# Patient Record
Sex: Female | Born: 1940 | Race: White | Hispanic: No | Smoking: Former smoker
Health system: Southern US, Community
[De-identification: ages and names within clinical notes are randomized; demographics above are authoritative.]

## PROBLEM LIST (undated history)

## (undated) DIAGNOSIS — R002 Palpitations: Secondary | ICD-10-CM

## (undated) DIAGNOSIS — J449 Chronic obstructive pulmonary disease, unspecified: Secondary | ICD-10-CM

## (undated) DIAGNOSIS — T7840XA Allergy, unspecified, initial encounter: Secondary | ICD-10-CM

## (undated) DIAGNOSIS — M8580 Other specified disorders of bone density and structure, unspecified site: Secondary | ICD-10-CM

## (undated) HISTORY — DX: Palpitations: R00.2

## (undated) HISTORY — DX: Chronic obstructive pulmonary disease, unspecified: J44.9

---

## 2005-02-06 ENCOUNTER — Encounter: Admission: RE | Admit: 2005-02-06 | Discharge: 2005-02-06 | Payer: Self-pay | Admitting: Internal Medicine

## 2005-03-30 ENCOUNTER — Ambulatory Visit (HOSPITAL_COMMUNITY): Admission: RE | Admit: 2005-03-30 | Discharge: 2005-03-30 | Payer: Self-pay | Admitting: Gastroenterology

## 2005-03-30 ENCOUNTER — Encounter (INDEPENDENT_AMBULATORY_CARE_PROVIDER_SITE_OTHER): Payer: Self-pay | Admitting: Specialist

## 2007-07-26 ENCOUNTER — Encounter: Admission: RE | Admit: 2007-07-26 | Discharge: 2007-07-26 | Payer: Self-pay | Admitting: Internal Medicine

## 2008-03-31 ENCOUNTER — Encounter: Admission: RE | Admit: 2008-03-31 | Discharge: 2008-03-31 | Payer: Self-pay | Admitting: Internal Medicine

## 2008-04-27 ENCOUNTER — Encounter: Admission: RE | Admit: 2008-04-27 | Discharge: 2008-04-27 | Payer: Self-pay | Admitting: Internal Medicine

## 2008-05-04 ENCOUNTER — Encounter: Admission: RE | Admit: 2008-05-04 | Discharge: 2008-05-04 | Payer: Self-pay | Admitting: Internal Medicine

## 2009-08-12 ENCOUNTER — Encounter: Admission: RE | Admit: 2009-08-12 | Discharge: 2009-08-12 | Payer: Self-pay | Admitting: Internal Medicine

## 2010-11-23 ENCOUNTER — Ambulatory Visit (HOSPITAL_COMMUNITY)
Admission: RE | Admit: 2010-11-23 | Discharge: 2010-11-23 | Payer: Self-pay | Source: Home / Self Care | Admitting: Internal Medicine

## 2011-01-09 ENCOUNTER — Encounter: Payer: Self-pay | Admitting: Internal Medicine

## 2011-03-22 ENCOUNTER — Encounter (HOSPITAL_COMMUNITY): Payer: Self-pay

## 2011-03-28 ENCOUNTER — Encounter (HOSPITAL_COMMUNITY): Payer: Self-pay

## 2011-03-31 ENCOUNTER — Encounter (HOSPITAL_COMMUNITY): Payer: Self-pay

## 2011-05-05 NOTE — Op Note (Signed)
[name], [name]            ACCOUNT NO.:  [id number]   MEDICAL RECORD NO.:  [id number]          PATIENT TYPE:  AMB   LOCATION:  ENDO                         FACILITY:  [facility]   PHYSICIAN:  Danise Edge, M.D.   DATE OF BIRTH:  1941-05-31   DATE OF PROCEDURE:  03/30/2005  DATE OF DISCHARGE:                                 OPERATIVE REPORT   PROCEDURE:  Colonoscopy and polypectomy.   INDICATIONS:  Ms. Virginia [name] is a 70 year old female, born Apr 27, 1941.  Virginia [name] is scheduled to undergo her first screening colonoscopy  with polypectomy to prevent colon cancer.   ENDOSCOPIST:  Dr. Reece Agar   PREMEDICATION:  1.  Versed 7 mg.  2.  Demerol 70 mg.   PROCEDURE:  After obtaining informed consent, Ms. Virginia [name] was placed in the  left lateral decubitus position.  I administered intravenous Demerol and  intravenous Versed to achieve conscious sedation for the procedure.  The  patient's blood pressure, oxygen saturation, and cardiac rhythm were  monitored throughout the procedure and documented in the medical record.   Anal inspection and digital rectal exam were normal.  The Olympus adjustable  pediatric colonoscope was introduced into the rectum and advanced to the  cecum.  A normal-appearing ileocecal valve and appendiceal orifice were  identified.  Colonic preparation exam today was excellent.   Rectum normal.  Retroflex view of the distal rectum reveals large,  nonbleeding internal hemorrhoids.  Sigmoid colon and descending colon.  At 30 cm from the anal verge, a 3 mm  sessile polyp was removed with the electrocautery snare.  Splenic flexure normal.  Transverse colon normal.  Hepatic flexure normal.  Ascending colon normal.  Cecum and ileocecal valve normal.   ASSESSMENT:  A small polyp was removed from the distal sigmoid colon;  otherwise, normal screening proctocolonoscopy to the cecum.   RECOMMENDATIONS:  If sigmoid polyp returns neoplastic  pathologically, [name] should undergo a repeat colonoscopy in 5 years.      MJ/MEDQ  D:  03/30/2005  T:  03/30/2005  Job:  [id number]   cc:   Wilson Singer, M.D.  [address]  [place]  [place] [address]  Fax: [phone]

## 2011-07-13 ENCOUNTER — Other Ambulatory Visit: Payer: Self-pay | Admitting: Internal Medicine

## 2011-08-16 ENCOUNTER — Ambulatory Visit
Admission: RE | Admit: 2011-08-16 | Discharge: 2011-08-16 | Disposition: A | Discharge status: Home / Self Care | Payer: Medicare Other | Source: Ambulatory Visit | Attending: Internal Medicine | Admitting: Internal Medicine

## 2012-03-12 ENCOUNTER — Emergency Department (HOSPITAL_COMMUNITY)
Admission: EM | Admit: 2012-03-12 | Discharge: 2012-03-12 | Disposition: A | Discharge status: Home / Self Care | Payer: Medicare Other | Source: Home / Self Care | Attending: Emergency Medicine | Admitting: Emergency Medicine

## 2012-03-12 ENCOUNTER — Encounter (HOSPITAL_COMMUNITY): Payer: Self-pay | Admitting: *Deleted

## 2012-03-12 DIAGNOSIS — H8309 Labyrinthitis, unspecified ear: Secondary | ICD-10-CM

## 2012-03-12 HISTORY — DX: Other specified disorders of bone density and structure, unspecified site: M85.80

## 2012-03-12 HISTORY — DX: Allergy, unspecified, initial encounter: T78.40XA

## 2012-03-12 MED ORDER — ONDANSETRON 4 MG PO TBDP
ORAL_TABLET | ORAL | Status: AC
  Filled 2012-03-12: qty 1

## 2012-03-12 MED ORDER — MECLIZINE HCL 12.5 MG PO TABS: 12.5000 mg | ORAL_TABLET | Freq: Three times a day (TID) | ORAL | Status: AC | PRN

## 2012-03-12 MED ORDER — ONDANSETRON HCL 4 MG PO TABS: 4.0000 mg | ORAL_TABLET | Freq: Three times a day (TID) | ORAL | Status: AC | PRN

## 2012-03-12 MED ORDER — ONDANSETRON 4 MG PO TBDP
4.0000 mg | ORAL_TABLET | Freq: Once | ORAL | Status: AC
  Administered 2012-03-12: 4 mg via ORAL

## 2012-03-12 NOTE — ED Notes (Signed)
Sudden onset at 1000 today pt reported dizziness, then 15 minutes later she started with N and V--4 times.  She denies fever.  She did have a loose stool today.

## 2012-03-12 NOTE — Discharge Instructions (Signed)
Labyrinthitis (Inner Ear Inflammation) Your exam shows you have an inner ear disturbance or labyrinthitis. The cause of this condition is not known. But it may be due to a virus infection. The symptoms of labyrinthitis include vertigo or dizziness made worse by motion, nausea and vomiting. The onset of labyrinthitis may be very sudden. It usually lasts for a few days and then clears up over 1-2 weeks. The treatment of an inner ear disturbance includes bed rest and medications to reduce dizziness, nausea, and vomiting. You should stay away from alcohol, tranquilizers, caffeine, nicotine, or any medicine your doctor thinks may make your symptoms worse. Further testing may be needed to evaluate your hearing and balance system. Please see your doctor or go to the emergency room right away if you have:  Increasing vertigo, earache, loss of hearing, or ear drainage.   Headache, blurred vision, trouble walking, fainting, or fever.   Persistent vomiting, dehydration, or extreme weakness.  Document Released: 12/04/2005 Document Revised: 11/23/2011 Document Reviewed: 05/22/2007 [employer] Patient Information 2012 [employer] 

## 2012-03-12 NOTE — ED Provider Notes (Signed)
Chief Complaint  Patient presents with  . Emesis    History of Present Illness:  Virginia [name] is a 71 year old female who has had a two-day history of continuous, spinning vertigo associated with nausea and vomiting. This is worse with certain positions or movements but is always there. 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 She denies any ear pain, ear congestion, difficulty hearing, ringing in ears. She's had no nasal congestion, although she does have some postnasal drainage. She denies headaches, diplopia, blurred vision, paresthesias, weakness, difficulty with speech, swallowing, or ambulation.  Review of Systems:  Other than noted above, the patient denies any of the following symptoms: Systemic:  No fever, chills, fatigue, or weight loss. Eye:  No blurred vision, visual change or diplopia. ENT:  No ear pain, tinnitus, hearing loss, nasal congestion, or rhinorrhea. Cardiac:  No chest pain, dyspnea, palpitations or syncope. Neuro:  No headache, paresthesias, weakness, trouble with speech, coordination or ambulation.  PMFSH:  Past medical history, family history, social history, meds, and allergies were reviewed.  Physical Exam:   Vital signs:  BP 116/82  Pulse 75  Temp(Src) 97.7 F (36.5 C) (Oral)  Resp 20  SpO2 96% General:  Alert, oriented times 3, in no distress. Eye:  PERRL, full EOM, no nystagmus. ENT:  TMs and canals normal.  Nasal mucosa normal.  Pharynx clear. Neck:  No adenopathy, tenderness, or mass.  Thyroid normal.  No carotid bruit. Lungs:  Breath sounds clear and equal bilaterally.  No wheezes, rales or rhonchi. Heart:  Regular rhythm.  No gallops, murmers, or rubs. Neuro:  Alert and oriented times 3.  Cranial nerves intact.  No pronator drift.  Finger to nose normal.  No focal weakness.  Sensation intact to light touch.  Romberg's sign negative, gait normal.  Able to do tandem gait well.    Course in Urgent Care Center:   She was given Zofran 4 mg  sublingually and tolerated this well.   Assessment:   Diagnoses that have been ruled out:  None  Diagnoses that are still under consideration:  None  Final diagnoses:  Labyrinthitis    Plan:   1.  The following meds were prescribed:   New Prescriptions   MECLIZINE (ANTIVERT) 12.5 MG TABLET    Take 1 tablet (12.5 mg total) by mouth 3 (three) times daily as needed.   ONDANSETRON (ZOFRAN) 4 MG TABLET    Take 1 tablet (4 mg total) by mouth every 8 (eight) hours as needed for nausea.   2.  The patient was instructed in symptomatic care and handouts were given. 3.  The patient was told to return if becoming worse in any way, if no better in 3 or 4 days, and given some red flag symptoms that would indicate earlier return.    Reuben Likes, MD 03/12/12 [phone]

## 2013-11-07 ENCOUNTER — Other Ambulatory Visit: Payer: Self-pay | Admitting: Internal Medicine

## 2013-11-07 DIAGNOSIS — M810 Age-related osteoporosis without current pathological fracture: Secondary | ICD-10-CM

## 2013-11-27 ENCOUNTER — Other Ambulatory Visit: Payer: Medicare Other

## 2014-01-12 ENCOUNTER — Ambulatory Visit
Admission: RE | Admit: 2014-01-12 | Discharge: 2014-01-12 | Disposition: A | Discharge status: Home / Self Care | Payer: Self-pay | Source: Ambulatory Visit | Attending: Internal Medicine | Admitting: Internal Medicine

## 2014-01-12 DIAGNOSIS — M810 Age-related osteoporosis without current pathological fracture: Secondary | ICD-10-CM

## 2015-08-12 ENCOUNTER — Other Ambulatory Visit: Payer: Self-pay | Admitting: Internal Medicine

## 2015-08-12 DIAGNOSIS — M810 Age-related osteoporosis without current pathological fracture: Secondary | ICD-10-CM

## 2015-09-30 ENCOUNTER — Other Ambulatory Visit: Payer: Self-pay

## 2015-10-08 ENCOUNTER — Ambulatory Visit (INDEPENDENT_AMBULATORY_CARE_PROVIDER_SITE_OTHER): Payer: Medicare Other | Admitting: Family Medicine

## 2015-10-08 VITALS — BP 156/82 | HR 80 | Temp 97.9°F | Resp 16 | Ht 65.0 in | Wt 108.4 lb

## 2015-10-08 DIAGNOSIS — R0602 Shortness of breath: Secondary | ICD-10-CM

## 2015-10-08 DIAGNOSIS — Z8709 Personal history of other diseases of the respiratory system: Secondary | ICD-10-CM | POA: Diagnosis not present

## 2015-10-08 DIAGNOSIS — Z87891 Personal history of nicotine dependence: Secondary | ICD-10-CM | POA: Diagnosis not present

## 2015-10-08 DIAGNOSIS — R059 Cough, unspecified: Secondary | ICD-10-CM

## 2015-10-08 DIAGNOSIS — R05 Cough: Secondary | ICD-10-CM | POA: Diagnosis not present

## 2015-10-08 MED ORDER — IPRATROPIUM BROMIDE 0.02 % IN SOLN
0.5000 mg | Freq: Once | RESPIRATORY_TRACT | Status: AC
  Administered 2015-10-08: 0.5 mg via RESPIRATORY_TRACT

## 2015-10-08 MED ORDER — ALBUTEROL SULFATE (2.5 MG/3ML) 0.083% IN NEBU
2.5000 mg | INHALATION_SOLUTION | Freq: Once | RESPIRATORY_TRACT | Status: AC
  Administered 2015-10-08: 2.5 mg via RESPIRATORY_TRACT

## 2015-10-08 MED ORDER — PREDNISONE 20 MG PO TABS: 40.0000 mg | ORAL_TABLET | Freq: Every day | ORAL | Status: DC

## 2015-10-08 NOTE — Patient Instructions (Signed)
You appear to have an early viral illness or upper respiratory infection that has flared up your asthma or possible flair of chronic bronchitis. Prednisone for 3 days to help control wheeze and improve breathing.   Ok to use albuterol every 4 hours as needed for next day or two, but if continued need of albuterol every 4 hours in 2 days, or you are short of breath or wheezing prior to 4 hours - return here or emergency room.   Follow up with you primary care doctor next week for recheck of your lung symptoms and to discuss albuterol and whether you need other daily medication or further testing for asthma or "COPD".   Return to the clinic or go to the nearest emergency room if any of your symptoms worsen or new symptoms occur.  Upper Respiratory Infection, Adult Most upper respiratory infections (URIs) are a viral infection of the air passages leading to the lungs. A URI affects the nose, throat, and upper air passages. The most common type of URI is nasopharyngitis and is typically referred to as "the common cold." URIs run their course and usually go away on their own. Most of the time, a URI does not require medical attention, but sometimes a bacterial infection in the upper airways can follow a viral infection. This is called a secondary infection. Sinus and middle ear infections are common types of secondary upper respiratory infections. Bacterial pneumonia can also complicate a URI. A URI can worsen asthma and chronic obstructive pulmonary disease (COPD). Sometimes, these complications can require emergency medical care and may be life threatening.  CAUSES Almost all URIs are caused by viruses. A virus is a type of germ and can spread from one person to another.  RISKS FACTORS You may be at risk for a URI if:   You smoke.   You have chronic heart or lung disease.  You have a weakened defense (immune) system.   You are very young or very old.   You have nasal allergies or  asthma.  You work in crowded or poorly ventilated areas.  You work in health care facilities or schools. SIGNS AND SYMPTOMS  Symptoms typically develop 2-3 days after you come in contact with a cold virus. Most viral URIs last 7-10 days. However, viral URIs from the influenza virus (flu virus) can last 14-18 days and are typically more severe. Symptoms may include:   Runny or stuffy (congested) nose.   Sneezing.   Cough.   Sore throat.   Headache.   Fatigue.   Fever.   Loss of appetite.   Pain in your forehead, behind your eyes, and over your cheekbones (sinus pain).  Muscle aches.  DIAGNOSIS  Your health care provider may diagnose a URI by:  Physical exam.  Tests to check that your symptoms are not due to another condition such as:  Strep throat.  Sinusitis.  Pneumonia.  Asthma. TREATMENT  A URI goes away on its own with time. It cannot be cured with medicines, but medicines may be prescribed or recommended to relieve symptoms. Medicines may help:  Reduce your fever.  Reduce your cough.  Relieve nasal congestion. HOME CARE INSTRUCTIONS   Take medicines only as directed by your health care provider.   Gargle warm saltwater or take cough drops to comfort your throat as directed by your health care provider.  Use a warm mist humidifier or inhale steam from a shower to increase air moisture. This may make it easier to breathe.  Drink enough fluid to keep your urine clear or pale yellow.   Eat soups and other clear broths and maintain good nutrition.   Rest as needed.   Return to work when your temperature has returned to normal or as your health care provider advises. You may need to stay home longer to avoid infecting others. You can also use a face mask and careful hand washing to prevent spread of the virus.  Increase the usage of your inhaler if you have asthma.   Do not use any tobacco products, including cigarettes, chewing tobacco,  or electronic cigarettes. If you need help quitting, ask your health care provider. PREVENTION  The best way to protect yourself from getting a cold is to practice good hygiene.   Avoid oral or hand contact with people with cold symptoms.   Wash your hands often if contact occurs.  There is no clear evidence that vitamin C, vitamin E, echinacea, or exercise reduces the chance of developing a cold. However, it is always recommended to get plenty of rest, exercise, and practice good nutrition.  SEEK MEDICAL CARE IF:   You are getting worse rather than better.   Your symptoms are not controlled by medicine.   You have chills.  You have worsening shortness of breath.  You have brown or red mucus.  You have yellow or brown nasal discharge.  You have pain in your face, especially when you bend forward.  You have a fever.  You have swollen neck glands.  You have pain while swallowing.  You have white areas in the back of your throat. SEEK IMMEDIATE MEDICAL CARE IF:   You have severe or persistent:  Headache.  Ear pain.  Sinus pain.  Chest pain.  You have chronic lung disease and any of the following:  Wheezing.  Prolonged cough.  Coughing up blood.  A change in your usual mucus.  You have a stiff neck.  You have changes in your:  Vision.  Hearing.  Thinking.  Mood. MAKE SURE YOU:   Understand these instructions.  Will watch your condition.  Will get help right away if you are not doing well or get worse.   This information is not intended to replace advice given to you by your health care provider. Make sure you discuss any questions you have with your health care provider.   Document Released: 05/30/2001 Document Revised: 04/20/2015 Document Reviewed: 03/11/2014 Elsevier Interactive Patient Education 2016 [employer]   Asthma, Acute Bronchospasm Acute bronchospasm caused by asthma is also referred to as an asthma attack. Bronchospasm  means your air passages become narrowed. The narrowing is caused by inflammation and tightening of the muscles in the air tubes (bronchi) in your lungs. This can make it hard to breathe or cause you to wheeze and cough. CAUSES Possible triggers are:  Animal dander from the skin, hair, or feathers of animals.  Dust mites contained in house dust.  Cockroaches.  Pollen from trees or grass.  Mold.  Cigarette or tobacco smoke.  Air pollutants such as dust, household cleaners, hair sprays, aerosol sprays, paint fumes, strong chemicals, or strong odors.  Cold air or weather changes. Cold air may trigger inflammation. Winds increase molds and pollens in the air.  Strong emotions such as crying or laughing hard.  Stress.  Certain medicines such as aspirin or beta-blockers.  Sulfites in foods and drinks, such as dried fruits and wine.  Infections or inflammatory conditions, such as a flu, cold, or inflammation  of the nasal membranes (rhinitis).  Gastroesophageal reflux disease (GERD). GERD is a condition where stomach acid backs up into your esophagus.  Exercise or strenuous activity. SIGNS AND SYMPTOMS   Wheezing.  Excessive coughing, particularly at night.  Chest tightness.  Shortness of breath. DIAGNOSIS  Your health care provider will ask you about your medical history and perform a physical exam. A chest X-ray or blood testing may be performed to look for other causes of your symptoms or other conditions that may have triggered your asthma attack. TREATMENT  Treatment is aimed at reducing inflammation and opening up the airways in your lungs. Most asthma attacks are treated with inhaled medicines. These include quick relief or rescue medicines (such as bronchodilators) and controller medicines (such as inhaled corticosteroids). These medicines are sometimes given through an inhaler or a nebulizer. Systemic steroid medicine taken by mouth or given through an IV tube also can  be used to reduce the inflammation when an attack is moderate or severe. Antibiotic medicines are only used if a bacterial infection is present.  HOME CARE INSTRUCTIONS   Rest.  Drink plenty of liquids. This helps the mucus to remain thin and be easily coughed up. Only use caffeine in moderation and do not use alcohol until you have recovered from your illness.  Do not smoke. Avoid being exposed to secondhand smoke.  You play a critical role in keeping yourself in good health. Avoid exposure to things that cause you to wheeze or to have breathing problems.  Keep your medicines up-to-date and available. Carefully follow your health care provider's treatment plan.  Take your medicine exactly as prescribed.  When pollen or pollution is bad, keep windows closed and use an air conditioner or go to places with air conditioning.  Asthma requires careful medical care. See your health care provider for a follow-up as advised. If you are more than [redacted] weeks pregnant and you were prescribed any new medicines, let your obstetrician know about the visit and how you are doing. Follow up with your health care provider as directed.  After you have recovered from your asthma attack, make an appointment with your outpatient doctor to talk about ways to reduce the likelihood of future attacks. If you do not have a doctor who manages your asthma, make an appointment with a primary care doctor to discuss your asthma. SEEK IMMEDIATE MEDICAL CARE IF:   You are getting worse.  You have trouble breathing. If severe, call your local emergency services (911 in the [place]  You develop chest pain or discomfort.  You are vomiting.  You are not able to keep fluids down.  You are coughing up yellow, green, brown, or bloody sputum.  You have a fever and your symptoms suddenly get worse.  You have trouble swallowing. MAKE SURE YOU:   Understand these instructions.  Will watch your condition.  Will get help  right away if you are not doing well or get worse.   This information is not intended to replace advice given to you by your health care provider. Make sure you discuss any questions you have with your health care provider.   Document Released: 03/21/2007 Document Revised: 12/09/2013 Document Reviewed: 06/11/2013 Elsevier Interactive Patient Education [employer]

## 2015-10-08 NOTE — Progress Notes (Addendum)
Subjective:    Patient ID: Virginia [name], female    DOB: 16-Oct-1941, 74 y.o.   MRN: [id number] This chart was scribed for Virginia StaggersJeffrey Carrington Olazabal, MD by Littie Deedsichard Sun, Medical Scribe. This patient was seen in Room 4 and the patient's care was started at 12:28 PM.    HPI HPI Comments: Virginia [name] is a 74 y.o. female with a history of chronic bronchitis who presents to the [facility] complaining of chest tightness that started last night. Patient reports having associated rhinorrhea and congestion. She also reports having some SOB when she woke up this morning. She used her albuterol inhaler once a few hours ago which did provide relief. She estimates using her inhaler about 15-20 times a week for SOB, usually when she has increased activity. Patient denies fever, cough, chest pain, wheezing, headache, weakness, slurred speech, and stroke-like symptoms. She does have a history of smoking (60 pack-years, 2 ppd for about 30 years) but quit 13 years ago. Her FMHx includes asthma in multiple family members. Patient notes that she is typically treated by her PCP with prednisone and Z-pak for bronchitis flare-ups, last treated 6-7 years ago. She notes her blood pressure is normally around 120/78.  PCP: Dr. Ludwig ClarksMoreira  There are no active problems to display for this patient.  Past Medical History  Diagnosis Date  . Osteopenia   . Asthma   . Allergic    History reviewed. No pertinent past surgical history. Allergies  Allergen Reactions  . Aspartame And Phenylalanine Hives  . Chocolate Diarrhea  . Gelatin Itching    Any gel caps  . Sudafed [Pseudoephedrine Hcl] Anxiety    Makes her "drunk"   Prior to Admission medications   Medication Sig Start Date End Date Taking? Authorizing Provider  albuterol (PROVENTIL) (2.5 MG/3ML) 0.083% nebulizer solution Take 2.5 mg by nebulization every 6 (six) hours as needed.   Yes Historical Provider, MD  fexofenadine (ALLEGRA) 30 MG tablet Take 30  mg by mouth 2 (two) times daily.   Yes Historical Provider, MD   Social History   Social History  . Marital Status: Divorced    Spouse Name: N/A  . Number of Children: N/A  . Years of Education: N/A   Occupational History  . Not on file.   Social History Main Topics  . Smoking status: Former Smoker    Quit date: 07/12/2002  . Smokeless tobacco: Not on file  . Alcohol Use: No  . Drug Use: No  . Sexual Activity: Not on file   Other Topics Concern  . Not on file   Social History Narrative     Review of Systems  Constitutional: Negative for fever.  HENT: Positive for congestion and rhinorrhea.   Respiratory: Positive for chest tightness and shortness of breath. Negative for cough and wheezing.   Neurological: Negative for speech difficulty, weakness and headaches.       Objective:   Physical Exam  Constitutional: She is oriented to person, place, and time. She appears well-developed and well-nourished. No distress.  HENT:  Head: Normocephalic and atraumatic.  Mouth/Throat: Oropharynx is clear and moist. No oropharyngeal exudate.  Eyes: Pupils are equal, round, and reactive to light.  Neck: Neck supple.  Cardiovascular: Normal rate.   Pulmonary/Chest: Effort normal. No respiratory distress.  Distant breath sounds. No audible wheeze but distant sounds.  Musculoskeletal: She exhibits no edema.  Scoliosis/kyphosis of thoracic spine.  Neurological: She is alert and oriented to person, place, and time. No  cranial nerve deficit.  Skin: Skin is warm and dry. No rash noted.  Psychiatric: She has a normal mood and affect. Her behavior is normal.  Nursing note and vitals reviewed.  1:24 PM - She had an albuterol and Atrovent neb - improved aeration, less tightness/dyspnea. No wheeze - clear on exam.    Filed Vitals:   10/08/15 1200 10/08/15 1203  BP: 160/80 156/82  Pulse: 80   Temp: 97.9 F (36.6 C)   TempSrc: Oral   Resp: 16   Height:  (1.651 m)   Weight: 108  lb 6.4 oz (49.17 kg)   SpO2: 91%        Assessment & Plan:   Virginia [name] is a 74 y.o. female Shortness of breath - Plan: albuterol (PROVENTIL) (2.5 MG/3ML) 0.083% nebulizer solution 2.5 mg, ipratropium (ATROVENT) nebulizer solution 0.5 mg, predniSONE (DELTASONE) 20 MG tablet  Cough - Plan: albuterol (PROVENTIL) (2.5 MG/3ML) 0.083% nebulizer solution 2.5 mg, ipratropium (ATROVENT) nebulizer solution 0.5 mg, predniSONE (DELTASONE) 20 MG tablet  Hx of chronic bronchitis - Plan: albuterol (PROVENTIL) (2.5 MG/3ML) 0.083% nebulizer solution 2.5 mg, ipratropium (ATROVENT) nebulizer solution 0.5 mg, predniSONE (DELTASONE) 20 MG tablet  History of tobacco abuse   Suspected URI/viral illness with flare of either asthma or possible chronic bronchitis/COPD with her smoking history. Improved with albuterol and atrovent neb.   - Due to frequent use of albuterol, decided on short course of prednisone -  QD for 3 days, and RTC/ER precautions given.   -recommended follow up with PCP to discuss medication as possibly using albuterol up t o 15-20 times per week.  Discussed may need inhaled corticosteroid or possible Spiriva.      Meds ordered this encounter  Medications  . fexofenadine (ALLEGRA) 30 MG tablet    Sig: Take 30 mg by mouth 2 (two) times daily.  Marland Kitchen albuterol (PROVENTIL) (2.5 MG/3ML) 0.083% nebulizer solution 2.5 mg    Sig:   . ipratropium (ATROVENT) nebulizer solution 0.5 mg    Sig:   . predniSONE (DELTASONE) 20 MG tablet    Sig: Take 2 tablets (40 mg total) by mouth daily with breakfast.    Dispense:  6 tablet    Refill:  0   Patient Instructions  You appear to have an early viral illness or upper respiratory infection that has flared up your asthma or possible flair of chronic bronchitis. Prednisone for 3 days to help control wheeze and improve breathing.   Ok to use albuterol every 4 hours as needed for next day or two, but if continued need of albuterol every 4 hours in 2  days, or you are short of breath or wheezing prior to 4 hours - return here or emergency room.   Follow up with you primary care doctor next week for recheck of your lung symptoms and to discuss albuterol and whether you need other daily medication or further testing for asthma or "COPD".   Return to the clinic or go to the nearest emergency room if any of your symptoms worsen or new symptoms occur.  Upper Respiratory Infection, Adult Most upper respiratory infections (URIs) are a viral infection of the air passages leading to the lungs. A URI affects the nose, throat, and upper air passages. The most common type of URI is nasopharyngitis and is typically referred to as "the common cold." URIs run their course and usually go away on their own. Most of the time, a URI does not require medical attention, but sometimes  a bacterial infection in the upper airways can follow a viral infection. This is called a secondary infection. Sinus and middle ear infections are common types of secondary upper respiratory infections. Bacterial pneumonia can also complicate a URI. A URI can worsen asthma and chronic obstructive pulmonary disease (COPD). Sometimes, these complications can require emergency medical care and may be life threatening.  CAUSES Almost all URIs are caused by viruses. A virus is a type of germ and can spread from one person to another.  RISKS FACTORS You may be at risk for a URI if:   You smoke.   You have chronic heart or lung disease.  You have a weakened defense (immune) system.   You are very young or very old.   You have nasal allergies or asthma.  You work in crowded or poorly ventilated areas.  You work in health care facilities or schools. SIGNS AND SYMPTOMS  Symptoms typically develop 2-3 days after you come in contact with a cold virus. Most viral URIs last 7-10 days. However, viral URIs from the influenza virus (flu virus) can last 14-18 days and are typically more  severe. Symptoms may include:   Runny or stuffy (congested) nose.   Sneezing.   Cough.   Sore throat.   Headache.   Fatigue.   Fever.   Loss of appetite.   Pain in your forehead, behind your eyes, and over your cheekbones (sinus pain).  Muscle aches.  DIAGNOSIS  Your health care provider may diagnose a URI by:  Physical exam.  Tests to check that your symptoms are not due to another condition such as:  Strep throat.  Sinusitis.  Pneumonia.  Asthma. TREATMENT  A URI goes away on its own with time. It cannot be cured with medicines, but medicines may be prescribed or recommended to relieve symptoms. Medicines may help:  Reduce your fever.  Reduce your cough.  Relieve nasal congestion. HOME CARE INSTRUCTIONS   Take medicines only as directed by your health care provider.   Gargle warm saltwater or take cough drops to comfort your throat as directed by your health care provider.  Use a warm mist humidifier or inhale steam from a shower to increase air moisture. This may make it easier to breathe.  Drink enough fluid to keep your urine clear or pale yellow.   Eat soups and other clear broths and maintain good nutrition.   Rest as needed.   Return to work when your temperature has returned to normal or as your health care provider advises. You may need to stay home longer to avoid infecting others. You can also use a face mask and careful hand washing to prevent spread of the virus.  Increase the usage of your inhaler if you have asthma.   Do not use any tobacco products, including cigarettes, chewing tobacco, or electronic cigarettes. If you need help quitting, ask your health care provider. PREVENTION  The best way to protect yourself from getting a cold is to practice good hygiene.   Avoid oral or hand contact with people with cold symptoms.   Wash your hands often if contact occurs.  There is no clear evidence that vitamin C, vitamin  E, echinacea, or exercise reduces the chance of developing a cold. However, it is always recommended to get plenty of rest, exercise, and practice good nutrition.  SEEK MEDICAL CARE IF:   You are getting worse rather than better.   Your symptoms are not controlled by medicine.  You have chills.  You have worsening shortness of breath.  You have brown or red mucus.  You have yellow or brown nasal discharge.  You have pain in your face, especially when you bend forward.  You have a fever.  You have swollen neck glands.  You have pain while swallowing.  You have white areas in the back of your throat. SEEK IMMEDIATE MEDICAL CARE IF:   You have severe or persistent:  Headache.  Ear pain.  Sinus pain.  Chest pain.  You have chronic lung disease and any of the following:  Wheezing.  Prolonged cough.  Coughing up blood.  A change in your usual mucus.  You have a stiff neck.  You have changes in your:  Vision.  Hearing.  Thinking.  Mood. MAKE SURE YOU:   Understand these instructions.  Will watch your condition.  Will get help right away if you are not doing well or get worse.   This information is not intended to replace advice given to you by your health care provider. Make sure you discuss any questions you have with your health care provider.   Document Released: 05/30/2001 Document Revised: 04/20/2015 Document Reviewed: 03/11/2014 Elsevier Interactive Patient Education 2016 Elsevier Inc.   Asthma, Acute Bronchospasm Acute bronchospasm caused by asthma is also referred to as an asthma attack. Bronchospasm means your air passages become narrowed. The narrowing is caused by inflammation and tightening of the muscles in the air tubes (bronchi) in your lungs. This can make it hard to breathe or cause you to wheeze and cough. CAUSES Possible triggers are:  Animal dander from the skin, hair, or feathers of animals.  Dust mites contained in house  dust.  Cockroaches.  Pollen from trees or grass.  Mold.  Cigarette or tobacco smoke.  Air pollutants such as dust, household cleaners, hair sprays, aerosol sprays, paint fumes, strong chemicals, or strong odors.  Cold air or weather changes. Cold air may trigger inflammation. Winds increase molds and pollens in the air.  Strong emotions such as crying or laughing hard.  Stress.  Certain medicines such as aspirin or beta-blockers.  Sulfites in foods and drinks, such as dried fruits and wine.  Infections or inflammatory conditions, such as a flu, cold, or inflammation of the nasal membranes (rhinitis).  Gastroesophageal reflux disease (GERD). GERD is a condition where stomach acid backs up into your esophagus.  Exercise or strenuous activity. SIGNS AND SYMPTOMS   Wheezing.  Excessive coughing, particularly at night.  Chest tightness.  Shortness of breath. DIAGNOSIS  Your health care provider will ask you about your medical history and perform a physical exam. A chest X-ray or blood testing may be performed to look for other causes of your symptoms or other conditions that may have triggered your asthma attack. TREATMENT  Treatment is aimed at reducing inflammation and opening up the airways in your lungs. Most asthma attacks are treated with inhaled medicines. These include quick relief or rescue medicines (such as bronchodilators) and controller medicines (such as inhaled corticosteroids). These medicines are sometimes given through an inhaler or a nebulizer. Systemic steroid medicine taken by mouth or given through an IV tube also can be used to reduce the inflammation when an attack is moderate or severe. Antibiotic medicines are only used if a bacterial infection is present.  HOME CARE INSTRUCTIONS   Rest.  Drink plenty of liquids. This helps the mucus to remain thin and be easily coughed up. Only use caffeine in moderation and  do not use alcohol until you have  recovered from your illness.  Do not smoke. Avoid being exposed to secondhand smoke.  You play a critical role in keeping yourself in good health. Avoid exposure to things that cause you to wheeze or to have breathing problems.  Keep your medicines up-to-date and available. Carefully follow your health care provider's treatment plan.  Take your medicine exactly as prescribed.  When pollen or pollution is bad, keep windows closed and use an air conditioner or go to places with air conditioning.  Asthma requires careful medical care. See your health care provider for a follow-up as advised. If you are more than [redacted] weeks pregnant and you were prescribed any new medicines, let your obstetrician know about the visit and how you are doing. Follow up with your health care provider as directed.  After you have recovered from your asthma attack, make an appointment with your outpatient doctor to talk about ways to reduce the likelihood of future attacks. If you do not have a doctor who manages your asthma, make an appointment with a primary care doctor to discuss your asthma. SEEK IMMEDIATE MEDICAL CARE IF:   You are getting worse.  You have trouble breathing. If severe, call your local emergency services (911 in the [place]  You develop chest pain or discomfort.  You are vomiting.  You are not able to keep fluids down.  You are coughing up yellow, green, brown, or bloody sputum.  You have a fever and your symptoms suddenly get worse.  You have trouble swallowing. MAKE SURE YOU:   Understand these instructions.  Will watch your condition.  Will get help right away if you are not doing well or get worse.   This information is not intended to replace advice given to you by your health care provider. Make sure you discuss any questions you have with your health care provider.   Document Released: 03/21/2007 Document Revised: 12/09/2013 Document Reviewed: 06/11/2013 Elsevier Interactive  Patient Education [employer]         I personally performed the services described in this documentation, which was scribed in my presence. The recorded information has been reviewed and considered, and addended by me as needed.    By signing my name below, I, Littie Deeds, attest that this documentation has been prepared under the direction and in the presence of Virginia Staggers, MD.  Electronically Signed: Littie Deeds, Medical Scribe. 10/08/2015. 12:28 PM.

## 2015-10-27 ENCOUNTER — Ambulatory Visit
Admission: RE | Admit: 2015-10-27 | Discharge: 2015-10-27 | Disposition: A | Discharge status: Home / Self Care | Payer: Medicare Other | Source: Ambulatory Visit | Attending: Internal Medicine | Admitting: Internal Medicine

## 2015-10-27 DIAGNOSIS — M810 Age-related osteoporosis without current pathological fracture: Secondary | ICD-10-CM

## 2016-08-12 ENCOUNTER — Ambulatory Visit (INDEPENDENT_AMBULATORY_CARE_PROVIDER_SITE_OTHER): Payer: Medicare Other | Admitting: Physician Assistant

## 2016-08-12 VITALS — BP 90/64 | HR 72 | Temp 97.8°F | Resp 16 | Ht 65.0 in | Wt 113.0 lb

## 2016-08-12 DIAGNOSIS — S60811A Abrasion of right wrist, initial encounter: Secondary | ICD-10-CM | POA: Diagnosis not present

## 2016-08-12 NOTE — Patient Instructions (Addendum)
Please wash the wound gently every 12 hours and allow the wound to air dry for 1 hour after washing.  Please apply bacitracin and gauze after air drying. If you develop worsening pain, redness, or start to have fever, chills, nausea then please come back to clinic for recheck.      IF you received an x-ray today, you will receive an invoice from [facility]. Please contact [facility] at [phone] with questions or concerns regarding your invoice.   IF you received labwork today, you will receive an invoice from [facility]. Please contact [facility] at [phone] with questions or concerns regarding your invoice.   Our billing staff will not be able to assist you with questions regarding bills from these companies.  You will be contacted with the lab results as soon as they are available. The fastest way to get your results is to activate your My Chart account. Instructions are located on the last page of this paperwork. If you have not heard from us regarding the results in 2 weeks, please contact this office.

## 2016-08-12 NOTE — Progress Notes (Signed)
   08/12/2016 1:51 PM   DOB: 16-Apr-1941 / MRN: [id number]  SUBJECTIVE:  Virginia [name] is a 75 y.o. female presenting for "a cut" the right medial wrist just over the distal ulna.  She did this yesterday while walking by a counter top.  She denies joint pain, swelling, and changes in sensation and function of the wrist and hand.   She is allergic to aspartame and phenylalanine; chocolate; gelatin; adhesive [tape]; and sudafed [pseudoephedrine hcl].   She  has a past medical history of Allergic; Asthma; and Osteopenia.    She  reports that she quit smoking about 14 years ago. She does not have any smokeless tobacco history on file. She reports that she does not drink alcohol or use drugs. She  has no sexual activity history on file. The patient  has no past surgical history on file.  Her family history includes Asthma in her father.  Review of Systems  Constitutional: Negative for chills and fever.  Respiratory: Negative for cough.   Cardiovascular: Negative for chest pain.  Skin: Negative for itching and rash.  Neurological: Negative for dizziness and headaches.    The problem list and medications were reviewed and updated by myself where necessary and exist elsewhere in the encounter.   OBJECTIVE:  BP 90/64   Pulse 72   Temp 97.8 F (36.6 C) (Oral)   Resp 16   Ht 5\' 5"  (1.651 m)   Wt 113 lb (51.3 kg)   SpO2 98%   BMI 18.80 kg/m   Physical Exam  Constitutional: She is oriented to person, place, and time. She appears well-developed and well-nourished. No distress.  HENT:  Mouth/Throat: No oropharyngeal exudate.  Cardiovascular: Normal rate and regular rhythm.   Pulmonary/Chest: Effort normal and breath sounds normal.  Musculoskeletal:       Hands: Neurological: She is alert and oriented to person, place, and time.  Skin: Skin is warm and dry. She is not diaphoretic.  Psychiatric: She has a normal mood and affect.    No results found for this or any previous visit  (from the past 72 hour(s)).  No results found.  ASSESSMENT AND PLAN  Virginia [name] was seen today for wrist injury.  Diagnoses and all orders for this visit:  Abrasion of wrist, right, initial encounter: See exam.  Wound care advised on AVS and patient with good understanding.      The patient is advised to call or return to clinic if she does not see an improvement in symptoms, or to seek the care of the closest emergency department if she worsens with the above plan.   Deliah BostonMichael Clark, MHS, PA-C [facility] 08/12/2016 1:51 PM

## 2016-08-13 ENCOUNTER — Telehealth: Payer: Self-pay | Admitting: Emergency Medicine

## 2016-08-13 NOTE — Telephone Encounter (Signed)
Patient with allergic reaction on arms. Hx of allergies. Good response to pred in past. Rx sent to [facility]. Pred 20,20,20,15,15,15,10.10,10,5,5,5.

## 2016-08-14 NOTE — Telephone Encounter (Signed)
Patient called regarding problems with an allergic rash both forearms. A prescription was called in for prednisone.

## 2017-05-16 ENCOUNTER — Encounter (INDEPENDENT_AMBULATORY_CARE_PROVIDER_SITE_OTHER): Payer: Self-pay | Admitting: Orthopedic Surgery

## 2017-05-16 ENCOUNTER — Ambulatory Visit (INDEPENDENT_AMBULATORY_CARE_PROVIDER_SITE_OTHER): Payer: Medicare Other

## 2017-05-16 ENCOUNTER — Ambulatory Visit (INDEPENDENT_AMBULATORY_CARE_PROVIDER_SITE_OTHER): Payer: Medicare Other | Admitting: Orthopedic Surgery

## 2017-05-16 DIAGNOSIS — M542 Cervicalgia: Secondary | ICD-10-CM | POA: Diagnosis not present

## 2017-05-16 DIAGNOSIS — M25512 Pain in left shoulder: Secondary | ICD-10-CM | POA: Diagnosis not present

## 2017-05-16 NOTE — Progress Notes (Signed)
Office Visit Note   Patient: Virginia [name]           Date of Birth: 07-23-1941           MRN: [id number] Visit Date: 05/16/2017 Requested by: Ralene Ok, MD [id number] [place], [place] [address] PCP: Ralene Ok, MD  Subjective: Chief Complaint  Patient presents with  . Left Shoulder - Pain  . Neck - Pain    HPI: Virginia [name] is an active 76 year old patient who noticed a left shoulder lump 2 months ago.  It is not painful.  She usually sleeps on the left-hand side.  It has started to increase in size.  She denies any injury or shoulder pain specifically.              ROS: All systems reviewed are negative as they relate to the chief complaint within the history of present illness.  Patient denies  fevers or chills.   Assessment & Plan: Visit Diagnoses:  1. Left shoulder pain, unspecified chronicity   2. Cervicalgia     Plan: Impression is left shoulder mass which is cystic on examination and does have a fluid-filled component upon ultrasound examination.  There is some rounded calcification present on the plain radiographs.  The calcification I think his reason for further imaging studies to evaluate this calcified mass.  We'll see what that shows.  In the meantime there is no activity restriction.  Differential diagnosis includes calcification within the cystic mass off of the acromioclavicular joint versus some type of Cystic and solid mass with calcium.  Also see her back after that study  Follow-Up Instructions: No Follow-up on file.   Orders:  Orders Placed This Encounter  Procedures  . XR Shoulder Left  . XR Cervical Spine 2 or 3 views   No orders of the defined types were placed in this encounter.     Procedures: No procedures performed   Clinical Data: No additional findings.  Objective: Vital Signs: There were no vitals taken for this visit.  Physical Exam:   Constitutional: Patient appears well-developed HEENT:  Head: Normocephalic Eyes:EOM are  normal Neck: Normal range of motion Cardiovascular: Normal rate Pulmonary/chest: Effort normal Neurologic: Patient is alert Skin: Skin is warm Psychiatric: Patient has normal mood and affect    Ortho Exam: Orthopedic exam demonstrates full active and passive range of motion of the left shoulder 5 out of 5 grip EPL FPL interosseous wrist flexion-extension biceps triceps and deltoid strength.  She does have a cystic mass just anterior to the coracoid.  This freely mobile.  Nontender to palpation.  Acromioclavicular joint is nontender.  There is no surrounding lymphadenopathy.  No skin changes associated with this mass which measures about 3 x 4 cm  Specialty Comments:  No specialty comments available.  Imaging: Xr Cervical Spine 2 Or 3 Views  Result Date: 05/16/2017 AP lateral cervical spine reviewed.  Normal lordosis is present.  Bones are osteopenic.  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  Facet arthritis is present at C5-6.  Spinous process alignment normal.  Thoracic scoliosis is present.  There is some calcification around the aorta.  Xr Shoulder Left  Result Date: 05/16/2017 AP and axillary lateral and outlet view left shoulder reviewed.  Calcification is noted anterior to the coracoid process.  This is well circumscribed.  Glenohumeral joint is reduced.  No acromioclavicular joint arthritis is present.  Breast implant is noted.    PMFS History: There are no active problems to display for  this patient.  Past Medical History:  Diagnosis Date  . Allergic   . Asthma   . Osteopenia     Family History  Problem Relation Age of Onset  . Asthma Father     No past surgical history on file. Social History   Occupational History  . Not on file.   Social History Main Topics  . Smoking status: Former Smoker    Quit date: 07/12/2002  . Smokeless tobacco: Never Used  . Alcohol use No  . Drug use: No  . Sexual activity: Not on file

## 2017-06-06 ENCOUNTER — Ambulatory Visit
Admission: RE | Admit: 2017-06-06 | Discharge: 2017-06-06 | Disposition: A | Discharge status: Home / Self Care | Payer: Medicare Other | Source: Ambulatory Visit | Attending: Orthopedic Surgery | Admitting: Orthopedic Surgery

## 2017-06-06 DIAGNOSIS — M25512 Pain in left shoulder: Secondary | ICD-10-CM

## 2017-06-06 MED ORDER — IOPAMIDOL (ISOVUE-M 200) INJECTION 41%
15.0000 mL | Freq: Once | INTRAMUSCULAR | Status: AC
  Administered 2017-06-06: 15 mL via INTRA_ARTICULAR

## 2017-06-07 ENCOUNTER — Telehealth (INDEPENDENT_AMBULATORY_CARE_PROVIDER_SITE_OTHER): Payer: Self-pay | Admitting: Orthopedic Surgery

## 2017-06-07 NOTE — Telephone Encounter (Signed)
Patient called asked if she can get a call concerning her MRI results. The number to contact patient is [phone]

## 2017-06-07 NOTE — Telephone Encounter (Signed)
Patient wants MRI results that she had done yesterday.

## 2017-06-11 ENCOUNTER — Telehealth (INDEPENDENT_AMBULATORY_CARE_PROVIDER_SITE_OTHER): Payer: Self-pay | Admitting: Orthopedic Surgery

## 2017-06-11 NOTE — Telephone Encounter (Signed)
Patient called wanting to know if she could find out the results of her MRI over the phone. Asked if she could come in tomorrow for her review and she stated she couldn't because she is playing golf. She's just worried about possibly having surgery. CB # [phone]

## 2017-06-11 NOTE — Telephone Encounter (Signed)
I calledI called and reviewed the scan with the patient.  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  Plan is for review clinically in November.  Please make an appointment for her for November.  An please cancel her appointment for Thursday thanks

## 2017-06-11 NOTE — Telephone Encounter (Signed)
See note from patient. Requesting MRI results.

## 2017-06-12 NOTE — Telephone Encounter (Signed)
IC s/w patient and appt is schedule.

## 2017-06-14 ENCOUNTER — Ambulatory Visit (INDEPENDENT_AMBULATORY_CARE_PROVIDER_SITE_OTHER): Payer: Medicare Other | Admitting: Orthopedic Surgery

## 2017-10-31 ENCOUNTER — Ambulatory Visit (INDEPENDENT_AMBULATORY_CARE_PROVIDER_SITE_OTHER): Payer: Medicare Other | Admitting: Orthopedic Surgery

## 2017-11-29 ENCOUNTER — Other Ambulatory Visit: Payer: Self-pay | Admitting: Internal Medicine

## 2017-11-29 DIAGNOSIS — M810 Age-related osteoporosis without current pathological fracture: Secondary | ICD-10-CM

## 2017-12-21 ENCOUNTER — Other Ambulatory Visit: Payer: Medicare Other

## 2018-01-31 ENCOUNTER — Ambulatory Visit
Admission: RE | Admit: 2018-01-31 | Discharge: 2018-01-31 | Disposition: A | Discharge status: Home / Self Care | Payer: Medicare Other | Source: Ambulatory Visit | Attending: Internal Medicine | Admitting: Internal Medicine

## 2018-01-31 DIAGNOSIS — M810 Age-related osteoporosis without current pathological fracture: Secondary | ICD-10-CM

## 2018-07-17 ENCOUNTER — Other Ambulatory Visit: Payer: Self-pay | Admitting: Ophthalmology

## 2020-01-07 ENCOUNTER — Ambulatory Visit: Payer: Medicare Other | Attending: Internal Medicine

## 2020-01-07 DIAGNOSIS — Z23 Encounter for immunization: Secondary | ICD-10-CM | POA: Insufficient documentation

## 2020-01-26 ENCOUNTER — Ambulatory Visit: Payer: Medicare Other | Attending: Internal Medicine

## 2020-01-26 DIAGNOSIS — Z23 Encounter for immunization: Secondary | ICD-10-CM | POA: Insufficient documentation

## 2020-01-26 NOTE — Progress Notes (Signed)
   Covid-19 Vaccination Clinic  Name:  Lynnell Fiumara    MRN: [id number] DOB: 04/11/1941  01/26/2020  Ms. Takacs was observed post Covid-19 immunization for 15 minutes without incidence. She was provided with Vaccine Information Sheet and instruction to access the V-Safe system.   Ms. Cumberland was instructed to call 911 with any severe reactions post vaccine: Marland Kitchen Difficulty breathing  . Swelling of your face and throat  . A fast heartbeat  . A bad rash all over your body  . Dizziness and weakness    Immunizations Administered    Name Date Dose VIS Date Route   Pfizer COVID-19 Vaccine 01/26/2020  2:41 PM 0.3 mL 11/28/2019 Intramuscular   Manufacturer: [employer], [employer]   Lot: [id number]   NDC: [address]-2

## 2020-06-28 ENCOUNTER — Institutional Professional Consult (permissible substitution): Payer: Medicare Other | Admitting: Internal Medicine

## 2020-07-27 ENCOUNTER — Other Ambulatory Visit: Payer: Self-pay | Admitting: Internal Medicine

## 2020-07-27 DIAGNOSIS — M810 Age-related osteoporosis without current pathological fracture: Secondary | ICD-10-CM

## 2020-08-12 ENCOUNTER — Ambulatory Visit: Payer: Medicare Other | Admitting: Pulmonary Disease

## 2020-08-12 ENCOUNTER — Encounter: Payer: Self-pay | Admitting: Pulmonary Disease

## 2020-08-12 ENCOUNTER — Other Ambulatory Visit: Payer: Self-pay

## 2020-08-12 VITALS — BP 132/78 | HR 94 | Temp 97.3°F | Ht 65.0 in | Wt 127.4 lb

## 2020-08-12 DIAGNOSIS — J449 Chronic obstructive pulmonary disease, unspecified: Secondary | ICD-10-CM | POA: Insufficient documentation

## 2020-08-12 NOTE — Patient Instructions (Signed)
  Chest x-ray. Schedule PFTs.  Based on this we will consider enrolling you in a pulmonary rehab program Stay on Anoro for now

## 2020-08-12 NOTE — Assessment & Plan Note (Addendum)
Chest x-ray. Schedule PFTs.  Based on this we will consider enrolling her in a pulmonary rehab program.  Main issue seems to be deconditioning over the last 2 years. She also seems to have recurrent attacks of "bronchitis" Stay on Anoro for now, she had side effects with Breztri and would not want to try this again but we can consider Trelegy as alternative triple therapy if needed

## 2020-08-12 NOTE — Progress Notes (Signed)
Subjective:    Patient ID: Virginia [name], female    DOB: Jul 30, 1941, 79 y.o.   MRN: [id number]  HPI  79 year old ex-smoker presents for evaluation and to establish care for COPD. She reports shortness of breath ongoing for 5 years and recurrent attacks of "bronchitis" every 2 to 3 months for which she needs prednisone.  She has rarely needed an antibiotic so these do not seem to be infective exacerbations.  She admits to deconditioning over the last 2 years due to Covid pandemic, previously she was able to play golf twice a week.  She can walk a good distance on level ground but is unable to climb stairs, mailboxes down a hill and she is winded on walking back to her house, relieved by sitting down. She takes Mucinex DM on an as-needed basis. She has "bad " scoliosis but is otherwise healthy, no cardiac issues.  Anoro does cause some palpitations.  She rarely needs her albuterol inhaler for rescue. She tried [employer] inhaler a month ago and this caused side effects in the form of diarrhea and "fluid in my mouth".  She is hesitant to try any new medication  She smoked about a pack per day for 40 years before she quit in 2003, she is a retired Tree surgeon, drinks 2 ounces of red wine daily  Past Medical History:  Diagnosis Date   Allergic    Asthma    Osteopenia     History reviewed. No pertinent surgical history.  Allergies  Allergen Reactions   Aspartame And Phenylalanine Hives   Blue Dyes (Parenteral)    Chocolate Diarrhea   Gelatin Itching    Any gel caps   Latex    Red Dye    Adhesive [Tape] Rash   Sudafed [Pseudoephedrine Hcl] Anxiety    Makes her "drunk"    Social History   Socioeconomic History   Marital status: Divorced    Spouse name: Not on file   Number of children: Not on file   Years of education: Not on file   Highest education level: Not on file  Occupational History   Not on file  Tobacco Use   Smoking status: Former Smoker     Quit date: 07/12/2002    Years since quitting: 18.0   Smokeless tobacco: Never Used  Substance and Sexual Activity   Alcohol use: No   Drug use: No   Sexual activity: Not on file  Other Topics Concern   Not on file  Social History Narrative   Not on file   Social Determinants of Health   Financial Resource Strain:    Difficulty of Paying Living Expenses: Not on file  Food Insecurity:    Worried About Programme researcher, broadcasting/film/video in the Last Year: Not on file   [employer] of Food in the Last Year: Not on file  Transportation Needs:    Lack of Transportation (Medical): Not on file   Lack of Transportation (Non-Medical): Not on file  Physical Activity:    Days of Exercise per Week: Not on file   Minutes of Exercise per Session: Not on file  Stress:    Feeling of Stress : Not on file  Social Connections:    Frequency of Communication with Friends and Family: Not on file   Frequency of Social Gatherings with Friends and Family: Not on file   Attends Religious Services: Not on file   Active Member of Clubs or Organizations: Not on file   Attends Club  or Organization Meetings: Not on file   Marital Status: Not on file  Intimate Partner Violence:    Fear of Current or Ex-Partner: Not on file   Emotionally Abused: Not on file   Physically Abused: Not on file   Sexually Abused: Not on file     Family History  Problem Relation Age of Onset   Asthma Father       Review of Systems Shortness of breath with activity Productive cough Nasal congestion Sneezing   Constitutional: negative for anorexia, fevers and sweats  Eyes: negative for irritation, redness and visual disturbance  Ears, nose, mouth, throat, and face: negative for earaches, epistaxis, nasal congestion and sore throat  Cardiovascular: negative for chest pain, lower extremity edema, orthopnea, palpitations and syncope  Gastrointestinal: negative for abdominal pain, constipation, diarrhea, melena,  nausea and vomiting  Genitourinary:negative for dysuria, frequency and hematuria  Hematologic/lymphatic: negative for bleeding, easy bruising and lymphadenopathy  Musculoskeletal:negative for arthralgias, muscle weakness and stiff joints  Neurological: negative for coordination problems, gait problems, headaches and weakness  Endocrine: negative for diabetic symptoms including polydipsia, polyuria and weight loss     Objective:   Physical Exam  Gen. Pleasant, thin elderly woman, in no distress ENT - no thrush, no pallor/icterus,no post nasal drip Neck: No JVD, no thyromegaly, no carotid bruits Lungs: no use of accessory muscles, no dullness to percussion, decreased without rales or rhonchi  Cardiovascular: Rhythm regular, heart sounds  normal, no murmurs or gallops, no peripheral edema Musculoskeletal: No deformities, no cyanosis or clubbing         Assessment & Plan:

## 2020-08-13 ENCOUNTER — Ambulatory Visit: Payer: Medicare Other

## 2020-08-20 ENCOUNTER — Other Ambulatory Visit: Payer: Self-pay

## 2020-08-20 ENCOUNTER — Ambulatory Visit (INDEPENDENT_AMBULATORY_CARE_PROVIDER_SITE_OTHER): Payer: Medicare Other

## 2020-08-20 DIAGNOSIS — J449 Chronic obstructive pulmonary disease, unspecified: Secondary | ICD-10-CM | POA: Diagnosis not present

## 2020-08-25 ENCOUNTER — Telehealth: Payer: Self-pay | Admitting: Pulmonary Disease

## 2020-08-25 NOTE — Telephone Encounter (Signed)
Spoke with the pt  She is asking if she qualifies for covid booster now  Last vax Feb 2021  She takes pred tapers about every few wks her her bronchitis and wonders if this would qualify her  Please advise, thanks

## 2020-08-25 NOTE — Telephone Encounter (Signed)
LMTCB x 1 

## 2020-08-25 NOTE — Telephone Encounter (Signed)
Pt returning call.  [phone]

## 2020-08-27 NOTE — Telephone Encounter (Signed)
She is not deemed immunocompromised She would be eligible for a booster when it is available for her -hopefully soon

## 2020-08-27 NOTE — Telephone Encounter (Signed)
Attempted to call patient, line rang X3 then went silent.  Attempted to call back but went straight to fast busy signal.  Wcb.

## 2020-08-28 NOTE — Telephone Encounter (Signed)
Tried calling the pt  No answer on home or cell  Will close encounter per protocol

## 2020-08-31 ENCOUNTER — Telehealth: Payer: Self-pay | Admitting: Pulmonary Disease

## 2020-08-31 NOTE — Telephone Encounter (Signed)
atc pt, no answer and no option for vm. Copied from last OV note:   Oretha Milch, MD to [facility] Triage Pool     4:42 PM Note She is not deemed immunocompromised She would be eligible for a booster when it is available for her -hopefully soon    August 25, 2020     4:17 PM Christen Butter, CMA routed this conversation to Oretha Milch, MD  Christen Butter, CMA     4:17 PM Note Spoke with the pt  She is asking if she qualifies for covid booster now  Last vax Feb 2021  She takes pred tapers about every few wks her her bronchitis and wonders if this would qualify her  Please advise, thanks

## 2020-09-01 MED ORDER — PREDNISONE 10 MG PO TABS: ORAL_TABLET | ORAL | 0 refills | Status: DC

## 2020-09-01 NOTE — Telephone Encounter (Signed)
Prednisone 10 mg tabs  Take 2 tabs daily with food x 5ds, then 1 tab daily with food x 5ds then STOP  

## 2020-09-01 NOTE — Telephone Encounter (Signed)
Primary Pulmonologist: Vassie Loll Last office visit and with whom: 08/12/2020 What do we see them for (pulmonary problems): COPD Last OV assessment/plan:  Assessment & Plan:      Assessment & Plan Note by Oretha Milch, MD at 08/12/2020 10:44 AM Author: Oretha Milch, MD Author Type: Physician Filed: 08/12/2020 10:49 AM  Note Status: Carlisle Cater: Cosign Not Required Encounter Date: 08/12/2020  Problem: COPD with chronic bronchitis and emphysema (HCC)  Editor: Oretha Milch, MD (Physician)      Prior Versions: 1. Oretha Milch, MD (Physician) at 08/12/2020 10:45 AM - Written      Chest x-ray. Schedule PFTs.  Based on this we will consider enrolling her in a pulmonary rehab program.  Main issue seems to be deconditioning over the last 2 years. She also seems to have recurrent attacks of "bronchitis" Stay on Anoro for now, she had side effects with Markus Daft and would not want to try this again but we can consider Trelegy as alternative triple therapy if needed    Patient Instructions by Oretha Milch, MD at 08/12/2020 10:15 AM Author: Oretha Milch, MD Author Type: Physician Filed: 08/12/2020 10:43 AM  Note Status: Signed Cosign: Cosign Not Required Encounter Date: 08/12/2020  Editor: Oretha Milch, MD (Physician)                  Chest x-ray. Schedule PFTs.  Based on this we will consider enrolling you in a pulmonary rehab program Stay on Anoro for now    Instructions    Return in about 3 months (around 11/12/2020) for TP.  Chest x-ray. Schedule PFTs.  Based on this we will consider enrolling you in a pulmonary rehab program Stay on Anoro for now        After Visit Summary (Printed 08/12/2020)   Was appointment offered to patient (explain)?  No, sending to Dr. Vassie Loll   Reason for call:  Dry cough, wheezing, post nasal drip and sob for a week or more.  Sats are 97%.  She is taking Allegra, Chlortrimeton, fluticasone and musinex.  Denies any fever, chills, body  aches, sick contacts.  She is fully vaccinated against covid.  Dr. Vassie Loll, please advise  (examples of things to ask: : When did symptoms start? Fever? Cough? Productive? Color to sputum? More sputum than usual? Wheezing? Have you needed increased oxygen? Are you taking your respiratory medications? What over the counter measures have you tried?)  Allergies  Allergen Reactions   Aspartame And Phenylalanine Hives   Blue Dyes (Parenteral)    Chocolate Diarrhea   Gelatin Itching    Any gel caps   Latex    Red Dye    Adhesive [Tape] Rash   Sudafed [Pseudoephedrine Hcl] Anxiety    Makes her "drunk"    Immunization History  Administered Date(s) Administered   PFIZER SARS-COV-2 Vaccination 01/07/2020, 01/26/2020

## 2020-09-01 NOTE — Telephone Encounter (Signed)
Spoke with patient regarding prior message.Advised patient per Dr.Alva send in Prednisone 10 mg tabs  Take 2 tabs daily with food x 5ds, then 1 tab daily with food x 5ds then STOP. Patient's voice was understanding nothing else further need.

## 2020-09-01 NOTE — Telephone Encounter (Signed)
Patient is having wheezing. . Patient phone number is [phone].

## 2020-09-21 ENCOUNTER — Ambulatory Visit: Payer: Medicare Other | Admitting: Pulmonary Disease

## 2020-09-21 ENCOUNTER — Other Ambulatory Visit: Payer: Self-pay

## 2020-09-21 ENCOUNTER — Encounter: Payer: Self-pay | Admitting: Pulmonary Disease

## 2020-09-21 DIAGNOSIS — R053 Chronic cough: Secondary | ICD-10-CM | POA: Diagnosis not present

## 2020-09-21 DIAGNOSIS — J449 Chronic obstructive pulmonary disease, unspecified: Secondary | ICD-10-CM | POA: Diagnosis not present

## 2020-09-21 NOTE — Assessment & Plan Note (Signed)
Seems to be related to postnasal drip.  No overt reflux symptoms.  No evidence of ILD on chest x-ray or exam. She is on optimal regimen of Chlor-Trimeton, Flonase and Allegra. We will give her Atrovent nasal spray to see if there is a component of vasomotor rhinitis that would respond to this.  She had reaction to Sudafed in the past and so trying to avoid a decongestant If this does not work, we will trial empiric therapy for silent reflux

## 2020-09-21 NOTE — Assessment & Plan Note (Addendum)
Continue on Anoro. Use albuterol only as needed. Spirometry today shows severe airway obstruction, she will continue on Anoro, did not tolerate Breztri in the past.  We will consider adding inhaled steroids in the future depending on how she does.  Schedule full PFTs and of November to see if there is reversibility

## 2020-09-21 NOTE — Addendum Note (Signed)
Addended by: Pilar Grammes on: 09/21/2020 11:58 AM   Modules accepted: Orders

## 2020-09-21 NOTE — Patient Instructions (Signed)
Schedule PFts earlier Trial of atrovent nasal spray each nare to help with nasal drip

## 2020-09-21 NOTE — Progress Notes (Addendum)
   Subjective:    Patient ID: Virginia [name], female    DOB: 01-06-1941, 79 y.o.   MRN: [id number]  HPI  79 year old ex-smoker for follow-up of COPD and postnasal drip   Meds - Anoro caused palpitations.  Breztri inhaler -caused diarrhea and "fluid in my mouth".    She smoked about a pack per day for 40 years before she quit in 2003, she is a retired Tree surgeon,   Stage manager Complaint  Patient presents with  . Follow-up    pt has chronic cough but still not better,pt wants to go over xray,pt wants to know why testing is schedled at end of octobert    She called for Dry cough, wheezing, post nasal drip and sob for a week or more >> inspite of flonase, chlortrimeton, allegra Pred taper helped, low dose But she complains of persistent nasal drip and cough She has started with Silver sneakers, does about [phone] steps per day. Compliant with Anoro, does not need rescue inhaler much. Review chest x-ray from 9/3 which shows mild hyperinflation, mild cardiomegaly and impressive scoliosis   Vaccinations are up-to-date  Significant tests/ events reviewed  Spirometry 09/21/20 shows stability of obstruction with normal FEV1 ratio and FEV1 of 0.80/40%  Review of Systems neg for any significant sore throat, dysphagia, itching, sneezing, nasal congestion or excess/ purulent secretions, fever, chills, sweats, unintended wt loss, pleuritic or exertional cp, hempoptysis, orthopnea pnd or change in chronic leg swelling. Also denies presyncope, palpitations, heartburn, abdominal pain, nausea, vomiting, diarrhea or change in bowel or urinary habits, dysuria,hematuria, rash, arthralgias, visual complaints, headache, numbness weakness or ataxia.     Objective:   Physical Exam  Gen. Pleasant, thin, elderly, in no distress ENT - no thrush, no pallor/icterus,no post nasal drip Neck: No JVD, no thyromegaly, no carotid bruits Lungs: no use of accessory muscles, no dullness to percussion,  decreased BL without rales or rhonchi  Cardiovascular: Rhythm regular, heart sounds  normal, no murmurs or gallops, no peripheral edema Musculoskeletal: No deformities, no cyanosis or clubbing        Assessment & Plan:

## 2020-09-21 NOTE — Addendum Note (Signed)
Addended by: Luna Kitchens D on: 09/21/2020 11:44 AM   Modules accepted: Orders

## 2020-09-21 NOTE — Addendum Note (Signed)
Addended by: Cyril Mourning V on: 09/21/2020 12:17 PM   Modules accepted: Level of Service

## 2020-09-24 ENCOUNTER — Other Ambulatory Visit: Payer: Self-pay | Admitting: Pulmonary Disease

## 2020-09-24 NOTE — Telephone Encounter (Signed)
Atrovent nasal spray - 1 spray each nare daily Breztri 2 puffs twice daily x 1 month x 2 refills

## 2020-09-24 NOTE — Telephone Encounter (Signed)
Pt called the other day(Wednesday afternoon) and hasn't heard anything. RA told pt she could start using Atrovent, pt was told it was OTC. Prescription needed. Pt was told to try Virginia [name]- tried it for a week by her pcp, willing to try again. Needs prescription for Breztri. Please advise [phone] you call this afternoon)

## 2020-09-24 NOTE — Telephone Encounter (Signed)
Spoke with the pt  She states that the atrovent NS is not available otc yet  She is wanting [place] to send in rx Can you please advise on directions for her to take med  She also states that she thought about it and would be willing to try the [facility] again, despite not tolerating before  She wants rx for this as well  Please advise, thanks!

## 2020-09-25 MED ORDER — IPRATROPIUM BROMIDE 0.06 % NA SOLN: 1.0000 | Freq: Every day | NASAL | 12 refills | Status: DC

## 2020-09-25 MED ORDER — BREZTRI AEROSPHERE 160-9-4.8 MCG/ACT IN AERO: 2.0000 | INHALATION_SPRAY | Freq: Two times a day (BID) | RESPIRATORY_TRACT | 2 refills | Status: DC

## 2020-09-25 NOTE — Telephone Encounter (Signed)
Rx for Atrovent and Virginia [name] has been sent to preferred pharmacy.  Patient is aware and voiced his understanding.  Nothing further needed.

## 2020-10-06 ENCOUNTER — Telehealth: Payer: Self-pay | Admitting: Pulmonary Disease

## 2020-10-06 NOTE — Telephone Encounter (Signed)
Patient is aware of below message. Patient is aware that our office will contact her with a response from Dr. Vassie Loll tomorrow.

## 2020-10-06 NOTE — Telephone Encounter (Signed)
Dr. Vassie Loll patient last seen 09/21/2020 for COPD.  Called and spoke to patient.  Patient stated her PCP took her off of Breztri due to increased sob and rapid heart rate. HR was running between 145-150 for 11hr.  symptoms started two days after starting Breztri.  She last took Breztri yesterday morning.  She has an appt with PCP today at 12:00.   Beth, please advise. Thanks

## 2020-10-06 NOTE — Telephone Encounter (Signed)
Pt states that she went to see Dr. Ludwig Clarks and he wants pt to go back to the [facility] until one or the other of the providers gives her something else to do - please advise [phone]

## 2020-10-06 NOTE — Telephone Encounter (Signed)
Last note says she should be on ANORO. She did not tolerate Breztri in the past. Forwarding this to Dr. Vassie Loll, I believe he is back in the office tomorrow.

## 2020-10-07 NOTE — Telephone Encounter (Signed)
Called and spoke to pt. Informed her of the recs per Dr. Alva. Pt verbalized understanding and denied any further questions or concerns at this time.   

## 2020-10-07 NOTE — Telephone Encounter (Signed)
Okay to wait and see how she does on Anoro until her follow-up visit on 10/29

## 2020-10-15 ENCOUNTER — Encounter: Payer: Self-pay | Admitting: Pulmonary Disease

## 2020-10-15 ENCOUNTER — Ambulatory Visit (INDEPENDENT_AMBULATORY_CARE_PROVIDER_SITE_OTHER): Payer: Medicare Other | Admitting: Pulmonary Disease

## 2020-10-15 ENCOUNTER — Ambulatory Visit: Payer: Medicare Other | Admitting: Pulmonary Disease

## 2020-10-15 ENCOUNTER — Other Ambulatory Visit: Payer: Self-pay

## 2020-10-15 ENCOUNTER — Ambulatory Visit: Payer: Medicare Other | Admitting: Adult Health

## 2020-10-15 VITALS — BP 120/72 | HR 88 | Temp 97.4°F | Ht 64.5 in | Wt 126.2 lb

## 2020-10-15 DIAGNOSIS — J309 Allergic rhinitis, unspecified: Secondary | ICD-10-CM

## 2020-10-15 DIAGNOSIS — J449 Chronic obstructive pulmonary disease, unspecified: Secondary | ICD-10-CM | POA: Diagnosis not present

## 2020-10-15 DIAGNOSIS — Z87891 Personal history of nicotine dependence: Secondary | ICD-10-CM | POA: Diagnosis not present

## 2020-10-15 DIAGNOSIS — Z0000 Encounter for general adult medical examination without abnormal findings: Secondary | ICD-10-CM | POA: Insufficient documentation

## 2020-10-15 LAB — PULMONARY FUNCTION TEST
DL/VA % pred: 52 %
DL/VA: 2.1 ml/min/mmHg/L
DLCO cor % pred: 33 %
DLCO cor: 6.58 ml/min/mmHg
DLCO unc % pred: 33 %
DLCO unc: 6.58 ml/min/mmHg
FEF 25-75 Post: 0.34 L/sec
FEF 25-75 Pre: 0.29 L/sec
FEF2575-%Change-Post: 19 %
FEF2575-%Pred-Post: 24 %
FEF2575-%Pred-Pre: 20 %
FEV1-%Change-Post: 7 %
FEV1-%Pred-Post: 40 %
FEV1-%Pred-Pre: 37 %
FEV1-Post: 0.81 L
FEV1-Pre: 0.76 L
FEV1FVC-%Change-Post: 1 %
FEV1FVC-%Pred-Pre: 54 %
FEV6-%Change-Post: 6 %
FEV6-%Pred-Post: 72 %
FEV6-%Pred-Pre: 68 %
FEV6-Post: 1.85 L
FEV6-Pre: 1.75 L
FEV6FVC-%Change-Post: 0 %
FEV6FVC-%Pred-Post: 97 %
FEV6FVC-%Pred-Pre: 98 %
FVC-%Change-Post: 6 %
FVC-%Pred-Post: 74 %
FVC-%Pred-Pre: 69 %
FVC-Post: 2 L
FVC-Pre: 1.88 L
Post FEV1/FVC ratio: 41 %
Post FEV6/FVC ratio: 92 %
Pre FEV1/FVC ratio: 40 %
Pre FEV6/FVC Ratio: 93 %

## 2020-10-15 NOTE — Patient Instructions (Addendum)
You were seen today by Virginia Ceo, NP  for:   1. COPD with chronic bronchitis and emphysema (HCC)  - Alpha-1 antitrypsin phenotype; Future - CBC with Differential/Platelet; Future - IgE; Future  Walk today in office  Continue to improve daily physical exercise  Anoro Ellipta  >>> Take 1 puff daily in the morning right when you wake up >>>Rinse your mouth out after use >>>This is a daily maintenance inhaler, NOT a rescue inhaler >>>Contact our office if you are having difficulties affording or obtaining this medication >>>It is important for you to be able to take this daily and not miss any doses    Only use your albuterol as a rescue medication to be used if you can't catch your breath by resting or doing a relaxed purse lip breathing pattern.  - The less you use it, the better it will work when you need it. - Ok to use up to 2 puffs  every 4 hours if you must but call for immediate appointment if use goes up over your usual need - Don't leave home without it !!  (think of it like the spare tire for your car)   Note your daily symptoms > remember "red flags" for COPD:   >>>Increase in cough >>>increase in sputum production >>>increase in shortness of breath or activity  intolerance.   If you notice these symptoms, please call the office to be seen.    2. Former smoker  Continue to not smoke    3. Healthcare maintenance  Up-to-date with your COVID-19 vaccinations as well as flu vaccine  4. Allergic rhinitis, unspecified seasonality, unspecified trigger  Continue Allegra  Continue Atrovent nasal spray  Lab work today   We recommend today:  Orders Placed This Encounter  Procedures  . Alpha-1 antitrypsin phenotype    Standing Status:   Future    Standing Expiration Date:   10/15/2021  . CBC with Differential/Platelet    Standing Status:   Future    Standing Expiration Date:   10/15/2021  . IgE    Standing Status:   Future    Standing Expiration Date:    10/15/2021   Orders Placed This Encounter  Procedures  . Alpha-1 antitrypsin phenotype  . CBC with Differential/Platelet  . IgE   No orders of the defined types were placed in this encounter.   Follow Up:    Return in about 3 months (around 01/15/2021), or if symptoms worsen or fail to improve, for Follow up with Dr. Vassie Loll.   Notification of test results are managed in the following manner: If there are  any recommendations or changes to the  plan of care discussed in office today,  we will contact you and let you know what they are. If you do not hear from [place], then your results are normal and you can view them through your  MyChart account , or a letter will be sent to you. Thank you again for trusting [place] with your care  - Thank you, [facility]    It is flu season:   >>> Best ways to protect herself from the flu: Receive the yearly flu vaccine, practice good hand hygiene washing with soap and also using hand sanitizer when available, eat a nutritious meals, get adequate rest, hydrate appropriately       Please contact the office if your symptoms worsen or you have concerns that you are not improving.   Thank you for choosing [facility] for  your healthcare, and for allowing [place] to partner with you on your healthcare journey. I am thankful to be able to provide care to you today.   [name]    Chronic Obstructive Pulmonary Disease Chronic obstructive pulmonary disease (COPD) is a long-term (chronic) lung problem. When you have COPD, it is hard for air to get in and out of your lungs. Usually the condition gets worse over time, and your lungs will never return to normal. There are things you can do to keep yourself as healthy as possible.  Your doctor may treat your condition with: ? Medicines. ? Oxygen. ? Lung surgery.  Your doctor may also recommend: ? Rehabilitation. This includes steps to make your body work better. It may involve a team of  specialists. ? Quitting smoking, if you smoke. ? Exercise and changes to your diet. ? Comfort measures (palliative care). Follow these instructions at home: Medicines  Take over-the-counter and prescription medicines only as told by your doctor.  Talk to your doctor before taking any cough or allergy medicines. You may need to avoid medicines that cause your lungs to be dry. Lifestyle  If you smoke, stop. Smoking makes the problem worse. If you need help quitting, ask your doctor.  Avoid being around things that make your breathing worse. This may include smoke, chemicals, and fumes.  Stay active, but remember to rest as well.  Learn and use tips on how to relax.  Make sure you get enough sleep. Most adults need at least 7 hours of sleep every night.  Eat healthy foods. Eat smaller meals more often. Rest before meals. Controlled breathing Learn and use tips on how to control your breathing as told by your doctor. Try:  Breathing in (inhaling) through your nose for 1 second. Then, pucker your lips and breath out (exhale) through your lips for 2 seconds.  Putting one hand on your belly (abdomen). Breathe in slowly through your nose for 1 second. Your hand on your belly should move out. Pucker your lips and breathe out slowly through your lips. Your hand on your belly should move in as you breathe out.  Controlled coughing Learn and use controlled coughing to clear mucus from your lungs. Follow these steps: 1. Lean your head a little forward. 2. Breathe in deeply. 3. Try to hold your breath for 3 seconds. 4. Keep your mouth slightly open while coughing 2 times. 5. Spit any mucus out into a tissue. 6. Rest and do the steps again 1 or 2 times as needed. General instructions  Make sure you get all the shots (vaccines) that your doctor recommends. Ask your doctor about a flu shot and a pneumonia shot.  Use oxygen therapy and pulmonary rehabilitation if told by your doctor. If you  need home oxygen therapy, ask your doctor if you should buy a tool to measure your oxygen level (oximeter).  Make a COPD action plan with your doctor. This helps you to know what to do if you feel worse than usual.  Manage any other conditions you have as told by your doctor.  Avoid going outside when it is very hot, cold, or humid.  Avoid people who have a sickness you can catch (contagious).  Keep all follow-up visits as told by your doctor. This is important. Contact a doctor if:  You cough up more mucus than usual.  There is a change in the color or thickness of the mucus.  It is harder to breathe than usual.  Your breathing is faster than usual.  You have trouble sleeping.  You need to use your medicines more often than usual.  You have trouble doing your normal activities such as getting dressed or walking around the house. Get help right away if:  You have shortness of breath while resting.  You have shortness of breath that stops you from: ? Being able to talk. ? Doing normal activities.  Your chest hurts for longer than 5 minutes.  Your skin color is more blue than usual.  Your pulse oximeter shows that you have low oxygen for longer than 5 minutes.  You have a fever.  You feel too tired to breathe normally. Summary  Chronic obstructive pulmonary disease (COPD) is a long-term lung problem.  The way your lungs work will never return to normal. Usually the condition gets worse over time. There are things you can do to keep yourself as healthy as possible.  Take over-the-counter and prescription medicines only as told by your doctor.  If you smoke, stop. Smoking makes the problem worse. This information is not intended to replace advice given to you by your health care provider. Make sure you discuss any questions you have with your health care provider. Document Revised: 11/16/2017 Document Reviewed: 01/08/2017 Elsevier Patient Education  2020 [employer].

## 2020-10-15 NOTE — Progress Notes (Signed)
@Patient  ID: , female    DOB: 24-Aug-1941, 79 y.o.   MRN: [id number]  Chief Complaint  Patient presents with  . Follow-up    COPD, still SOB and cough    Referring provider: [id number], MD  HPI:  79 year old female former smoker followed in our office for COPD  PMH: Multiple environmental allergies Smoker/ Smoking History: Former Smoker. Quit 2003. 84 pack year history.  Maintenance:  Anoro Ellipta Pt of: Dr. 2004   10/15/2020  - Visit   79 year old female former smoker found our office for COPD.  Last seen by Dr. 94 in October/2021.  At last office visit on 09/21/2020 it was recommended that she obtain pulmonary function testing she was trialed on Breztri which she has not tolerated in the past.  Causing heart palpitations.  She completed pulmonary function testing today those results are listed below:  10/15/2020-pulmonary function test-FVC 1.88 (69% predicted), postbronchodilator ratio 41, postbronchodilator FEV1 0.81 (40% predicted), no bronchodilator response, DLCO 6.58 (33% predicted)  Patient reports that she is recently started exercise classes.  2 times a week.  She is trying to walk with her sister on the off days.  To increase her overall physical activity.  She is up-to-date with her COVID-19 vaccinations as well as seasonal flu vaccine.  Walk today in office is stable with no oxygen desaturations  Patient reports that she has significant allergy history.  Lots of family members have asthma or allergies.  She has persistent rhinitis.  She reports that she was assessed by an allergist over 30 years ago and she had over 30+ allergies.  She does not have these records.  Questionaires / Pulmonary Flowsheets:   ACT:  No flowsheet data found.  MMRC: mMRC Dyspnea Scale mMRC Score  10/15/2020 1  08/12/2020 1    Epworth:  No flowsheet data found.  Tests:   10/15/2020-pulmonary function test-FVC 1.88 (69% predicted), postbronchodilator ratio 41,  postbronchodilator FEV1 0.81 (40% predicted), no bronchodilator response, DLCO 6.58 (33% predicted)  FENO:  No results found for: NITRICOXIDE  PFT: PFT Results Latest Ref Rng & Units 10/15/2020  FVC-Pre L 1.88  FVC-Predicted Pre % 69  FVC-Post L 2.00  FVC-Predicted Post % 74  Pre FEV1/FVC % % 40  Post FEV1/FCV % % 41  FEV1-Pre L 0.76  FEV1-Predicted Pre % 37  FEV1-Post L 0.81  DLCO uncorrected ml/min/mmHg 6.58  DLCO UNC% % 33  DLCO corrected ml/min/mmHg 6.58  DLCO COR %Predicted % 33  DLVA Predicted % 52    WALK:  No flowsheet data found.  Imaging: No results found.  Lab Results:  CBC No results found for: WBC, RBC, HGB, HCT, PLT, MCV, MCH, MCHC, RDW, LYMPHSABS, MONOABS, EOSABS, BASOSABS  BMET No results found for: NA, K, CL, CO2, GLUCOSE, BUN, CREATININE, CALCIUM, GFRNONAA, GFRAA  BNP No results found for: BNP  ProBNP No results found for: PROBNP  Specialty Problems      Pulmonary Problems   COPD with chronic bronchitis and emphysema (HCC)   Chronic cough   Allergic rhinitis      Allergies  Allergen Reactions  . Aspartame And Phenylalanine Hives  . Blue Dyes (Parenteral)   . Chocolate Diarrhea  . Gelatin Itching    Any gel caps  . Latex   . Red Dye   . Adhesive [Tape] Rash  . Sudafed [Pseudoephedrine Hcl] Anxiety    Makes her "drunk"    Immunization History  Administered Date(s) Administered  . Influenza-Unspecified 09/03/2020  .  PFIZER SARS-COV-2 Vaccination 01/07/2020, 01/26/2020   covid booster - oct/2021  Past Medical History:  Diagnosis Date  . Allergic   . Asthma   . Osteopenia     Tobacco History: Social History   Tobacco Use  Smoking Status Former Smoker  . Packs/day: 2.00  . Years: 42.00  . Pack years: 84.00  . Start date: 12/18/1954  . Quit date: 07/12/2002  . Years since quitting: 18.2  Smokeless Tobacco Never Used  Tobacco Comment   Quit smoking for about 5 years - 1974-1979   Counseling given: Not  Answered Comment: Quit smoking for about 5 years - 1974-1979   Continue to not smoke  Outpatient Encounter Medications as of 10/15/2020  Medication Sig  . albuterol (PROVENTIL) (2.5 MG/3ML) 0.083% nebulizer solution Take 2.5 mg by nebulization every 6 (six) hours as needed.  . calcium carbonate (TUMS - DOSED IN MG ELEMENTAL CALCIUM) 500 MG chewable tablet Chew 1 tablet by mouth daily.  Marland Kitchen denosumab (PROLIA) 60 MG/ML SOLN injection Inject 60 mg into the skin every 6 (six) months. Administer in upper arm, thigh, or abdomen  . dextromethorphan-guaiFENesin (MUCINEX DM) 30-600 MG 12hr tablet Take 1 tablet by mouth 2 (two) times daily.  . fexofenadine (ALLEGRA) 30 MG tablet Take 30 mg by mouth 2 (two) times daily.  Marland Kitchen ipratropium (ATROVENT) 0.06 % nasal spray Place 1 spray into both nostrils daily.  . magnesium oxide (MAG-OX) 400 MG tablet Take 400 mg by mouth daily.  Marland Kitchen Umeclidinium-Vilanterol (ANORO ELLIPTA IN) Inhale into the lungs once.  . [DISCONTINUED] Budeson-Glycopyrrol-Formoterol (BREZTRI AEROSPHERE) 160-9-4.8 MCG/ACT AERO Inhale 2 puffs into the lungs in the morning and at bedtime.   No facility-administered encounter medications on file as of 10/15/2020.     Review of Systems  Review of Systems  Constitutional: Positive for fatigue. Negative for activity change and fever.  HENT: Positive for congestion and postnasal drip. Negative for sinus pressure, sinus pain and sore throat.   Respiratory: Positive for shortness of breath. Negative for cough and wheezing.   Cardiovascular: Negative for chest pain and palpitations.  Gastrointestinal: Negative for diarrhea, nausea and vomiting.  Musculoskeletal: Negative for arthralgias.  Neurological: Negative for dizziness.  Psychiatric/Behavioral: Negative for sleep disturbance. The patient is not nervous/anxious.      Physical Exam  BP 120/72 (BP Location: Left Arm, Cuff Size: Normal)   Pulse 88   Temp (!) 97.4 F (36.3 C) (Oral)   Ht  5' 4.5" (1.638 m)   Wt 126 lb 3.2 oz (57.2 kg)   SpO2 92%   BMI 21.33 kg/m   Wt Readings from Last 5 Encounters:  10/15/20 126 lb 3.2 oz (57.2 kg)  09/21/20 126 lb 3.2 oz (57.2 kg)  08/12/20 127 lb 6.4 oz (57.8 kg)  08/12/16 113 lb (51.3 kg)  10/08/15 108 lb 6.4 oz (49.2 kg)    BMI Readings from Last 5 Encounters:  10/15/20 21.33 kg/m  09/21/20 21.00 kg/m  08/12/20 21.20 kg/m  08/12/16 18.80 kg/m  10/08/15 18.04 kg/m     Physical Exam Vitals and nursing note reviewed.  Constitutional:      General: She is not in acute distress.    Appearance: Normal appearance. She is normal weight.  HENT:     Head: Normocephalic and atraumatic.     Right Ear: Tympanic membrane, ear canal and external ear normal. There is no impacted cerumen.     Left Ear: Tympanic membrane, ear canal and external ear normal. There is no impacted  cerumen.     Nose: Congestion and rhinorrhea present.     Mouth/Throat:     Mouth: Mucous membranes are moist.     Pharynx: Oropharynx is clear.     Comments: Postnasal drip Eyes:     Pupils: Pupils are equal, round, and reactive to light.  Cardiovascular:     Rate and Rhythm: Normal rate and regular rhythm.     Pulses: Normal pulses.     Heart sounds: Normal heart sounds. No murmur heard.   Pulmonary:     Effort: Pulmonary effort is normal. No respiratory distress.     Breath sounds: Normal breath sounds. No decreased air movement. No decreased breath sounds, wheezing or rales.  Musculoskeletal:     Cervical back: Normal range of motion.  Skin:    General: Skin is warm and dry.     Capillary Refill: Capillary refill takes less than 2 seconds.  Neurological:     General: No focal deficit present.     Mental Status: She is alert and oriented to person, place, and time. Mental status is at baseline.     Gait: Gait (Tolerated walk in office without any oxygen desaturations and talked for all 3 laps) normal.  Psychiatric:        Mood and Affect: Mood  normal.        Behavior: Behavior normal.        Thought Content: Thought content normal.        Judgment: Judgment normal.       Assessment & Plan:   COPD with chronic bronchitis and emphysema (HCC) Reviewed pulmonary function testing today with patient Will add breztri to allergy list Chest sounds clear to auscultation today Walk today in office stable without any oxygen desaturations  Plan: Lab work today Continue Anoro Ellipta Continue exercise program Patient is aware that if she becomes interested we can refer her to pulmonary rehab Up-to-date with her vaccinations Follow-up in 3 to 4 months with Dr. 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 Vassie Loll.   Coral Ceo, NP 10/15/2020   This appointment required 32 minutes of patient care (this includes precharting, chart review, review of results, face-to-face care, etc.).

## 2020-10-15 NOTE — Assessment & Plan Note (Signed)
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

## 2020-10-15 NOTE — Progress Notes (Signed)
PFT done today. 

## 2020-10-15 NOTE — Assessment & Plan Note (Signed)
Plan: Up-to-date with her vaccinations

## 2020-10-15 NOTE — Assessment & Plan Note (Signed)
Plan: Continue to not smoke 

## 2020-10-15 NOTE — Assessment & Plan Note (Signed)
Reviewed pulmonary function testing today with patient Will add breztri to allergy list Chest sounds clear to auscultation today Walk today in office stable without any oxygen desaturations  Plan: Lab work today Continue Anoro Ellipta Continue exercise program Patient is aware that if she becomes interested we can refer her to pulmonary rehab Up-to-date with her vaccinations Follow-up in 3 to 4 months with Dr. Vassie Loll

## 2020-10-16 LAB — CBC WITH DIFFERENTIAL/PLATELET
Absolute Monocytes: 670 cells/uL (ref 200–950)
Basophils Absolute: 69 cells/uL (ref 0–200)
Basophils Relative: 0.9 %
Eosinophils Absolute: 177 cells/uL (ref 15–500)
Eosinophils Relative: 2.3 %
HCT: 44.8 % (ref 35.0–45.0)
Hemoglobin: 14.7 g/dL (ref 11.7–15.5)
Lymphs Abs: 2402 cells/uL (ref 850–3900)
MCH: 29.5 pg (ref 27.0–33.0)
MCHC: 32.8 g/dL (ref 32.0–36.0)
MCV: 90 fL (ref 80.0–100.0)
MPV: 12.9 fL — ABNORMAL HIGH (ref 7.5–12.5)
Monocytes Relative: 8.7 %
Neutro Abs: 4381 cells/uL (ref 1500–7800)
Neutrophils Relative %: 56.9 %
Platelets: 186 10*3/uL (ref 140–400)
RBC: 4.98 10*6/uL (ref 3.80–5.10)
RDW: 13.2 % (ref 11.0–15.0)
Total Lymphocyte: 31.2 %
WBC: 7.7 10*3/uL (ref 3.8–10.8)

## 2020-10-26 ENCOUNTER — Telehealth: Payer: Self-pay | Admitting: Pulmonary Disease

## 2020-10-26 NOTE — Telephone Encounter (Signed)
10/26/2020  Please let the patient know that alpha-1 antitrypsin lab test is normal.  We are still waiting for the phenotype.  No new recommendations.  Elisha Headland, FNP

## 2020-10-26 NOTE — Telephone Encounter (Signed)
I called and spoke with patient regarding labwork. She verbalized understanding and nothing further was needed.

## 2020-10-28 LAB — ALPHA-1 ANTITRYPSIN PHENOTYPE: A-1 Antitrypsin, Ser: 184 mg/dL (ref 83–199)

## 2020-10-28 LAB — IGE: IgE (Immunoglobulin E), Serum: 55 kU/L (ref <=114)

## 2020-11-02 ENCOUNTER — Ambulatory Visit
Admission: RE | Admit: 2020-11-02 | Discharge: 2020-11-02 | Disposition: A | Discharge status: Home / Self Care | Payer: Medicare Other | Source: Ambulatory Visit | Attending: Internal Medicine | Admitting: Internal Medicine

## 2020-11-02 ENCOUNTER — Other Ambulatory Visit: Payer: Self-pay

## 2020-11-02 DIAGNOSIS — M810 Age-related osteoporosis without current pathological fracture: Secondary | ICD-10-CM

## 2020-11-15 ENCOUNTER — Ambulatory Visit: Payer: Medicare Other | Admitting: Adult Health

## 2020-11-16 ENCOUNTER — Telehealth: Payer: Self-pay | Admitting: Pulmonary Disease

## 2020-11-16 NOTE — Telephone Encounter (Signed)
Called and spoke with patient who states that she is in the gap with her insurance due to her Prolia injections being expensive. Requesting patient assistance paperwork for Anoro. Paperwork has been printed and placed up front for patient to pick up. States that normally by August she is in the gap and seems to meet requirements for patient assistance.   Patient is also asking about Alpha 1 lab work from October states that she was told about the other 2 but was informed that they were waiting for that one to come back and has not heard anything.  Brain please advise

## 2020-11-16 NOTE — Telephone Encounter (Signed)
11/16/2020  Alpha-1 antitrypsin level was normal.  Alpha-1 phenotype is also normal.  No new recommendations.  Elisha Headland, FNP

## 2020-11-16 NOTE — Telephone Encounter (Signed)
Called and spoke with patient about lab work results from [place]. She expressed understanding. Nothing further needed at this time.

## 2020-12-01 ENCOUNTER — Telehealth: Payer: Self-pay | Admitting: Pulmonary Disease

## 2020-12-01 MED ORDER — AZITHROMYCIN 250 MG PO TABS: ORAL_TABLET | ORAL | 0 refills | Status: DC

## 2020-12-01 NOTE — Telephone Encounter (Signed)
Continue Mucinex. Plenty of oral hydration Send in prescription for Z-Pak which she can start if she develops yellow or green sputum. Cover testing if she develops fevers

## 2020-12-01 NOTE — Telephone Encounter (Signed)
Primary Pulmonologist: Dr. Vassie Loll Last office visit and with whom: 10/15/20 with BPM  What do we see them for (pulmonary problems): COPD Last OV assessment/plan:   COPD with chronic bronchitis and emphysema (HCC) Reviewed pulmonary function testing today with patient Will add breztri to allergy list Chest sounds clear to auscultation today Walk today in office stable without any oxygen desaturations  Plan: Lab work today Continue Anoro Ellipta Continue exercise program Patient is aware that if she becomes interested we can refer her to pulmonary rehab Up-to-date with her vaccinations Follow-up in 3 to 4 months with Dr. 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  [place]   Reason for call: Late last night patient stated having a cough and more congestion.  She hasn't been able to really cough anything up if she does it is white, is taking Mucinex DM but not noticing much difference. She states she hasn't felt like she has a fever, she is taking 2 puffs of her inhaler when she usually only does 1 puff. Has not been recently tested for covid states she always wears a mask and she has her booster, she can't remember the date just knows when she was told "old people" can get it that's when she got her booster.  RA please advise  Allergies  Allergen Reactions  . Aspartame And Phenylalanine Hives  . Blue Dyes (Parenteral)   . Chocolate Diarrhea  . Gelatin Itching    Any gel caps  . Latex   . Red Dye   . Adhesive [Tape] Rash  . Sudafed [Pseudoephedrine Hcl] Anxiety    Makes her "drunk"    Immunization History  Administered Date(s) Administered  .  Influenza-Unspecified 09/03/2020  . PFIZER SARS-COV-2 Vaccination 01/07/2020, 01/26/2020

## 2020-12-01 NOTE — Telephone Encounter (Signed)
Spoke with pt, aware of recs.  abx sent to preferred pharmacy.  Nothing further needed at this time- will close encounter.

## 2020-12-07 ENCOUNTER — Telehealth: Payer: Self-pay | Admitting: Primary Care

## 2020-12-07 ENCOUNTER — Telehealth: Payer: Self-pay | Admitting: Pulmonary Disease

## 2020-12-07 NOTE — Telephone Encounter (Signed)
I spoke with the pt and have scheduled her with BW tomorrow at 10:00 am

## 2020-12-07 NOTE — Telephone Encounter (Signed)
Patient would like to be called at [phone].

## 2020-12-07 NOTE — Telephone Encounter (Signed)
She has severe COPD.  Please get her in for an office visit with APP If OV not available in the next 2 days then please prescribe Prednisone 10 mg tabs  Take 2 tabs daily with food x 5ds, then 1 tab daily with food x 5ds then STOP Doxycycline 100 daily for 7 days

## 2020-12-07 NOTE — Telephone Encounter (Addendum)
Called and spoke to pt. Pt states she still hasnt improved much from the bronchitis. Pt states overall she doesn't feel bad, denies general malaise. Pt c/o head pressure, sinus mucus - light yellow, barking cough with little mucus production - light yellow, wheezing. Pt denies CP/tightness, f/c/s, swelling. Pt states she took the zpak and completed it on 12/05/20. Pt states the zpak seemed to thin her chest congestion a bit. Pt is taking Mucinex DM with little relief. Pt states she feels this illness is never ending and is requesting recs for relief. Pt last seen on 10.29.21 by Elisha Headland, NP.   Dr. Vassie Loll, please advise. Thanks.

## 2020-12-07 NOTE — Telephone Encounter (Signed)
Pt has been scheduled for appt for tomorrow with Beth at 10:00 am  Beth rec that we inquire on recent covid exposure and offer televisit rather than in office  Pt did have neg covid test today- Beth states could still be pos depending on exposure  LMTCB for the pt and will forward to triage to f/u on

## 2020-12-07 NOTE — Telephone Encounter (Signed)
ATC pt x 2. Line rang then would disconnect. WCB.

## 2020-12-08 ENCOUNTER — Other Ambulatory Visit: Payer: Self-pay

## 2020-12-08 ENCOUNTER — Ambulatory Visit: Payer: Medicare Other | Admitting: Primary Care

## 2020-12-08 ENCOUNTER — Ambulatory Visit (INDEPENDENT_AMBULATORY_CARE_PROVIDER_SITE_OTHER): Payer: Medicare Other

## 2020-12-08 ENCOUNTER — Encounter: Payer: Self-pay | Admitting: Primary Care

## 2020-12-08 ENCOUNTER — Encounter: Payer: Self-pay | Admitting: *Deleted

## 2020-12-08 VITALS — BP 148/84 | HR 105 | Temp 97.9°F | Ht 65.5 in | Wt 124.2 lb

## 2020-12-08 DIAGNOSIS — R053 Chronic cough: Secondary | ICD-10-CM | POA: Diagnosis not present

## 2020-12-08 DIAGNOSIS — J449 Chronic obstructive pulmonary disease, unspecified: Secondary | ICD-10-CM

## 2020-12-08 MED ORDER — AMOXICILLIN-POT CLAVULANATE 875-125 MG PO TABS: 1.0000 | ORAL_TABLET | Freq: Two times a day (BID) | ORAL | 0 refills | Status: DC

## 2020-12-08 MED ORDER — PREDNISONE 10 MG PO TABS: ORAL_TABLET | ORAL | 0 refills | Status: DC

## 2020-12-08 NOTE — Progress Notes (Signed)
CXR showed findings compatible with COPD, no acute abnormality

## 2020-12-08 NOTE — Telephone Encounter (Signed)
Spoke with the pt  She said that she has not been exposed to anyone with covid and does not know why her chart would reflect that  Her covid test was neg 12/07/20  Wants to keep appt for 10 am today with Waynetta Sandy  Will forward to [facility] to make her aware

## 2020-12-08 NOTE — Progress Notes (Signed)
@Patient  ID: , female    DOB: 1941-03-24, 79 y.o.   MRN: [id number]  Chief Complaint  Patient presents with  . Acute Visit    Sob with coughing, cough is yellow, white, wheezing, no fcs    Referring provider: [id number], MD  HPI: 79 year old female, former smoker quit in 2003 (84 pack year hx). PMH significant for severe COPD, allergic rhinitis, chronic cough. Patient of Dr. 03-26-2006, last seen by pulmonary NP on 10/15/20. Maintained on Anoro Ellipta.   10/15/2020-pulmonary function test-FVC 1.88 (69% predicted), postbronchodilator ratio 41, postbronchodilator FEV1 0.81 (40% predicted), no bronchodilator response, DLCO 6.58 (33% predicted).    12/08/2020- Interim hx  Patient presents today for acute visit/bronchitis. She develop congested cough last week while in [facility] with her two cousins. Cough is productive with thick clear mucus. Associated wheezing. She called our office on 12/01/20 and was sent in zpack which she finished on Sunday. She went [facility] 12/07/20 for covid test which was negative. Denies known sick contacts.    PFT: PFT Results Latest Ref Rng & Units 10/15/2020  FVC-Pre L 1.88  FVC-Predicted Pre % 69  FVC-Post L 2.00  FVC-Predicted Post % 74  Pre FEV1/FVC % % 40  Post FEV1/FCV % % 41  FEV1-Pre L 0.76  FEV1-Predicted Pre % 37  FEV1-Post L 0.81  DLCO uncorrected ml/min/mmHg 6.58  DLCO UNC% % 33  DLCO corrected ml/min/mmHg 6.58  DLCO COR %Predicted % 33  DLVA Predicted % 52     Allergies  Allergen Reactions  . Aspartame And Phenylalanine Hives  . Blue Dyes (Parenteral)   . Chocolate Diarrhea  . Gelatin Itching    Any gel caps  . Latex   . Red Dye   . Adhesive [Tape] Rash  . Sudafed [Pseudoephedrine Hcl] Anxiety    Makes her "drunk"    Immunization History  Administered Date(s) Administered  . Influenza-Unspecified 09/03/2020  . PFIZER SARS-COV-2 Vaccination 01/07/2020, 01/26/2020, 09/21/2020    Past Medical  History:  Diagnosis Date  . Allergic   . Asthma   . Osteopenia     Tobacco History: Social History   Tobacco Use  Smoking Status Former Smoker  . Packs/day: 2.00  . Years: 42.00  . Pack years: 84.00  . Start date: 12/18/1954  . Quit date: 07/12/2002  . Years since quitting: 18.4  Smokeless Tobacco Never Used  Tobacco Comment   Quit smoking for about 5 years - 1974-1979   Counseling given: Not Answered Comment: Quit smoking for about 5 years - 1974-1979   Outpatient Medications Prior to Visit  Medication Sig Dispense Refill  . albuterol (VENTOLIN HFA) 108 (90 Base) MCG/ACT inhaler Inhale 1 puff into the lungs every 6 (six) hours as needed for wheezing or shortness of breath.    . calcium carbonate (TUMS - DOSED IN MG ELEMENTAL CALCIUM) 500 MG chewable tablet Chew 1 tablet by mouth daily.    11-22-2002 denosumab (PROLIA) 60 MG/ML SOLN injection Inject 60 mg into the skin every 6 (six) months. Administer in upper arm, thigh, or abdomen    . dextromethorphan-guaiFENesin (MUCINEX DM) 30-600 MG 12hr tablet Take 1 tablet by mouth 2 (two) times daily.    . fexofenadine (ALLEGRA) 30 MG tablet Take 30 mg by mouth 2 (two) times daily.    Marland Kitchen ipratropium (ATROVENT) 0.06 % nasal spray Place 1 spray into both nostrils daily. 15 mL 12  . magnesium oxide (MAG-OX) 400 MG tablet Take 400 mg  by mouth daily.    Marland Kitchen Umeclidinium-Vilanterol (ANORO ELLIPTA IN) Inhale into the lungs once.    Marland Kitchen albuterol (PROVENTIL) (2.5 MG/3ML) 0.083% nebulizer solution Take 2.5 mg by nebulization every 6 (six) hours as needed. (Patient not taking: Reported on 12/08/2020)    . azithromycin (ZITHROMAX Z-PAK) 250 MG tablet Take 2 tabs today, then 1 daily until gone (Patient not taking: Reported on 12/08/2020) 6 each 0   No facility-administered medications prior to visit.    Review of Systems  Review of Systems  Constitutional: Negative.   Respiratory: Positive for cough and wheezing.    Physical Exam  BP (!) 148/84 (BP  Location: Left Arm, Cuff Size: Normal)   Pulse (!) 105   Temp 97.9 F (36.6 C) (Temporal)   Ht 5' 5.5" (1.664 m)   Wt 124 lb 3.2 oz (56.3 kg)   SpO2 94%   BMI 20.35 kg/m  Physical Exam Constitutional:      Appearance: Normal appearance.  HENT:     Head: Normocephalic and atraumatic.     Mouth/Throat:     Mouth: Mucous membranes are moist.     Pharynx: Oropharynx is clear.  Cardiovascular:     Rate and Rhythm: Normal rate and regular rhythm.     Comments: No edema Pulmonary:     Breath sounds: Wheezing present. No rales.  Musculoskeletal:        General: Normal range of motion.     Cervical back: Normal range of motion and neck supple.  Skin:    General: Skin is warm and dry.  Neurological:     General: No focal deficit present.     Mental Status: She is alert and oriented to person, place, and time. Mental status is at baseline.  Psychiatric:        Mood and Affect: Mood normal.        Behavior: Behavior normal.        Thought Content: Thought content normal.        Judgment: Judgment normal.      Lab Results:  CBC    Component Value Date/Time   WBC 7.7 10/15/2020 1652   RBC 4.98 10/15/2020 1652   HGB 14.7 10/15/2020 1652   HCT 44.8 10/15/2020 1652   PLT 186 10/15/2020 1652   MCV 90.0 10/15/2020 1652   MCH 29.5 10/15/2020 1652   MCHC 32.8 10/15/2020 1652   RDW 13.2 10/15/2020 1652   LYMPHSABS 2,402 10/15/2020 1652   EOSABS 177 10/15/2020 1652   BASOSABS 69 10/15/2020 1652    BMET No results found for: NA, K, CL, CO2, GLUCOSE, BUN, CREATININE, CALCIUM, GFRNONAA, GFRAA  BNP No results found for: BNP  ProBNP No results found for: PROBNP  Imaging: DG Chest 2 View  Result Date: 12/08/2020 CLINICAL DATA:  Chronic cough. EXAM: CHEST - 2 VIEW COMPARISON:  PA and lateral chest 08/20/2020. FINDINGS: The chest is hyperexpanded and there is peribronchial thickening, unchanged. No consolidative process, pneumothorax or effusion. Heart size is normal. Marked  convex right thoracolumbar scoliosis again seen. Calcified breast implants also again noted. IMPRESSION: Findings compatible with COPD. No acute abnormality or change compared to the prior study. Electronically Signed   By: Drusilla Kanner M.D.   On: 12/08/2020 11:02     Assessment & Plan:   COPD with chronic bronchitis and emphysema (HCC) - Acute exacerbation of COPD/chronic bronchitis. Developed productive cough x1 week ago with associated wheezing. On exam patient had coarse wheezes throughout. O2 was 94% RA.  CXR today showed no acute process - RX Augmentin 1 tab twice daily x 7 days (patient has a red dye allergy which limits abx choice) and prednisone taper 40mg  x 2 days, 30mg  x 2 days, 20mg   2 days, 10mg  x 2 days  - Continue Anoro Ellipta 1 puff daily; Albuterol hfa 2 puff every 4-6 hours for sob/wheezing  - FU if symptoms do not improve or worns   , NP 12/08/2020

## 2020-12-08 NOTE — Assessment & Plan Note (Addendum)
-   Acute exacerbation of COPD/chronic bronchitis. Developed productive cough x1 week ago with associated wheezing. On exam patient had coarse wheezes throughout. O2 was 94% RA. CXR today showed no acute process - RX Augmentin 1 tab twice daily x 7 days (patient has a red dye allergy which limits abx choice) and prednisone taper 40mg  x 2 days, 30mg  x 2 days, 20mg   2 days, 10mg  x 2 days  - Continue Anoro Ellipta 1 puff daily; Albuterol hfa 2 puff every 4-6 hours for sob/wheezing  - FU if symptoms do not improve or worns

## 2020-12-08 NOTE — Patient Instructions (Addendum)
Recommendations - Continue Anoro Ellipta 1 puff daily - Take Mucinex 600-1200mg  twice daily with a glass of water for chest congestion  Rx - Prednisone taper (40 mg x 2 days, 30 mg x 2 days, 20 mg x 2 days, 10 mg x 2 days) - Augmentin 1 tab twice daily x 7 days  Orders: - CXR today  Follow-up - If symptoms do not improve or worsen

## 2020-12-23 ENCOUNTER — Ambulatory Visit: Payer: Medicare Other | Admitting: Adult Health

## 2021-01-26 ENCOUNTER — Telehealth: Payer: Self-pay | Admitting: Pulmonary Disease

## 2021-01-26 NOTE — Telephone Encounter (Signed)
Second booster has not been recommended at this time

## 2021-01-26 NOTE — Telephone Encounter (Signed)
Called and spoke with pt and she stated that she tried to go to the pharmacy to get another booster ( her last booster was 09/21/2020) and she stated that her pharmacy told her that she does not qualify to get a second booster since she is not immunocompromised.  Pt thought that she was.  She is going to check with  to see if they would let her get the second booster, but she also wanted RA to give his recs as well.  Please advise. Thanks

## 2021-01-27 NOTE — Telephone Encounter (Signed)
Call made to patient, verified DOB. Made aware of MD Alva recommedations. Voiced understanding. Nothing further needed at this time.

## 2021-02-11 ENCOUNTER — Telehealth: Payer: Self-pay | Admitting: Pulmonary Disease

## 2021-02-11 NOTE — Telephone Encounter (Signed)
Spoke with pt  She is c/o increased cough x 2 wks  She states cough is prod with clear sputum and she is also blowing out clear mucus from her nose  She denies any SOB, wheezing, CP, sore throat, HA, f/c/s, body aches  Has been vaccinated against covid x 3  She is still taking her anoro daily, has not had to use her albuterol more than usual  She states she no longer takes the atrovent NS bc this made her throat extremely dry- I took this off of her MAR She is using mucinex daily  Requesting something to help with the cough  Please advise, thank you!

## 2021-02-11 NOTE — Telephone Encounter (Signed)
Spoke with the pt and notified of response per Dr Alva  She verbalized understanding  Will call if not improving  

## 2021-02-11 NOTE — Telephone Encounter (Signed)
Chlor-Trimeton 4 mg nightly x 1 week, this might make her sleepy Delsym 5 mL over-the-counter twice daily as needed for cough

## 2021-02-18 ENCOUNTER — Telehealth: Payer: Self-pay | Admitting: Pulmonary Disease

## 2021-02-18 MED ORDER — PREDNISONE 10 MG PO TABS: ORAL_TABLET | ORAL | 0 refills | Status: DC

## 2021-02-18 MED ORDER — DOXYCYCLINE HYCLATE 100 MG PO TABS: 100.0000 mg | ORAL_TABLET | Freq: Every day | ORAL | 0 refills | Status: DC

## 2021-02-18 NOTE — Telephone Encounter (Signed)
I called and spoke with pt and she stated that she is not any better from last week.  She stated that she could not use the delsym due to the dyes it has in it so she started using mucinex.  She stated that she knows that she has bronchitis and needs something other than the treatment that she has been doing.  She is still wheezing and coughing. She denies any fever or any other symptoms. She wanted to know if something can be called in ?  Abx? Prednisone?  RA please advise. Thanks   Allergies  Allergen Reactions  . Aspartame And Phenylalanine Hives  . Blue Dyes (Parenteral)   . Chocolate Diarrhea  . Gelatin Itching    Any gel caps  . Latex   . Red Dye   . Adhesive [Tape] Rash  . Sudafed [Pseudoephedrine Hcl] Anxiety    Makes her "drunk"

## 2021-02-18 NOTE — Telephone Encounter (Signed)
I have called the pt and she is aware of meds that have been sent to the pharmacy. She stated that she will see how she does on these meds and will call next week if she feels that she needs to come in for appt.

## 2021-02-18 NOTE — Telephone Encounter (Signed)
Doxycycline 100 mg daily for 7 days Prednisone 10 mg tabs  Take 2 tabs daily with food x 5ds, then 1 tab daily with food x 5ds then STOP  Please offer her office visit with APP next week

## 2021-09-29 ENCOUNTER — Telehealth: Payer: Self-pay | Admitting: Pulmonary Disease

## 2021-09-29 NOTE — Telephone Encounter (Signed)
Current RA patient, would like to switch to JD as her son is a JD patient.  Please advise on provider switch.   Routing to providers and copying triage.

## 2021-09-30 NOTE — Telephone Encounter (Signed)
Left voicemail for patient to call back to schedule new patient schedule with Dr. Francine Graven.

## 2021-10-28 ENCOUNTER — Other Ambulatory Visit: Payer: Self-pay

## 2021-10-28 ENCOUNTER — Ambulatory Visit: Payer: Medicare Other | Admitting: Pulmonary Disease

## 2021-10-28 ENCOUNTER — Encounter: Payer: Self-pay | Admitting: Pulmonary Disease

## 2021-10-28 VITALS — BP 126/70 | HR 101 | Ht 65.5 in | Wt 116.4 lb

## 2021-10-28 DIAGNOSIS — R053 Chronic cough: Secondary | ICD-10-CM | POA: Diagnosis not present

## 2021-10-28 DIAGNOSIS — R0982 Postnasal drip: Secondary | ICD-10-CM

## 2021-10-28 DIAGNOSIS — J449 Chronic obstructive pulmonary disease, unspecified: Secondary | ICD-10-CM | POA: Diagnosis not present

## 2021-10-28 MED ORDER — IPRATROPIUM BROMIDE 0.03 % NA SOLN
1.0000 | Freq: Two times a day (BID) | NASAL | 12 refills | Status: DC
Start: 2021-10-28 — End: 2021-12-01

## 2021-10-28 MED ORDER — FLUTICASONE PROPIONATE HFA 110 MCG/ACT IN AERO
2.0000 | INHALATION_SPRAY | Freq: Two times a day (BID) | RESPIRATORY_TRACT | 12 refills | Status: DC
Start: 2021-10-28 — End: 2023-03-19

## 2021-10-28 NOTE — Progress Notes (Signed)
Synopsis: Referred in November 2022 for COPD, former patient of Dr. Vassie Loll.  Subjective:   PATIENT ID: Virginia [name] GENDER: female DOB: 03/14/1941, MRN: [id number]   HPI  Chief Complaint  Patient presents with   Establish Care    Former RA patient for COPD. States her breathing has gotten worse since last visit, increased coughing and wheezing. Phlegm is white.     Virginia [name] is an 80 year old woman, former smoker with OCPD, allergic rhinitis and chronic cough who returns to pulmonary clinic to establish care with new provider.   PFTs 2021 showed ratio 41%, FEV1 0.81L (40%), FVC 1.88L (69%), no significant bronchodilator response. DLCO 33%.   She has been anoro ellipta daily and as needed albuterol. She did not tolerate Breztri in the past due to hypertension.  She was treated for COPD exacerbation 02/2021 with doxycycline and prednisone.  She continues to have chronic cough due to post nasal drainage. She stopped using ipratropium nasal spray 0.06% due to mouth dryness. She is taking Allegra nightly, chlortrimeton nightly, mucinex nightly for her cough. She is also using flonase daily and saline spray PRN.   Past Medical History:  Diagnosis Date   Allergic    Asthma    Osteopenia      Family History  Problem Relation Age of Onset   Asthma Father      Social History   Socioeconomic History   Marital status: Divorced    Spouse name: Not on file   Number of children: Not on file   Years of education: Not on file   Highest education level: Not on file  Occupational History   Not on file  Tobacco Use   Smoking status: Former    Packs/day: 2.00    Years: 42.00    Pack years: 84.00    Types: Cigarettes    Start date: 12/18/1954    Quit date: 07/12/2002    Years since quitting: 19.3   Smokeless tobacco: Never   Tobacco comments:    Quit smoking for about 5 years - 1974-1979  Substance and Sexual Activity   Alcohol use: No   Drug use: No   Sexual activity:  Not on file  Other Topics Concern   Not on file  Social History Narrative   Not on file   Social Determinants of Health   Financial Resource Strain: Not on file  Food Insecurity: Not on file  Transportation Needs: Not on file  Physical Activity: Not on file  Stress: Not on file  Social Connections: Not on file  Intimate Partner Violence: Not on file     Allergies  Allergen Reactions   Aspartame And Phenylalanine Hives   Blue Dyes (Parenteral)    Chocolate Diarrhea   Gelatin Itching    Any gel caps   Latex    Red Dye    Adhesive [Tape] Rash   Sudafed [Pseudoephedrine Hcl] Anxiety    Makes her "drunk"     Outpatient Medications Prior to Visit  Medication Sig Dispense Refill   albuterol (VENTOLIN HFA) 108 (90 Base) MCG/ACT inhaler Inhale 1 puff into the lungs every 6 (six) hours as needed for wheezing or shortness of breath.     calcium carbonate (TUMS - DOSED IN MG ELEMENTAL CALCIUM) 500 MG chewable tablet Chew 1 tablet by mouth daily.     denosumab (PROLIA) 60 MG/ML SOLN injection Inject 60 mg into the skin every 6 (six) months. Administer in upper arm, thigh, or abdomen  dextromethorphan-guaiFENesin (MUCINEX DM) 30-600 MG 12hr tablet Take 1 tablet by mouth 2 (two) times daily.     fexofenadine (ALLEGRA) 30 MG tablet Take 30 mg by mouth 2 (two) times daily.     fluticasone (FLONASE) 50 MCG/ACT nasal spray Place 1 spray into both nostrils daily.     magnesium oxide (MAG-OX) 400 MG tablet Take 400 mg by mouth daily.     Umeclidinium-Vilanterol (ANORO ELLIPTA IN) Inhale into the lungs once.     ipratropium (ATROVENT) 0.06 % nasal spray Place 1 spray into both nostrils daily. 15 mL 12   albuterol (PROVENTIL) (2.5 MG/3ML) 0.083% nebulizer solution Take 2.5 mg by nebulization every 6 (six) hours as needed. (Patient not taking: Reported on 12/08/2020)     amoxicillin-clavulanate (AUGMENTIN) 875-125 MG tablet Take 1 tablet by mouth 2 (two) times daily. 14 tablet 0   azithromycin  (ZITHROMAX Z-PAK) 250 MG tablet Take 2 tabs today, then 1 daily until gone (Patient not taking: Reported on 12/08/2020) 6 each 0   doxycycline (VIBRA-TABS) 100 MG tablet Take 1 tablet (100 mg total) by mouth daily. 7 tablet 0   predniSONE (DELTASONE) 10 MG tablet Take 4 tabs po daily x 2 days; then 3 tabs for 2 days; then 2 tabs for 2 days; then 1 tab for 2 days 20 tablet 0   predniSONE (DELTASONE) 10 MG tablet 2 tabs daily with food x 5 days, then 1 tab daily x 5 days then stop 15 tablet 0   No facility-administered medications prior to visit.    Review of Systems  Constitutional:  Positive for malaise/fatigue. Negative for chills, fever and weight loss.  HENT:  Positive for congestion. Negative for sinus pain and sore throat.   Eyes: Negative.   Respiratory:  Positive for cough and shortness of breath. Negative for hemoptysis, sputum production and wheezing.   Cardiovascular:  Negative for chest pain, palpitations, orthopnea, claudication and leg swelling.  Gastrointestinal:  Negative for abdominal pain, heartburn, nausea and vomiting.  Genitourinary: Negative.   Musculoskeletal:  Negative for joint pain and myalgias.  Skin:  Negative for rash.  Neurological:  Negative for weakness.  Endo/Heme/Allergies: Negative.   Psychiatric/Behavioral: Negative.     Objective:   Vitals:   10/28/21 1124  BP: 126/70  Pulse: (!) 101  SpO2: 95%  Weight: 116 lb 6.4 oz (52.8 kg)  Height: 5' 5.5" (1.664 m)   Physical Exam Constitutional:      General: She is not in acute distress.    Appearance: She is not ill-appearing.     Comments: thin  HENT:     Head: Normocephalic and atraumatic.  Eyes:     General: No scleral icterus.    Conjunctiva/sclera: Conjunctivae normal.     Pupils: Pupils are equal, round, and reactive to light.  Cardiovascular:     Rate and Rhythm: Normal rate and regular rhythm.     Pulses: Normal pulses.     Heart sounds: Normal heart sounds. No murmur heard. Pulmonary:      Effort: Pulmonary effort is normal.     Breath sounds: Decreased breath sounds present. No wheezing, rhonchi or rales.  Abdominal:     General: Bowel sounds are normal.     Palpations: Abdomen is soft.  Musculoskeletal:     Right lower leg: No edema.     Left lower leg: No edema.  Lymphadenopathy:     Cervical: No cervical adenopathy.  Skin:    General: Skin is warm and dry.  Neurological:     General: No focal deficit present.     Mental Status: She is alert.  Psychiatric:        Mood and Affect: Mood normal.        Behavior: Behavior normal.        Thought Content: Thought content normal.        Judgment: Judgment normal.   CBC    Component Value Date/Time   WBC 7.7 10/15/2020 1652   RBC 4.98 10/15/2020 1652   HGB 14.7 10/15/2020 1652   HCT 44.8 10/15/2020 1652   PLT 186 10/15/2020 1652   MCV 90.0 10/15/2020 1652   MCH 29.5 10/15/2020 1652   MCHC 32.8 10/15/2020 1652   RDW 13.2 10/15/2020 1652   LYMPHSABS 2,402 10/15/2020 1652   EOSABS 177 10/15/2020 1652   BASOSABS 69 10/15/2020 1652   No flowsheet data found.   Chest imaging: CXR 12/08/20 The chest is hyperexpanded and there is peribronchial thickening, unchanged. No consolidative process, pneumothorax or effusion. Heart size is normal. Marked convex right thoracolumbar scoliosis again seen. Calcified breast implants also again noted.  PFT: PFT Results Latest Ref Rng & Units 10/15/2020  FVC-Pre L 1.88  FVC-Predicted Pre % 69  FVC-Post L 2.00  FVC-Predicted Post % 74  Pre FEV1/FVC % % 40  Post FEV1/FCV % % 41  FEV1-Pre L 0.76  FEV1-Predicted Pre % 37  FEV1-Post L 0.81  DLCO uncorrected ml/min/mmHg 6.58  DLCO UNC% % 33  DLCO corrected ml/min/mmHg 6.58  DLCO COR %Predicted % 33  DLVA Predicted % 52    Labs: 10/15/20: Alpha 1 184, IgE 55, peripheral eosinophils 177  Path:  Echo:  Heart Catheterization:   Assessment & Plan:   Chronic obstructive pulmonary disease, unspecified COPD type  (HCC) - Plan: fluticasone (FLOVENT HFA) 110 MCG/ACT inhaler  Chronic cough  Post-nasal drip - Plan: ipratropium (ATROVENT) 0.03 % nasal spray  Discussion: Virginia [name] is an 80 year old woman, former smoker with OCPD, allergic rhinitis and chronic cough who returns to pulmonary clinic to establish care with new provider.   She has severe obstructive defect and severe diffusion defect based on PFTs from 2021. She is very limited from a physical activity standpoint due to dyspnea. She is currently using anoro ellipta daily and albuterol as needed. She has not tolerated Breztri trials in the past due to side effects. We will try adding flovent 2 puffs twice daily to her anoro and monitor for any improvement in dyspnea.   She has not qualified for oxygen upon simple walk in the past. We will check overnight oximetry test.  Her post-nasal drainage is contributing to her chronic productive cough. We will try ipratropium nasasl spray 1-2 sprays twice daily as needed. She is to continue flonase, allegra and chlortrimeton for her cough.    Follow up in 3 months.   Virginia Comas, MD [facility] Office: [phone]   Current Outpatient Medications:    albuterol (VENTOLIN HFA) 108 (90 Base) MCG/ACT inhaler, Inhale 1 puff into the lungs every 6 (six) hours as needed for wheezing or shortness of breath., Disp: , Rfl:    calcium carbonate (TUMS - DOSED IN MG ELEMENTAL CALCIUM) 500 MG chewable tablet, Chew 1 tablet by mouth daily., Disp: , Rfl:    denosumab (PROLIA) 60 MG/ML SOLN injection, Inject 60 mg into the skin every 6 (six) months. Administer in upper arm, thigh, or abdomen, Disp: , Rfl:    dextromethorphan-guaiFENesin (MUCINEX  DM) 30-600 MG 12hr tablet, Take 1 tablet by mouth 2 (two) times daily., Disp: , Rfl:    fexofenadine (ALLEGRA) 30 MG tablet, Take 30 mg by mouth 2 (two) times daily., Disp: , Rfl:    fluticasone (FLONASE) 50 MCG/ACT nasal spray, Place 1  spray into both nostrils daily., Disp: , Rfl:    fluticasone (FLOVENT HFA) 110 MCG/ACT inhaler, Inhale 2 puffs into the lungs 2 (two) times daily., Disp: 1 each, Rfl: 12   ipratropium (ATROVENT) 0.03 % nasal spray, Place 1-2 sprays into both nostrils every 12 (twelve) hours., Disp: 30 mL, Rfl: 12   magnesium oxide (MAG-OX) 400 MG tablet, Take 400 mg by mouth daily., Disp: , Rfl:    Umeclidinium-Vilanterol (ANORO ELLIPTA IN), Inhale into the lungs once., Disp: , Rfl:

## 2021-10-28 NOTE — Patient Instructions (Addendum)
Continue anoro ellipta 1 puff daily  Start flovent inhaler 2 puffs twice daily - rinse your mouth out after each use - stop if you notice similar symptoms that you noticed when trying Breztri inhaler   Continue to use albuterol inhaler as needed 1-2 puffs every 4-6 hours   We will refer you to pulmonary rehab  Use ipratropium nasal spray 1-2 sprays per nostril twice daily as needed for post-nasal drainage.

## 2021-11-04 ENCOUNTER — Telehealth: Payer: Self-pay | Admitting: Pulmonary Disease

## 2021-11-04 NOTE — Telephone Encounter (Signed)
JD please advise.   thanks 

## 2021-11-04 NOTE — Telephone Encounter (Signed)
Pt wanted to let Flovent is working great- breathing improved 50-75%. Pulm Rehab at [facility] is backlogged 4 months, not sure that'll help pt at all. Wanting to know if this is the only pulm rehab in [place]. Pt states she needs [place] to add to medication list- Valcyclovir- pt has shingles in eye. Pt wanting to know what medication he wanted her o stop- she stopped the chlortrimeton but isn't sure if that's what she was supposed to stop or not. Pt has improved breathing but pt has "horrendous cough- worse than ever." Sometimes productive and sometimes really dry. Pt wondering if the Atrovent is what is causing the cough. Please advise [phone]

## 2021-11-09 NOTE — Telephone Encounter (Signed)
Please make sure patient is rinsing her mouth out after each use of flovent incase this is contributing to cough.   She is to continue flonase, allegra and chlortrimeton for her cough as outlined per our visit.   If the cough is worse since our visit, the new medications were ipratropium nasal spray and the flovent.   She can stop the ipratropium if she feels this is adding to cough and monitor for changes.         I have called and spoke with pt and she is aware of JD recs.  She is going to check with [facility] to se if they have pulm rehab and if they have a waiting time.  [facility] is 4 months out.  She will contact us back if she decides to go to [facility].

## 2021-11-30 ENCOUNTER — Telehealth: Payer: Self-pay | Admitting: Pulmonary Disease

## 2021-11-30 NOTE — Telephone Encounter (Signed)
Called patient but she did not answer. Left message to call back.  

## 2021-12-01 ENCOUNTER — Encounter: Payer: Self-pay | Admitting: Nurse Practitioner

## 2021-12-01 ENCOUNTER — Ambulatory Visit (INDEPENDENT_AMBULATORY_CARE_PROVIDER_SITE_OTHER): Payer: Medicare Other

## 2021-12-01 ENCOUNTER — Other Ambulatory Visit: Payer: Self-pay

## 2021-12-01 ENCOUNTER — Ambulatory Visit: Payer: Medicare Other | Admitting: Nurse Practitioner

## 2021-12-01 VITALS — BP 132/74 | HR 81 | Temp 98.1°F | Ht 65.0 in | Wt 117.4 lb

## 2021-12-01 DIAGNOSIS — U071 COVID-19: Secondary | ICD-10-CM

## 2021-12-01 DIAGNOSIS — J441 Chronic obstructive pulmonary disease with (acute) exacerbation: Secondary | ICD-10-CM | POA: Diagnosis not present

## 2021-12-01 DIAGNOSIS — J449 Chronic obstructive pulmonary disease, unspecified: Secondary | ICD-10-CM | POA: Diagnosis not present

## 2021-12-01 DIAGNOSIS — J302 Other seasonal allergic rhinitis: Secondary | ICD-10-CM | POA: Diagnosis not present

## 2021-12-01 LAB — POC COVID19 BINAXNOW: SARS Coronavirus 2 Ag: POSITIVE — AB

## 2021-12-01 MED ORDER — PREDNISONE 10 MG PO TABS: ORAL_TABLET | ORAL | 0 refills | Status: DC

## 2021-12-01 MED ORDER — MOLNUPIRAVIR EUA 200MG CAPSULE: 4.0000 | ORAL_CAPSULE | Freq: Two times a day (BID) | ORAL | 0 refills | Status: AC

## 2021-12-01 NOTE — Patient Instructions (Addendum)
Your COVID test today was positive.  -Start molnupiravir 800 mg Twice daily for 5 days. Take with food. -Prednisone taper pack. 4 tabs for 2 days, then 3 tabs for 2 days, 2 tabs for 2 days, then 1 tab for 2 days, then stop. Take in AM with food. -Seek emergency care if worsening shortness of breath, inability to keep food or liquids down, worsening generalized weakness develop.  -Isolate at home for five days (12/14 was day 1 so you will be out of isolation on Monday). Wear a mask when around others or in a crowd for an additional five days after. -Monitor oxygen saturation at home. Notify if below 88-90%  -Continue Anoro Ellipta 1 puff daily -Continue Flovent HFA 2 puffs Twice daily, brush tongue and rinse mouth afterwards -Continue albuterol 1-2 puffs every 6 hours as needed for shortness of breath/wheezing -Continue Mucinex DM Twice daily  -Continue Allegra Twice daily  -Continue Flonase 1 spray each nostril daily -Continue ipatropium nasal spray 1-2 sprays each nostril Twice daily   -Saline nasal spray 2-3 times a day as needed for nasal congestion/postnasal drip until symptoms improve -Mucinex 600 mg twice daily as needed until symptoms improve -Chlortab 4mg  over the counter at night as needed for cough  -Tylenol over the counter as directed for fever and/or pain -Drink plenty of fluids. Can add electrolyte packet such as Liquid IV to fluids.   Chest x ray today. We will notify you of results.   Notify if worsening breathlessness, cough, mucus production, fatigue, or wheezing occurs.  Maintain up to date vaccinations, including influenza, COVID, and pneumococcal.  Wash your hands often and avoid sick exposures.  Encouraged masking in crowds.  Avoid triggers, when possible.  Exercise, as tolerated. Notify if worsening symptoms upon exertion occur.  Follow up in a week with Dr. , Francine Graven, NP or APP. If symptoms do not improve or worsen, please contact office for sooner  follow up or seek emergency care.

## 2021-12-01 NOTE — Telephone Encounter (Signed)
Patient is returning phone call. Patient phone number is [phone].

## 2021-12-01 NOTE — Telephone Encounter (Signed)
We are glad to see  Please set up video visit or in person at 4 pm with Cobb

## 2021-12-01 NOTE — Telephone Encounter (Signed)
Called and spoke to pt. Pt c/o dry cough, PND, fatigue, intermittent wheeze, oral temp of 99.6 (last night of 1007.7). Pt states her cough started on 12/13 but knew she was getting sick when s/s worsened yesterday, 12/14. Pt has not taken antipyretics. Pt denies SOB, CP/tightness, chest congestion. Pt is taking Mucinex. Pt states she was at a [employer] on 12/11. Pt states she received her flu shot this year and has been vaccinated against covid. Dr. Francine Graven is unavailable, will send to provider on call.   Tammy, please advise. Thanks.

## 2021-12-01 NOTE — Progress Notes (Signed)
@Patient  ID: Virginia [name], female    DOB: 1941-01-01, 80 y.o.   MRN: [id number]  Chief Complaint  Patient presents with   Acute Visit    Dry cough started 12/14, temperature last pm was 100.7, today 99.7.  No chest congestion.  No more nasal congestion that normal.  HA between eyes, stays there, never goes away.     Referring provider: Jilda Panda, MD  HPI: 80 year old female, former smoker (74 pack years) followed for COPD, allergic rhinitis and chronic cough.  She is a patient of Dr. August Albino and was last seen in office on 10/28/2021.  Past medical history significant for hypertension and osteopenia.  TEST/EVENTS:  10/15/2020 PFTs: FVC 2 (74), FEV1 0.81 (40), ratio 41, DLCO uncorrected 6.58 (33).  Unable to complete lung volumes.  Severe obstructive airway disease 12/08/2020 CXR 2 view: Chest is hyperexpanded, peribronchial thickening.  Compatible with COPD.  No acute change.  10/28/2021: OV with Dr. Erin Fulling.  Persistent chronic cough related to postnasal drainage.  Previously stopped ipratropium nasal spray due to mouth dryness.  Added Flovent HFA.  Restarted ipratropium nasal spray.  Continued on Anoro Ellipta and as needed albuterol.  Previously unable to tolerate Breztri due to side effects. ONO ordered.  Continue Flonase, Allegra and Chlortab at night  12/01/2021: Today - acute sick visit Patient presents today for cough and fatigue. Her symptoms began on 12/13 and have progressively worsened over the past two days. She describes her cough as dry and she has an intermittent wheeze. She is experiencing paroxysmal nocturnal dyspnea and increased shortness of breath with exertion that started today. She has been having increased fatigue and low grade fevers with T max of 100.7. Her nasal congestion and rhinorrhea is unchanged from baseline. She has a constant, dull headache. She was recently at a [employer] on 12/11 but is denies any known sick exposures. She denies chest pain,  lower extremity swelling, worsening nasal congestion, or sore throat. She continues on [employer] daily and Flovent Twice daily. She reports that her breathing had improved significantly after starting the Flovent last month, prior to this new onset of symptoms. She continues on Flonase nasal spray and Allegra. She stopped the ipatropium spray after improvement on the Flovent inhaler. She has been taking mucinex DM with minimal relief of symptoms. She has not taken any OTC antipyretics. She has required increased use of her albuterol inhaler over the past few days. Overall, she feels poorly.   Allergies  Allergen Reactions   Aspartame And Phenylalanine Hives   Blue Dyes (Parenteral)    Chocolate Diarrhea   Gelatin Itching    Any gel caps   Latex    Red Dye    Adhesive [Tape] Rash   Sudafed [Pseudoephedrine Hcl] Anxiety    Makes her "drunk"    Immunization History  Administered Date(s) Administered   Fluad Quad(high Dose 65+) 09/28/2021   Influenza-Unspecified 09/03/2020   PFIZER(Purple Top)SARS-COV-2 Vaccination 01/07/2020, 01/26/2020, 09/21/2020    Past Medical History:  Diagnosis Date   Allergic    Asthma    Osteopenia     Tobacco History: Social History   Tobacco Use  Smoking Status Former   Packs/day: 2.00   Years: 42.00   Pack years: 84.00   Types: Cigarettes   Start date: 12/18/1954   Quit date: 07/12/2002   Years since quitting: 19.4  Smokeless Tobacco Never  Tobacco Comments   Quit smoking for about 5 years - 1974-1979   Counseling given: Not Answered  Tobacco comments: Quit smoking for about 5 years - 1974-1979   Outpatient Medications Prior to Visit  Medication Sig Dispense Refill   albuterol (VENTOLIN HFA) 108 (90 Base) MCG/ACT inhaler Inhale 1 puff into the lungs every 6 (six) hours as needed for wheezing or shortness of breath.     calcium carbonate (TUMS - DOSED IN MG ELEMENTAL CALCIUM) 500 MG chewable tablet Chew 1 tablet by mouth daily.      dextromethorphan-guaiFENesin (MUCINEX DM) 30-600 MG 12hr tablet Take 1 tablet by mouth 2 (two) times daily.     fexofenadine (ALLEGRA) 30 MG tablet Take 30 mg by mouth 2 (two) times daily.     fluticasone (FLONASE) 50 MCG/ACT nasal spray Place 1 spray into both nostrils daily.     fluticasone (FLOVENT HFA) 110 MCG/ACT inhaler Inhale 2 puffs into the lungs 2 (two) times daily. 1 each 12   magnesium oxide (MAG-OX) 400 MG tablet Take 400 mg by mouth daily.     Umeclidinium-Vilanterol (ANORO ELLIPTA IN) Inhale into the lungs once.     denosumab (PROLIA) 60 MG/ML SOLN injection Inject 60 mg into the skin every 6 (six) months. Administer in upper arm, thigh, or abdomen (Patient not taking: Reported on 12/01/2021)     ipratropium (ATROVENT) 0.03 % nasal spray Place 1-2 sprays into both nostrils every 12 (twelve) hours. (Patient not taking: Reported on 12/01/2021) 30 mL 12   No facility-administered medications prior to visit.     Review of Systems:   Constitutional: No weight loss or gain, night sweats. +fevers, chills, fatigue, lassitude HEENT: No difficulty swallowing, tooth/dental problems, or sore throat. No sneezing, itching, ear ache. +nasal congestion (unchanged), rhinorrhea (unchanged); headaches (constant, dull) CV:  +PND. No chest pain, orthopnea, swelling in lower extremities, anasarca, dizziness, palpitations, syncope Resp: +shortness of breath with exertion; wheezing; non-productive cough. No excess mucus or change in color of mucus. No hemoptysis.  No chest wall deformity GI:  No heartburn, indigestion, abdominal pain, nausea, vomiting, diarrhea, change in bowel habits, loss of appetite, bloody stools.  GU: No dysuria, change in color of urine, urgency or frequency.  No flank pain, no hematuria  Skin: No rash, lesions, ulcerations MSK:  No joint pain or swelling.  No decreased range of motion.  No back pain. Neuro: No dizziness or lightheadedness.  Psych: No depression or anxiety. Mood  stable.     Physical Exam:  BP 132/74 (BP Location: Right Arm, Patient Position: Sitting, Cuff Size: Normal)    Pulse 81    Temp 98.1 F (36.7 C) (Oral)    Ht 5\' 5"  (1.651 m)    Wt 117 lb 6.4 oz (53.3 kg)    SpO2 90%    BMI 19.54 kg/m   GEN: Pleasant, interactive, acutely-ill appearing; in no acute distress. HEENT:  Normocephalic and atraumatic. EACs patent bilaterally. TM pearly gray with present light reflex bilaterally. PERRLA. Sclera white. Nasal turbinates pink, moist and patent bilaterally. No rhinorrhea present. Oropharynx erythematous and moist, without exudate or edema. No lesions, ulcerations NECK:  Supple w/ fair ROM. No JVD present. Normal carotid impulses w/o bruits. Thyroid symmetrical with no goiter or nodules palpated. No lymphadenopathy.   CV: RRR, no m/r/g, no peripheral edema. Pulses intact, +2 bilaterally. No cyanosis, pallor or clubbing. PULMONARY:  Unlabored, regular breathing. Diminished throughout P w/o wheezes/rales/rhonchi. No accessory muscle use. No dullness to percussion. GI: BS present and normoactive. Soft, non-tender to palpation. No organomegaly or masses detected. No CVA tenderness. MSK: No erythema, warmth  or tenderness. Cap refil <2 sec all extrem. No deformities or joint swelling noted.  Neuro: A/Ox3. No focal deficits noted.   Skin: Warm, no lesions or rashe Psych: Normal affect and behavior. Judgement and thought content appropriate.     Lab Results:  CBC    Component Value Date/Time   WBC 7.7 10/15/2020 1652   RBC 4.98 10/15/2020 1652   HGB 14.7 10/15/2020 1652   HCT 44.8 10/15/2020 1652   PLT 186 10/15/2020 1652   MCV 90.0 10/15/2020 1652   MCH 29.5 10/15/2020 1652   MCHC 32.8 10/15/2020 1652   RDW 13.2 10/15/2020 1652   LYMPHSABS 2,402 10/15/2020 1652   EOSABS 177 10/15/2020 1652   BASOSABS 69 10/15/2020 1652    BMET No results found for: NA, K, CL, CO2, GLUCOSE, BUN, CREATININE, CALCIUM, GFRNONAA, GFRAA  BNP No results found  for: BNP   Imaging:  No results found.    PFT Results Latest Ref Rng & Units 10/15/2020  FVC-Pre L 1.88  FVC-Predicted Pre % 69  FVC-Post L 2.00  FVC-Predicted Post % 74  Pre FEV1/FVC % % 40  Post FEV1/FCV % % 41  FEV1-Pre L 0.76  FEV1-Predicted Pre % 37  FEV1-Post L 0.81  DLCO uncorrected ml/min/mmHg 6.58  DLCO UNC% % 33  DLCO corrected ml/min/mmHg 6.58  DLCO COR %Predicted % 33  DLVA Predicted % 52    No results found for: NITRICOXIDE      Assessment & Plan:   COVID-19 virus infection Positive today. Antiviral therapy initiated with molnupiravir for 5 days. Prednisone 2 day taper pack. CXR today. Supportive care. Close follow up. ED precautions.   Patient Instructions  Your COVID test today was positive.  -Start molnupiravir 800 mg Twice daily for 5 days. Take with food. -Prednisone taper pack. 4 tabs for 2 days, then 3 tabs for 2 days, 2 tabs for 2 days, then 1 tab for 2 days, then stop. Take in AM with food. -Seek emergency care if worsening shortness of breath, inability to keep food or liquids down, worsening generalized weakness develop.  -Isolate at home for five days (12/14 was day 1 so you will be out of isolation on Monday). Wear a mask when around others or in a crowd for an additional five days after. -Monitor oxygen saturation at home. Notify if below 88-90%  -Continue Anoro Ellipta 1 puff daily -Continue Flovent HFA 2 puffs Twice daily, brush tongue and rinse mouth afterwards -Continue albuterol 1-2 puffs every 6 hours as needed for shortness of breath/wheezing -Continue Mucinex DM Twice daily  -Continue Allegra Twice daily  -Continue Flonase 1 spray each nostril daily -Continue ipatropium nasal spray 1-2 sprays each nostril Twice daily   -Saline nasal spray 2-3 times a day as needed for nasal congestion/postnasal drip until symptoms improve -Mucinex 600 mg twice daily as needed until symptoms improve -Chlortab 4mg  over the counter at night as  needed for cough  -Tylenol over the counter as directed for fever and/or pain -Drink plenty of fluids. Can add electrolyte packet such as Liquid IV to fluids.   Chest x ray today. We will notify you of results.   Notify if worsening breathlessness, cough, mucus production, fatigue, or wheezing occurs.  Maintain up to date vaccinations, including influenza, COVID, and pneumococcal.  Wash your hands often and avoid sick exposures.  Encouraged masking in crowds.  Avoid triggers, when possible.  Exercise, as tolerated. Notify if worsening symptoms upon exertion occur.  Follow up in a  week with Dr. Francine Graven, Rhunette Croft, NP or APP. If symptoms do not improve or worsen, please contact office for sooner follow up or seek emergency care.    Allergic rhinitis Continue current maintenance regimen. See above plan.  COPD with chronic bronchitis and emphysema (HCC) High risk with COVID 19 infection. Antiviral therapy initiated. Prednisone taper pack. Strict ED precautions. See above plan.    Noemi Chapel, NP 12/01/2021  Pt aware and understands NP's role.

## 2021-12-01 NOTE — Assessment & Plan Note (Signed)
Continue current maintenance regimen. See above plan.

## 2021-12-01 NOTE — Assessment & Plan Note (Addendum)
Positive today. Antiviral therapy initiated with molnupiravir for 5 days. Prednisone 2 day taper pack. CXR today. Supportive care. Close follow up. ED precautions.   Patient Instructions  Your COVID test today was positive.  -Start molnupiravir 800 mg Twice daily for 5 days. Take with food. -Prednisone taper pack. 4 tabs for 2 days, then 3 tabs for 2 days, 2 tabs for 2 days, then 1 tab for 2 days, then stop. Take in AM with food. -Seek emergency care if worsening shortness of breath, inability to keep food or liquids down, worsening generalized weakness develop.  -Isolate at home for five days (12/14 was day 1 so you will be out of isolation on Monday). Wear a mask when around others or in a crowd for an additional five days after. -Monitor oxygen saturation at home. Notify if below 88-90%  -Continue Anoro Ellipta 1 puff daily -Continue Flovent HFA 2 puffs Twice daily, brush tongue and rinse mouth afterwards -Continue albuterol 1-2 puffs every 6 hours as needed for shortness of breath/wheezing -Continue Mucinex DM Twice daily  -Continue Allegra Twice daily  -Continue Flonase 1 spray each nostril daily -Continue ipatropium nasal spray 1-2 sprays each nostril Twice daily   -Saline nasal spray 2-3 times a day as needed for nasal congestion/postnasal drip until symptoms improve -Mucinex 600 mg twice daily as needed until symptoms improve -Chlortab 4mg  over the counter at night as needed for cough  -Tylenol over the counter as directed for fever and/or pain -Drink plenty of fluids. Can add electrolyte packet such as Liquid IV to fluids.   Chest x ray today. We will notify you of results.   Notify if worsening breathlessness, cough, mucus production, fatigue, or wheezing occurs.  Maintain up to date vaccinations, including influenza, COVID, and pneumococcal.  Wash your hands often and avoid sick exposures.  Encouraged masking in crowds.  Avoid triggers, when possible.  Exercise, as  tolerated. Notify if worsening symptoms upon exertion occur.  Follow up in a week with Dr. , Francine Graven, NP or APP. If symptoms do not improve or worsen, please contact office for sooner follow up or seek emergency care.

## 2021-12-01 NOTE — Assessment & Plan Note (Signed)
High risk with COVID 19 infection. Antiviral therapy initiated. Prednisone taper pack. Strict ED precautions. See above plan.

## 2021-12-01 NOTE — Telephone Encounter (Signed)
Called and spoke to pt. Appt scheduled for 4:00pm today to see Micheline Maze, NP.   Will forward to [facility] as FYI of appt.

## 2021-12-02 ENCOUNTER — Telehealth: Payer: Self-pay | Admitting: *Deleted

## 2021-12-02 NOTE — Telephone Encounter (Signed)
Patient stated she was returning a call, she believes that it may have been an automated reminder call for her upcoming appointment.  Advised I did not see where anyone had called her and it did not appear that her cxr showed any acute findings.  Advised her we would call her if there was anything we needed to make her aware of.  She stated that Wednesday she is supposed to help her son with a christmas party and wanted to know if it would be ok for her to go.  I advised her that she needs to quarantine for at least 5 days or until she no longer has any symptoms and then to wear a mask for 5 days.  She verbalized understanding.  Nothing further needed.

## 2021-12-05 ENCOUNTER — Telehealth: Payer: Self-pay | Admitting: Nurse Practitioner

## 2021-12-05 NOTE — Telephone Encounter (Signed)
Called and spoke with pt who states she took the 6th dose of her molnupiravir as well as taking the prednisone. Pt stated yesterday 12/18 after she had taken the meds, she noticed while she was sitting, her pulse was around 140-150 but when she got up to walk around, her pulse went back down to normal range around 82.  Pt also said Thurs. 12/15 after she got the meds, when she took her first dose she was very jittery feeling.  Pt said when she went to bed overnight, her pulse remained normal range 75-82 and states so far today it is still normal range. Pt stated that she has not taken the prednisone nor the molnupiravir today nor has she used her Flovent due to the steroids in it. Pt said that she has taken her Anoro.  Pt said that she does feel much better now.  Due to the side effects that she was having, pt wants to know if it would be okay for her to take the prednisone and the molnupiravir. Katie, please advise.

## 2021-12-05 NOTE — Telephone Encounter (Signed)
Called and spoke with pt letting her know the recs per Florentina Addison and she verbalized understanding. Nothing further needed.

## 2021-12-05 NOTE — Telephone Encounter (Signed)
The prednisone is likely what is causing the jittery sensation, which is a normal side effect. She should be decreasing her prednisone dose with the taper. Have her take 20 mg of prednisone today (instead of 30mg ) and continue with 20 mg tomorrow and Wednesday, then decrease to 10 mg for two days, then stop. Advise her to monitor her heart rate; it could have been an inaccurate reading on the device. If she notices an increase again, she can stop the prednisone. Continue the molnupiravir as this is not a known side effect. She needs to continue her inhalers, as ordered! Glad she is feeling better! Thanks!

## 2021-12-07 ENCOUNTER — Ambulatory Visit: Payer: Medicare Other | Admitting: Pulmonary Disease

## 2021-12-07 ENCOUNTER — Encounter: Payer: Self-pay | Admitting: Pulmonary Disease

## 2021-12-07 ENCOUNTER — Other Ambulatory Visit: Payer: Self-pay

## 2021-12-07 VITALS — BP 138/74 | HR 97 | Ht 65.0 in | Wt 117.0 lb

## 2021-12-07 DIAGNOSIS — J449 Chronic obstructive pulmonary disease, unspecified: Secondary | ICD-10-CM

## 2021-12-07 DIAGNOSIS — U071 COVID-19: Secondary | ICD-10-CM

## 2021-12-07 NOTE — Patient Instructions (Addendum)
Continue anoro ellipta 1 puff daily  Continue flovent inhaler 2 puffs twice daily  Continue to use albuterol inhaler as needed 1-2 puffs every 4-6 hours    Use ipratropium nasal spray 1-2 sprays per nostril twice daily as needed for post-nasal drainage.

## 2021-12-07 NOTE — Progress Notes (Signed)
Synopsis: Referred in November 2022 for COPD, former patient of Dr. Vassie Loll.  Subjective:   PATIENT ID: Virginia [name] GENDER: female DOB: February 26, 1941, MRN: [id number]  HPI  Chief Complaint  Patient presents with   Follow-up    1 wk f/u after testing positive for covid last week. States she has been feeling well since last visit.    Virginia [name] is an 80 year old woman, former smoker with OCPD, allergic rhinitis and chronic cough who returns to pulmonary clinic for follow up.   She was seen by Rhunette Croft, NP on 12/01/21 for acute visit. She tested postive for covid 19. She was started on prednisone taper and a course of molnupiravir. She is feeling much better.   She was started on flovent and anoro at the prior visit with improvement in her breathing. She reports she is able to walk more with her son.   OV 10/28/21 PFTs 2021 showed ratio 41%, FEV1 0.81L (40%), FVC 1.88L (69%), no significant bronchodilator response. DLCO 33%.   She has been anoro ellipta daily and as needed albuterol. She did not tolerate Breztri in the past due to hypertension.  She was treated for COPD exacerbation 02/2021 with doxycycline and prednisone.  She continues to have chronic cough due to post nasal drainage. She stopped using ipratropium nasal spray 0.06% due to mouth dryness. She is taking Allegra nightly, chlortrimeton nightly, mucinex nightly for her cough. She is also using flonase daily and saline spray PRN.   Past Medical History:  Diagnosis Date   Allergic    Asthma    Osteopenia      Family History  Problem Relation Age of Onset   Asthma Father      Social History   Socioeconomic History   Marital status: Divorced    Spouse name: Not on file   Number of children: Not on file   Years of education: Not on file   Highest education level: Not on file  Occupational History   Not on file  Tobacco Use   Smoking status: Former    Packs/day: 2.00    Years: 42.00    Pack years:  84.00    Types: Cigarettes    Start date: 12/18/1954    Quit date: 07/12/2002    Years since quitting: 19.4   Smokeless tobacco: Never   Tobacco comments:    Quit smoking for about 5 years - 1974-1979  Substance and Sexual Activity   Alcohol use: No   Drug use: No   Sexual activity: Not on file  Other Topics Concern   Not on file  Social History Narrative   Not on file   Social Determinants of Health   Financial Resource Strain: Not on file  Food Insecurity: Not on file  Transportation Needs: Not on file  Physical Activity: Not on file  Stress: Not on file  Social Connections: Not on file  Intimate Partner Violence: Not on file     Allergies  Allergen Reactions   Aspartame And Phenylalanine Hives   Blue Dyes (Parenteral)    Chocolate Diarrhea   Gelatin Itching    Any gel caps   Latex    Red Dye    Adhesive [Tape] Rash   Sudafed [Pseudoephedrine Hcl] Anxiety    Makes her "drunk"     Outpatient Medications Prior to Visit  Medication Sig Dispense Refill   albuterol (VENTOLIN HFA) 108 (90 Base) MCG/ACT inhaler Inhale 1 puff into the lungs every 6 (six) hours  as needed for wheezing or shortness of breath.     calcium carbonate (TUMS - DOSED IN MG ELEMENTAL CALCIUM) 500 MG chewable tablet Chew 1 tablet by mouth daily.     denosumab (PROLIA) 60 MG/ML SOLN injection Inject 60 mg into the skin every 6 (six) months. Administer in upper arm, thigh, or abdomen     dextromethorphan-guaiFENesin (MUCINEX DM) 30-600 MG 12hr tablet Take 1 tablet by mouth 2 (two) times daily.     fexofenadine (ALLEGRA) 30 MG tablet Take 30 mg by mouth 2 (two) times daily.     fluticasone (FLONASE) 50 MCG/ACT nasal spray Place 1 spray into both nostrils daily.     fluticasone (FLOVENT HFA) 110 MCG/ACT inhaler Inhale 2 puffs into the lungs 2 (two) times daily. 1 each 12   magnesium oxide (MAG-OX) 400 MG tablet Take 400 mg by mouth daily.     predniSONE (DELTASONE) 10 MG tablet Take 4 tabs by mouth for 2  days, then 3 tabs for 2 days, 2 tabs for 2 days, then 1 tab for 2 days, then stop. Take in AM with food. 20 tablet 0   Umeclidinium-Vilanterol (ANORO ELLIPTA IN) Inhale into the lungs once.     No facility-administered medications prior to visit.   Review of Systems  Constitutional:  Positive for malaise/fatigue. Negative for chills, fever and weight loss.  HENT:  Positive for congestion. Negative for sinus pain and sore throat.   Eyes: Negative.   Respiratory:  Positive for cough and shortness of breath. Negative for hemoptysis, sputum production and wheezing.   Cardiovascular:  Negative for chest pain, palpitations, orthopnea, claudication and leg swelling.  Gastrointestinal:  Negative for abdominal pain, heartburn, nausea and vomiting.  Genitourinary: Negative.   Musculoskeletal:  Negative for joint pain and myalgias.  Skin:  Negative for rash.  Neurological:  Negative for weakness.  Endo/Heme/Allergies: Negative.   Psychiatric/Behavioral: Negative.     Objective:   Vitals:   12/07/21 1525  BP: 138/74  Pulse: 97  SpO2: 94%  Weight: 117 lb (53.1 kg)  Height:  (1.651 m)   Physical Exam Constitutional:      General: She is not in acute distress.    Appearance: She is not ill-appearing.     Comments: thin  HENT:     Head: Normocephalic and atraumatic.  Eyes:     General: No scleral icterus.    Conjunctiva/sclera: Conjunctivae normal.  Cardiovascular:     Rate and Rhythm: Normal rate and regular rhythm.     Pulses: Normal pulses.     Heart sounds: Normal heart sounds. No murmur heard. Pulmonary:     Effort: Pulmonary effort is normal.     Breath sounds: Decreased breath sounds present. No wheezing, rhonchi or rales.  Musculoskeletal:     Right lower leg: No edema.     Left lower leg: No edema.  Skin:    General: Skin is warm and dry.  Neurological:     General: No focal deficit present.     Mental Status: She is alert.  Psychiatric:        Mood and Affect:  Mood normal.        Behavior: Behavior normal.        Thought Content: Thought content normal.        Judgment: Judgment normal.   CBC    Component Value Date/Time   WBC 7.7 10/15/2020 1652   RBC 4.98 10/15/2020 1652   HGB 14.7 10/15/2020 1652  HCT 44.8 10/15/2020 1652   PLT 186 10/15/2020 1652   MCV 90.0 10/15/2020 1652   MCH 29.5 10/15/2020 1652   MCHC 32.8 10/15/2020 1652   RDW 13.2 10/15/2020 1652   LYMPHSABS 2,402 10/15/2020 1652   EOSABS 177 10/15/2020 1652   BASOSABS 69 10/15/2020 1652   No flowsheet data found.  Chest imaging: CXR 12/08/20 The chest is hyperexpanded and there is peribronchial thickening, unchanged. No consolidative process, pneumothorax or effusion. Heart size is normal. Marked convex right thoracolumbar scoliosis again seen. Calcified breast implants also again noted.  PFT: PFT Results Latest Ref Rng & Units 10/15/2020  FVC-Pre L 1.88  FVC-Predicted Pre % 69  FVC-Post L 2.00  FVC-Predicted Post % 74  Pre FEV1/FVC % % 40  Post FEV1/FCV % % 41  FEV1-Pre L 0.76  FEV1-Predicted Pre % 37  FEV1-Post L 0.81  DLCO uncorrected ml/min/mmHg 6.58  DLCO UNC% % 33  DLCO corrected ml/min/mmHg 6.58  DLCO COR %Predicted % 33  DLVA Predicted % 52    Labs: 10/15/20: Alpha 1 184, IgE 55, peripheral eosinophils 177  Path:  Echo:  Heart Catheterization:   Assessment & Plan:   Chronic obstructive pulmonary disease, unspecified COPD type (HCC)  COVID-19 virus infection  Discussion: Virginia [name] is an 80 year old woman, former smoker with OCPD, allergic rhinitis and chronic cough who returns to pulmonary clinic for follow up.   She has severe obstructive defect and severe diffusion defect based on PFTs from 2021. She is very limited from a physical activity standpoint due to dyspnea which she reports improved with the addition of flovent to anoro. I have encouraged her to increase her walking as able.   She is to continue current inhaler  regimen.   She has been referred to pulmonary rehab in the past and is on the waiting list.   She has not qualified for oxygen upon simple walk in the past. We will check overnight oximetry test.  Her post-nasal drainage is contributing to her chronic productive cough. We will try ipratropium nasasl spray 1-2 sprays twice daily as needed. She is to continue flonase, allegra and chlortrimeton for her cough.    Follow up in February as scheduled.  Melody Comas, MD [facility] Office: [phone]   Current Outpatient Medications:    albuterol (VENTOLIN HFA) 108 (90 Base) MCG/ACT inhaler, Inhale 1 puff into the lungs every 6 (six) hours as needed for wheezing or shortness of breath., Disp: , Rfl:    calcium carbonate (TUMS - DOSED IN MG ELEMENTAL CALCIUM) 500 MG chewable tablet, Chew 1 tablet by mouth daily., Disp: , Rfl:    denosumab (PROLIA) 60 MG/ML SOLN injection, Inject 60 mg into the skin every 6 (six) months. Administer in upper arm, thigh, or abdomen, Disp: , Rfl:    dextromethorphan-guaiFENesin (MUCINEX DM) 30-600 MG 12hr tablet, Take 1 tablet by mouth 2 (two) times daily., Disp: , Rfl:    fexofenadine (ALLEGRA) 30 MG tablet, Take 30 mg by mouth 2 (two) times daily., Disp: , Rfl:    fluticasone (FLONASE) 50 MCG/ACT nasal spray, Place 1 spray into both nostrils daily., Disp: , Rfl:    fluticasone (FLOVENT HFA) 110 MCG/ACT inhaler, Inhale 2 puffs into the lungs 2 (two) times daily., Disp: 1 each, Rfl: 12   magnesium oxide (MAG-OX) 400 MG tablet, Take 400 mg by mouth daily., Disp: , Rfl:    predniSONE (DELTASONE) 10 MG tablet, Take 4 tabs by mouth for 2  days, then 3 tabs for 2 days, 2 tabs for 2 days, then 1 tab for 2 days, then stop. Take in AM with food., Disp: 20 tablet, Rfl: 0   Umeclidinium-Vilanterol (ANORO ELLIPTA IN), Inhale into the lungs once., Disp: , Rfl:

## 2022-01-30 ENCOUNTER — Encounter: Payer: Self-pay | Admitting: Pulmonary Disease

## 2022-01-30 ENCOUNTER — Ambulatory Visit (INDEPENDENT_AMBULATORY_CARE_PROVIDER_SITE_OTHER): Payer: Medicare Other | Admitting: Pulmonary Disease

## 2022-01-30 ENCOUNTER — Other Ambulatory Visit: Payer: Self-pay

## 2022-01-30 VITALS — BP 118/76 | HR 83 | Ht 65.0 in | Wt 119.8 lb

## 2022-01-30 DIAGNOSIS — J302 Other seasonal allergic rhinitis: Secondary | ICD-10-CM

## 2022-01-30 DIAGNOSIS — J449 Chronic obstructive pulmonary disease, unspecified: Secondary | ICD-10-CM | POA: Diagnosis not present

## 2022-01-30 MED ORDER — MONTELUKAST SODIUM 10 MG PO TABS: 10.0000 mg | ORAL_TABLET | Freq: Every day | ORAL | 11 refills | Status: DC

## 2022-01-30 NOTE — Patient Instructions (Signed)
We will check on the order for your overnight oximetry since it has not been done yet.   Continue Anoro Ellipta 1 puff daily  Continue flovent 2 puffs twice daily - rinse mouth out after each use  Continue to use albuterol as needed  Continue allegra allergy medicine daily  Continue flonase nasal spray daily  Start montelukast 10mg  daily for your allergies and COPD.

## 2022-01-30 NOTE — Progress Notes (Signed)
Synopsis: Referred in November 2022 for COPD, former patient of Dr. Vassie Loll.  Subjective:   PATIENT ID: Virginia [name] GENDER: female DOB: Apr 29, 1941, MRN: [id number]  HPI  Chief Complaint  Patient presents with   Follow-up    2 mo f/u  after having COVID. States she is having headaches and SOB.    Virginia [name] is an 81 year old woman, former smoker with COPD, allergic rhinitis and chronic cough who returns to pulmonary clinic for follow up.   She continues to have some more dyspnea than prior to her covid infection. She is using Anoro 1 puff daily and flovent 2 puffs twice daily. She continues to have nose and sinus drainage which leads to cough.   She has not had the overnight oximitry test done yet.   OV 12/07/21 She was seen by Rhunette Croft, NP on 12/01/21 for acute visit. She tested postive for covid 19. She was started on prednisone taper and a course of molnupiravir. She is feeling much better.   She was started on flovent and anoro at the prior visit with improvement in her breathing. She reports she is able to walk more with her son.   OV 10/28/21 PFTs 2021 showed ratio 41%, FEV1 0.81L (40%), FVC 1.88L (69%), no significant bronchodilator response. DLCO 33%.   She has been anoro ellipta daily and as needed albuterol. She did not tolerate Breztri in the past due to hypertension.  She was treated for COPD exacerbation 02/2021 with doxycycline and prednisone.  She continues to have chronic cough due to post nasal drainage. She stopped using ipratropium nasal spray 0.06% due to mouth dryness. She is taking Allegra nightly, chlortrimeton nightly, mucinex nightly for her cough. She is also using flonase daily and saline spray PRN.   Past Medical History:  Diagnosis Date   Allergic    Asthma    Osteopenia      Family History  Problem Relation Age of Onset   Asthma Father      Social History   Socioeconomic History   Marital status: Divorced    Spouse name: Not on  file   Number of children: Not on file   Years of education: Not on file   Highest education level: Not on file  Occupational History   Not on file  Tobacco Use   Smoking status: Former    Packs/day: 2.00    Years: 42.00    Pack years: 84.00    Types: Cigarettes    Start date: 12/18/1954    Quit date: 07/12/2002    Years since quitting: 19.5   Smokeless tobacco: Never   Tobacco comments:    Quit smoking for about 5 years - 1974-1979  Substance and Sexual Activity   Alcohol use: No   Drug use: No   Sexual activity: Not on file  Other Topics Concern   Not on file  Social History Narrative   Not on file   Social Determinants of Health   Financial Resource Strain: Not on file  Food Insecurity: Not on file  Transportation Needs: Not on file  Physical Activity: Not on file  Stress: Not on file  Social Connections: Not on file  Intimate Partner Violence: Not on file     Allergies  Allergen Reactions   Aspartame And Phenylalanine Hives   Blue Dyes (Parenteral)    Chocolate Diarrhea   Gelatin Itching    Any gel caps   Latex    Red Dye  Adhesive [Tape] Rash   Sudafed [Pseudoephedrine Hcl] Anxiety    Makes her "drunk"     Outpatient Medications Prior to Visit  Medication Sig Dispense Refill   albuterol (VENTOLIN HFA) 108 (90 Base) MCG/ACT inhaler Inhale 1 puff into the lungs every 6 (six) hours as needed for wheezing or shortness of breath.     calcium carbonate (TUMS - DOSED IN MG ELEMENTAL CALCIUM) 500 MG chewable tablet Chew 1 tablet by mouth daily.     denosumab (PROLIA) 60 MG/ML SOLN injection Inject 60 mg into the skin every 6 (six) months. Administer in upper arm, thigh, or abdomen     dextromethorphan-guaiFENesin (MUCINEX DM) 30-600 MG 12hr tablet Take 1 tablet by mouth 2 (two) times daily.     fexofenadine (ALLEGRA) 30 MG tablet Take 30 mg by mouth 2 (two) times daily.     fluticasone (FLONASE) 50 MCG/ACT nasal spray Place 1 spray into both nostrils daily.      fluticasone (FLOVENT HFA) 110 MCG/ACT inhaler Inhale 2 puffs into the lungs 2 (two) times daily. 1 each 12   magnesium oxide (MAG-OX) 400 MG tablet Take 400 mg by mouth daily.     Umeclidinium-Vilanterol (ANORO ELLIPTA IN) Inhale into the lungs once.     predniSONE (DELTASONE) 10 MG tablet Take 4 tabs by mouth for 2 days, then 3 tabs for 2 days, 2 tabs for 2 days, then 1 tab for 2 days, then stop. Take in AM with food. 20 tablet 0   No facility-administered medications prior to visit.   Review of Systems  Constitutional:  Negative for chills, fever, malaise/fatigue and weight loss.  HENT:  Positive for congestion. Negative for sinus pain and sore throat.   Eyes: Negative.   Respiratory:  Positive for cough and shortness of breath. Negative for hemoptysis, sputum production and wheezing.   Cardiovascular:  Negative for chest pain, palpitations, orthopnea, claudication and leg swelling.  Gastrointestinal:  Negative for abdominal pain, heartburn, nausea and vomiting.  Genitourinary: Negative.   Musculoskeletal:  Negative for joint pain and myalgias.  Skin:  Negative for rash.  Neurological:  Negative for weakness.  Endo/Heme/Allergies: Negative.   Psychiatric/Behavioral: Negative.     Objective:   Vitals:   01/30/22 1028  BP: 118/76  Pulse: 83  SpO2: 96%  Weight: 119 lb 12.8 oz (54.3 kg)  Height: 5\' 5"  (1.651 m)   Physical Exam Constitutional:      General: She is not in acute distress.    Appearance: She is not ill-appearing.     Comments: thin  HENT:     Head: Normocephalic and atraumatic.  Eyes:     General: No scleral icterus.    Conjunctiva/sclera: Conjunctivae normal.  Cardiovascular:     Rate and Rhythm: Normal rate and regular rhythm.     Pulses: Normal pulses.     Heart sounds: Normal heart sounds. No murmur heard. Pulmonary:     Effort: Pulmonary effort is normal.     Breath sounds: Decreased breath sounds present. No wheezing, rhonchi or rales.   Musculoskeletal:     Right lower leg: No edema.     Left lower leg: No edema.  Skin:    General: Skin is warm and dry.  Neurological:     General: No focal deficit present.     Mental Status: She is alert.  Psychiatric:        Mood and Affect: Mood normal.        Behavior: Behavior normal.  Thought Content: Thought content normal.        Judgment: Judgment normal.   CBC    Component Value Date/Time   WBC 7.7 10/15/2020 1652   RBC 4.98 10/15/2020 1652   HGB 14.7 10/15/2020 1652   HCT 44.8 10/15/2020 1652   PLT 186 10/15/2020 1652   MCV 90.0 10/15/2020 1652   MCH 29.5 10/15/2020 1652   MCHC 32.8 10/15/2020 1652   RDW 13.2 10/15/2020 1652   LYMPHSABS 2,402 10/15/2020 1652   EOSABS 177 10/15/2020 1652   BASOSABS 69 10/15/2020 1652   No flowsheet data found.  Chest imaging: CXR 12/08/20 The chest is hyperexpanded and there is peribronchial thickening, unchanged. No consolidative process, pneumothorax or effusion. Heart size is normal. Marked convex right thoracolumbar scoliosis again seen. Calcified breast implants also again noted.  PFT: PFT Results Latest Ref Rng & Units 10/15/2020  FVC-Pre L 1.88  FVC-Predicted Pre % 69  FVC-Post L 2.00  FVC-Predicted Post % 74  Pre FEV1/FVC % % 40  Post FEV1/FCV % % 41  FEV1-Pre L 0.76  FEV1-Predicted Pre % 37  FEV1-Post L 0.81  DLCO uncorrected ml/min/mmHg 6.58  DLCO UNC% % 33  DLCO corrected ml/min/mmHg 6.58  DLCO COR %Predicted % 33  DLVA Predicted % 52   Labs: 10/15/20: Alpha 1 184, IgE 55, peripheral eosinophils 177  Path:  Echo:  Heart Catheterization:  Assessment & Plan:   Chronic obstructive pulmonary disease, unspecified COPD type (HCC)  Seasonal allergic rhinitis, unspecified trigger - Plan: montelukast (SINGULAIR) 10 MG tablet  Discussion: Jessicca Stitzer is an 81 year old woman, former smoker with COPD, allergic rhinitis and chronic cough who returns to pulmonary clinic for follow up.   She  has severe obstructive defect and severe diffusion defect based on PFTs from 2021. She continues to have ongoing dyspnea with exertion. She is to continue anoro ellipta 1 puff daily and flovent 2 puffs twice daily. She can continue to use albuterol inhaler as needed.   She has been referred to pulmonary rehab in the past and is on the waiting list.   We will check overnight oximetry test.  Her post-nasal drainage is contributing to her chronic productive cough. She did not tolerate ipratropium nasal spray provided to her after last visit as it dried her out too much.   We will start her on montelukast 10mg  daily for her seasonal allergies.  Follow up in 6 months.  , MD [facility] Office: [phone]   Current Outpatient Medications:    albuterol (VENTOLIN HFA) 108 (90 Base) MCG/ACT inhaler, Inhale 1 puff into the lungs every 6 (six) hours as needed for wheezing or shortness of breath., Disp: , Rfl:    calcium carbonate (TUMS - DOSED IN MG ELEMENTAL CALCIUM) 500 MG chewable tablet, Chew 1 tablet by mouth daily., Disp: , Rfl:    denosumab (PROLIA) 60 MG/ML SOLN injection, Inject 60 mg into the skin every 6 (six) months. Administer in upper arm, thigh, or abdomen, Disp: , Rfl:    dextromethorphan-guaiFENesin (MUCINEX DM) 30-600 MG 12hr tablet, Take 1 tablet by mouth 2 (two) times daily., Disp: , Rfl:    fexofenadine (ALLEGRA) 30 MG tablet, Take 30 mg by mouth 2 (two) times daily., Disp: , Rfl:    fluticasone (FLONASE) 50 MCG/ACT nasal spray, Place 1 spray into both nostrils daily., Disp: , Rfl:    fluticasone (FLOVENT HFA) 110 MCG/ACT inhaler, Inhale 2 puffs into the lungs 2 (two) times  daily., Disp: 1 each, Rfl: 12   magnesium oxide (MAG-OX) 400 MG tablet, Take 400 mg by mouth daily., Disp: , Rfl:    montelukast (SINGULAIR) 10 MG tablet, Take 1 tablet (10 mg total) by mouth at bedtime., Disp: 30 tablet, Rfl: 11   Umeclidinium-Vilanterol (ANORO  ELLIPTA IN), Inhale into the lungs once., Disp: , Rfl:

## 2022-06-05 ENCOUNTER — Telehealth: Payer: Self-pay | Admitting: *Deleted

## 2022-06-05 ENCOUNTER — Ambulatory Visit (INDEPENDENT_AMBULATORY_CARE_PROVIDER_SITE_OTHER): Payer: Medicare Other | Admitting: Adult Health

## 2022-06-05 ENCOUNTER — Encounter: Payer: Self-pay | Admitting: Adult Health

## 2022-06-05 DIAGNOSIS — J449 Chronic obstructive pulmonary disease, unspecified: Secondary | ICD-10-CM | POA: Diagnosis not present

## 2022-06-05 MED ORDER — AZITHROMYCIN 250 MG PO TABS: ORAL_TABLET | ORAL | 0 refills | Status: AC

## 2022-06-05 MED ORDER — ALBUTEROL SULFATE (2.5 MG/3ML) 0.083% IN NEBU
2.5000 mg | INHALATION_SOLUTION | Freq: Once | RESPIRATORY_TRACT | Status: AC
  Administered 2022-06-05: 2.5 mg via RESPIRATORY_TRACT

## 2022-06-05 MED ORDER — PREDNISONE 20 MG PO TABS: 20.0000 mg | ORAL_TABLET | Freq: Every day | ORAL | 0 refills | Status: DC

## 2022-06-05 NOTE — Telephone Encounter (Signed)
Called and spoke with patient, advised that Tammy said that a lot of times insurance will not pay for an ONO during a sick visit and we could address it at her f/u appointment.  She verbalized understanding.  Nothing further needed.

## 2022-06-05 NOTE — Addendum Note (Signed)
Addended by: Delrae Rend on: 06/05/2022 03:29 PM   Modules accepted: Orders

## 2022-06-05 NOTE — Assessment & Plan Note (Signed)
Acute exacerbation -treat with empiric antibiotics and short steroid burst.  Treat with albuterol neb in office x 1   Plan  Patient Instructions  Zpack take as directed. Take with food.  Mucinex DM Twice daily  As needed  cough/congestion  Prednisone 20mg  daily for 5 days , take with food.  Continue on Anoro and Flovent  Albuterol inhaler As needed   Follow up with Dewald as planned and As needed   Please contact office for sooner follow up if symptoms do not improve or worsen or seek emergency care

## 2022-06-05 NOTE — Patient Instructions (Signed)
Zpack take as directed. Take with food.  Mucinex DM Twice daily  As needed  cough/congestion  Prednisone 20mg  daily for 5 days , take with food.  Continue on Anoro and Flovent  Albuterol inhaler As needed   Follow up with Dewald as planned and As needed   Please contact office for sooner follow up if symptoms do not improve or worsen or seek emergency care

## 2022-06-05 NOTE — Progress Notes (Signed)
@Patient  ID: , female    DOB: November 29, 1941, 81 y.o.   MRN: [id number]  Chief Complaint  Patient presents with   Acute Visit    Referring provider: [id number], MD  HPI: 81 year old female former smoker followed for COPD and allergic rhinitis  TEST/EVENTS :  PFTs 2021 showed ratio 41%, FEV1 0.81L (40%), FVC 1.88L (69%), no significant bronchodilator response. DLCO 33%.   06/05/2022 Acute OV : COPD  Patient presents for an acute office visit.  She complains of 1 week of  increased cough with thick productive mucus, shortness of breath.  Was sick 3 weeks ago with bronchitis , seen by PCP and given Depo Medrol injection and steroid taper Says symptoms resolved.  1 week ago cough restarted. Covid home test was negative 2 days ago. She denies any fever, hemoptysis, chest pain, orthopnea or edema. Appetite is good.  She has underlying severe COPD is on Anoro daily.,  Flovent twice daily and Singulair daily.    Allergies  Allergen Reactions   Aspartame And Phenylalanine Hives   Blue Dyes (Parenteral)    Chocolate Diarrhea   Gelatin Itching    Any gel caps   Latex    Red Dye    Adhesive [Tape] Rash   Sudafed [Pseudoephedrine Hcl] Anxiety    Makes her "drunk"    Immunization History  Administered Date(s) Administered   Fluad Quad(high Dose 65+) 09/28/2021   Influenza-Unspecified 09/03/2020   PFIZER(Purple Top)SARS-COV-2 Vaccination 01/07/2020, 01/26/2020, 09/21/2020    Past Medical History:  Diagnosis Date   Allergic    Asthma    Osteopenia     Tobacco History: Social History   Tobacco Use  Smoking Status Former   Packs/day: 2.00   Years: 42.00   Total pack years: 84.00   Types: Cigarettes   Start date: 12/18/1954   Quit date: 07/12/2002   Years since quitting: 19.9  Smokeless Tobacco Never  Tobacco Comments   Quit smoking for about 5 years - 1974-1979   Counseling given: Not Answered Tobacco comments: Quit smoking for about 5 years -  1974-1979   Outpatient Medications Prior to Visit  Medication Sig Dispense Refill   albuterol (VENTOLIN HFA) 108 (90 Base) MCG/ACT inhaler Inhale 1 puff into the lungs every 6 (six) hours as needed for wheezing or shortness of breath.     ANORO ELLIPTA 62.5-25 MCG/ACT AEPB Inhale 1 puff into the lungs daily.     calcium carbonate (TUMS - DOSED IN MG ELEMENTAL CALCIUM) 500 MG chewable tablet Chew 1 tablet by mouth daily.     dextromethorphan-guaiFENesin (MUCINEX DM) 30-600 MG 12hr tablet Take 1 tablet by mouth 2 (two) times daily.     fexofenadine (ALLEGRA) 30 MG tablet Take 30 mg by mouth 2 (two) times daily.     fluticasone (FLOVENT HFA) 110 MCG/ACT inhaler Inhale 2 puffs into the lungs 2 (two) times daily. 1 each 12   latanoprost (XALATAN) 0.005 % ophthalmic solution 1 drop at bedtime.     magnesium oxide (MAG-OX) 400 MG tablet Take 400 mg by mouth daily.     montelukast (SINGULAIR) 10 MG tablet Take 1 tablet (10 mg total) by mouth at bedtime. 30 tablet 11   Umeclidinium-Vilanterol (ANORO ELLIPTA IN) Inhale into the lungs once.     denosumab (PROLIA) 60 MG/ML SOLN injection Inject 60 mg into the skin every 6 (six) months. Administer in upper arm, thigh, or abdomen     fluticasone (FLONASE) 50 MCG/ACT nasal spray Place 1 spray  into both nostrils daily.     No facility-administered medications prior to visit.     Review of Systems:   Constitutional:   No  weight loss, night sweats,  Fevers, chills,  +fatigue, or  lassitude.  HEENT:   No headaches,  Difficulty swallowing,  Tooth/dental problems, or  Sore throat,                No sneezing, itching, ear ache,  +nasal congestion, post nasal drip,   CV:  No chest pain,  Orthopnea, PND, swelling in lower extremities, anasarca, dizziness, palpitations, syncope.   GI  No heartburn, indigestion, abdominal pain, nausea, vomiting, diarrhea, change in bowel habits, loss of appetite, bloody stools.   Resp:   No chest wall deformity  Skin: no  rash or lesions.  GU: no dysuria, change in color of urine, no urgency or frequency.  No flank pain, no hematuria   MS:  No joint pain or swelling.  No decreased range of motion.  No back pain.    Physical Exam  BP 124/74 (BP Location: Left Arm, Cuff Size: Normal)   Pulse 94   Ht 5' 5.5" (1.664 m)   Wt 122 lb 4.8 oz (55.5 kg)   SpO2 90%   BMI 20.04 kg/m   GEN: A/Ox3; pleasant , NAD, well nourished    HEENT:  [place]/AT,  NOSE-clear, THROAT-clear, no lesions, no postnasal drip or exudate noted.   NECK:  Supple w/ fair ROM; no JVD; normal carotid impulses w/o bruits; no thyromegaly or nodules palpated; no lymphadenopathy.    RESP  scattered rhonchi   no accessory muscle use, no dullness to percussion  CARD:  RRR, no m/r/g, no peripheral edema, pulses intact, no cyanosis or clubbing.  GI:   Soft & nt; nml bowel sounds; no organomegaly or masses detected.   Musco: Warm bil, no deformities or joint swelling noted.   Neuro: alert, no focal deficits noted.    Skin: Warm, no lesions or rashes    Lab Results: Imaging: No results found.       Latest Ref Rng & Units 10/15/2020    2:53 PM  PFT Results  FVC-Pre L 1.88   FVC-Predicted Pre % 69   FVC-Post L 2.00   FVC-Predicted Post % 74   Pre FEV1/FVC % % 40   Post FEV1/FCV % % 41   FEV1-Pre L 0.76   FEV1-Predicted Pre % 37   FEV1-Post L 0.81   DLCO uncorrected ml/min/mmHg 6.58   DLCO UNC% % 33   DLCO corrected ml/min/mmHg 6.58   DLCO COR %Predicted % 33   DLVA Predicted % 52     No results found for: "NITRICOXIDE"      Assessment & Plan:   COPD with chronic bronchitis and emphysema (HCC) Acute exacerbation -treat with empiric antibiotics and short steroid burst.  Treat with albuterol neb in office x 1   Plan  Patient Instructions  Zpack take as directed. Take with food.  Mucinex DM Twice daily  As needed  cough/congestion  Prednisone 20mg  daily for 5 days , take with food.  Continue on Anoro and Flovent   Albuterol inhaler As needed   Follow up with [facility] as planned and As needed   Please contact office for sooner follow up if symptoms do not improve or worsen or seek emergency care          , NP 06/05/2022

## 2022-09-18 NOTE — Progress Notes (Signed)
New Patient Note  RE: Virginia [name] MRN: [id number] DOB: 1941-08-02 Date of Office Visit: 09/19/2022  Consult requested by: Ralene Ok, MD Primary care provider: Ralene Ok, MD  Chief Complaint: Urticaria (Has hive today - started 3-4 weeks ago. She was given a shot of dexamethazone and 9 days of xyzal. It was helping and 3 days later it came back. She was then given 9 days of prednisone it helped ( ended last Saturday). Since she finished the prednisone has been taking benadryl daily. )  History of Present Illness: I had the pleasure of seeing Virginia [name] for initial evaluation at the [facility] on 09/19/2022. She is a 81 y.o. female, who is referred here by Ralene Ok, MD for the evaluation of hives.  Current rash flare started on 08/25/2022. Mainly occurs on her neck, face, arms. Few bumps on the legs and torso. Describes them as itchy, red, raised and warm to the touch at times. Individual rashes lasts until she had steroids - she had IM injection and oral course as well.   No ecchymosis upon resolution. Associated symptoms include: none.  Frequency of episodes: unknown. Suspected triggers are unknown but she is allergic to red dye, aspartame, gelatin, latex. Patient had the flu shot 2 days prior and started to take simbrinza eye drops.  She stopped the eye drops 3 days later but she is still having the itchy rash.  Patient usually gets the flu shot every year with no issues but apparently this was a different one?  She also mentions that she was treated with Valtrex for Shingles on the right face 1 month prior. She believes her Shingles is back because she is feeling some pain on the right side - but there is no rash at this time.  Denies any fevers, chills, foods, personal care products. She has tried the following therapies: benadryl, prednisone with good benefit. Currently on benadryl 25mg -50mg  every 4-6 hours. She also takes allegra daily for the  past 4+ years due to her other "allergic" issues.  Previous work up includes: saw dermatologist in the past for different rashes. She had skin biopsy for other reasons in the past as per the EMR. She does not recall getting skin biopsies though.  Previous history of rash/hives: yes starting 10-15 years ago.  Patient is up to date with the following cancer screening tests: physical exam, colonoscopy. Not up to date with mammogram, pap.   In 1976 she was stung by something similar to a jellyfish and about 1 year later she noticed that she is "allergic" to her own blood - meaning anywhere she is bleeding and the blood touches her skin will turn into a rash.  Lives alone. Denies any bug/insect infestations.   Medical history significant for osteoporosis and copd.   Assessment and Plan: Waneda is a 81 y.o. female with: Rash and other nonspecific skin eruption Recently broke out in pruritic rash 1 month ago. No triggers noted. Only systemic steroids seem to be helping. Using antihistamines with minimal benefit. Had flu shot 2 days prior and started on a new eye drop which she stopped since then. Mentions being treated for shingles 1 month prior to rash onset. History of rashes for 10+ years with various triggers - red dye, aspartame, gelatin and latex. Seen by dermatology in the past.  Discussed with patient that based on clinical history and physical exam I'm not sure what's causing her current rash.  See below for proper skin  care. Take pictures of the rash flares. Continue to avoid things that give you issues - red dye, blue dye, gelatin, paprika, chocolate, latex.  Start allegra (fexofenadine) 180mg  twice a day. This replaces benadryl.  If symptoms are not controlled or causes drowsiness let know. Start Pepcid (famotidine) 20mg  twice a day.  Avoid the following potential triggers: alcohol, tight clothing, NSAIDs, hot showers and getting overheated. Get bloodwork to rule out other  etiologies.  If negative and rash still persistent then will refer to dermatology next.  Use hydrocortisone 1% cream twice a day as needed for mild rash flares - okay to use on the face, neck, groin area. Do not use more than 1 week at a time. Samples given. Use triamcinolone 0.1% ointment twice a day as needed for rash flares. Do not use on the face, neck, armpits or groin area. Do not use more than 3 weeks in a row.   Other adverse food reactions, not elsewhere classified, subsequent encounter Red dye, gelatin, aspartame causes rash/itching. Chocolate, paprika causes diarrhea. No prior evaluation. Continue to avoid. Get bloodwork.   Chronic rhinitis History of sinus issues and did not tolerate skin prick testing in the past.  Get bloodwork for environmental panel as per patient request.   Return in about 4 weeks (around 10/17/2022).  Meds ordered this encounter  Medications   famotidine (PEPCID) 20 MG tablet    Sig: Take 1 tablet (20 mg total) by mouth 2 (two) times daily.    Dispense:  60 tablet    Refill:  2   triamcinolone ointment (KENALOG) 0.1 %    Sig: Apply 1 Application topically 2 (two) times daily as needed (rash flare). Do not use on the face, neck, armpits or groin area. Do not use more than 3 weeks in a row.    Dispense:  30 g    Refill:  1   Lab Orders         Allergen, Quinoline Yellow IgE         ALLERGEN, ANNATTO SEED         Alpha-Gal Panel         ANA w/Reflex         Allergens w/Total IgE Area 2         Sedimentation rate         Tryptase         C-reactive protein         Chronic Urticaria         CBC with Differential/Platelet         C3 and C4         Comprehensive metabolic panel         Allergen, Red ([place]) Dye, [id number]         Protein electrophoresis, serum         Protein Electrophoresis, Urine Rflx.         Thyroid Cascade Profile         Latex, IgE         Gelatin,IgE         Chocolate IgE         Allergen,Grn Pepper,Paprika,[id number]       Other allergy screening: Asthma:  follows with pulmonology for COPD. Rhino conjunctivitis: no Had allergy skin testing done in the past due to her sinus issues but denies any current sinus symptoms however would like to get bloodwork for environmental testing as she did not tolerates the skin testing in the  past.  Food allergy: yes Avoiding blue dye?, red dye, gelatin causes rash/itching. Avoiding chocolate, paprika due to diarrhea.  Dietary History: patient has been eating other foods including milk, eggs, peanut, treenuts, sesame, shellfish, fish, soy, wheat, meats, fruits and vegetables.  Medication allergy: yes Latex - rash  Hymenoptera allergy: no History of recurrent infections suggestive of immunodeficency: no  Diagnostics: Skin Testing: Deferred due to recent antihistamines use.   Past Medical History: Patient Active Problem List   Diagnosis Date Noted   Rash and other nonspecific skin eruption 09/19/2022   Other adverse food reactions, not elsewhere classified, subsequent encounter 09/19/2022   Allergic reaction 09/19/2022   Chronic rhinitis 09/19/2022   Multiple drug allergies 09/19/2022   COVID-19 virus infection 12/01/2021   Healthcare maintenance 10/15/2020   Former smoker 10/15/2020   Allergic rhinitis 10/15/2020   Chronic cough 09/21/2020   COPD with chronic bronchitis and emphysema ([place]) 08/12/2020   Past Medical History:  Diagnosis Date   Allergic    Asthma    Osteopenia    Past Surgical History: History reviewed. No pertinent surgical history. Medication List:  Current Outpatient Medications  Medication Sig Dispense Refill   albuterol (VENTOLIN HFA) 108 (90 Base) MCG/ACT inhaler Inhale 1 puff into the lungs every 6 (six) hours as needed for wheezing or shortness of breath.     ANORO ELLIPTA 62.5-25 MCG/ACT AEPB Inhale 1 puff into the lungs daily.     calcium carbonate (TUMS - DOSED IN MG ELEMENTAL CALCIUM) 500 MG chewable tablet Chew 1 tablet  by mouth daily.     dextromethorphan-guaiFENesin (MUCINEX DM) 30-600 MG 12hr tablet Take 1 tablet by mouth 2 (two) times daily.     famotidine (PEPCID) 20 MG tablet Take 1 tablet (20 mg total) by mouth 2 (two) times daily. 60 tablet 2   fluticasone (FLOVENT HFA) 110 MCG/ACT inhaler Inhale 2 puffs into the lungs 2 (two) times daily. 1 each 12   latanoprost (XALATAN) 0.005 % ophthalmic solution 1 drop at bedtime.     magnesium oxide (MAG-OX) 400 MG tablet Take 400 mg by mouth daily.     montelukast (SINGULAIR) 10 MG tablet Take 1 tablet (10 mg total) by mouth at bedtime. 30 tablet 11   triamcinolone ointment (KENALOG) 0.1 % Apply 1 Application topically 2 (two) times daily as needed (rash flare). Do not use on the face, neck, armpits or groin area. Do not use more than 3 weeks in a row. 30 g 1   Umeclidinium-Vilanterol (ANORO ELLIPTA IN) Inhale into the lungs once.     No current facility-administered medications for this visit.   Allergies: Allergies  Allergen Reactions   Aspartame And Phenylalanine Hives   Blue Dyes (Parenteral)    Chocolate Diarrhea   Gelatin Itching    Any gel caps   Latex    Other Diarrhea    paprika   Red Dye    Adhesive [Tape] Rash   Sudafed [Pseudoephedrine Hcl] Anxiety    Makes her "drunk"   Social History: Social History   Socioeconomic History   Marital status: Divorced    Spouse name: Not on file   Number of children: Not on file   Years of education: Not on file   Highest education level: Not on file  Occupational History   Not on file  Tobacco Use   Smoking status: Former    Packs/day: 2.00    Years: 42.00    Total pack years: 84.00    Types: Cigarettes  Start date: 12/18/1954    Quit date: 07/12/2002    Years since quitting: 20.2   Smokeless tobacco: Never   Tobacco comments:    Quit smoking for about 5 years - 1974-1979  Substance and Sexual Activity   Alcohol use: No   Drug use: No   Sexual activity: Not on file  Other Topics  Concern   Not on file  Social History Narrative   Not on file   Social Determinants of Health   Financial Resource Strain: Not on file  Food Insecurity: Not on file  Transportation Needs: Not on file  Physical Activity: Not on file  Stress: Not on file  Social Connections: Not on file   Lives in a house. Smoking: quit 17 years ago Occupation: not employed  Environmental HistorySurveyor, minerals in the house: no Engineer, civil (consulting) in the family room: yes Carpet in the bedroom: yes Heating: electric Cooling: central Pet: no  Family History: Family History  Problem Relation Age of Onset   Asthma Father    Review of Systems  Constitutional:  Negative for appetite change, chills, fever and unexpected weight change.  HENT:  Negative for congestion and rhinorrhea.   Eyes:  Negative for itching.  Respiratory:  Negative for cough, chest tightness, shortness of breath and wheezing.   Cardiovascular:  Negative for chest pain.  Gastrointestinal:  Negative for abdominal pain.  Genitourinary:  Negative for difficulty urinating.  Skin:  Positive for rash.  Neurological:  Negative for headaches.    Objective: BP 130/82   Pulse 84   Temp 97.8 F (36.6 C)   Resp 16   Wt 124 lb (56.2 kg)   SpO2 95%   BMI 20.32 kg/m  Body mass index is 20.32 kg/m. Physical Exam Vitals and nursing note reviewed.  Constitutional:      Appearance: Normal appearance. She is well-developed.  HENT:     Head: Normocephalic and atraumatic.     Right Ear: Tympanic membrane and external ear normal.     Left Ear: Tympanic membrane and external ear normal.     Nose: Nose normal.     Mouth/Throat:     Mouth: Mucous membranes are moist.     Pharynx: Oropharynx is clear.  Eyes:     Conjunctiva/sclera: Conjunctivae normal.  Cardiovascular:     Rate and Rhythm: Normal rate and regular rhythm.     Heart sounds: Normal heart sounds. No murmur heard.    No friction rub. No gallop.  Pulmonary:     Effort:  Pulmonary effort is normal.     Breath sounds: Normal breath sounds. No wheezing, rhonchi or rales.  Musculoskeletal:     Cervical back: Neck supple.  Skin:    General: Skin is warm and dry.     Findings: Rash present.     Comments: Very dry, frail skin throughout. Scattered areas of excoriation marks on upper extremities. One erythematous area on the upper back noted.   Neurological:     Mental Status: She is alert and oriented to person, place, and time.  Psychiatric:        Behavior: Behavior normal.   The plan was reviewed with the patient/family, and all questions/concerned were addressed.  It was my pleasure to see Zuleima today and participate in her care. Please feel free to contact me with any questions or concerns.  Sincerely,  Wyline Mood, DO Allergy & Immunology  [facility] office: [phone] [facility] office: [phone]

## 2022-09-19 ENCOUNTER — Other Ambulatory Visit: Payer: Self-pay

## 2022-09-19 ENCOUNTER — Ambulatory Visit: Payer: Medicare Other | Admitting: Allergy

## 2022-09-19 ENCOUNTER — Encounter: Payer: Self-pay | Admitting: Allergy

## 2022-09-19 VITALS — BP 130/82 | HR 84 | Temp 97.8°F | Resp 16 | Wt 124.0 lb

## 2022-09-19 DIAGNOSIS — R21 Rash and other nonspecific skin eruption: Secondary | ICD-10-CM | POA: Diagnosis not present

## 2022-09-19 DIAGNOSIS — Z889 Allergy status to unspecified drugs, medicaments and biological substances status: Secondary | ICD-10-CM

## 2022-09-19 DIAGNOSIS — J310 Chronic rhinitis: Secondary | ICD-10-CM | POA: Diagnosis not present

## 2022-09-19 DIAGNOSIS — T7840XA Allergy, unspecified, initial encounter: Secondary | ICD-10-CM | POA: Insufficient documentation

## 2022-09-19 DIAGNOSIS — T7840XD Allergy, unspecified, subsequent encounter: Secondary | ICD-10-CM

## 2022-09-19 DIAGNOSIS — T781XXD Other adverse food reactions, not elsewhere classified, subsequent encounter: Secondary | ICD-10-CM

## 2022-09-19 MED ORDER — FAMOTIDINE 20 MG PO TABS: 20.0000 mg | ORAL_TABLET | Freq: Two times a day (BID) | ORAL | 2 refills | Status: DC

## 2022-09-19 MED ORDER — TRIAMCINOLONE ACETONIDE 0.1 % EX OINT: 1.0000 | TOPICAL_OINTMENT | Freq: Two times a day (BID) | CUTANEOUS | 1 refills | Status: AC | PRN

## 2022-09-19 NOTE — Assessment & Plan Note (Signed)
History of sinus issues and did not tolerate skin prick testing in the past.   Get bloodwork for environmental panel as per patient request.

## 2022-09-19 NOTE — Patient Instructions (Addendum)
Rash:  Not sure what's causing this rash/itching. See below for proper skin care. Take pictures of the rash flares. Continue to avoid things that give you issues - red dye, blue dye, gelatin, paprika, chocolate, latex.   Start allegra (fexofenadine) 180mg  twice a day. This replaces benadryl.  If symptoms are not controlled or causes drowsiness let us know. Start pepcid (famotidine) 20mg  twice a day.  Avoid the following potential triggers: alcohol, tight clothing, NSAIDs, hot showers and getting overheated. Get bloodwork:  We are ordering labs, so please allow 1-2 weeks for the results to come back. With the newly implemented Cures Act, the labs might be visible to you at the same time that they become visible to me. However, I will not address the results until all of the results are back, so please be patient.    Use hydrocortisone 1% cream twice a day as needed for mild rash flares - okay to use on the face, neck, groin area. Do not use more than 1 week at a time. Samples given. Use triamcinolone 0.1% ointment twice a day as needed for rash flares. Do not use on the face, neck, armpits or groin area. Do not use more than 3 weeks in a row.   Follow up in 4 weeks or sooner if needed.  Skin care recommendations  Bath time: Always use lukewarm water. AVOID very hot or cold water. Keep bathing time to 5-10 minutes. Do NOT use bubble bath. Use a mild soap and use just enough to wash the dirty areas. Do NOT scrub skin vigorously.  After bathing, pat dry your skin with a towel. Do NOT rub or scrub the skin.  Moisturizers and prescriptions:  ALWAYS apply moisturizers immediately after bathing (within 3 minutes). This helps to lock-in moisture. Use the moisturizer several times a day over the whole body. Good summer moisturizers include: Aveeno, CeraVe, Cetaphil. Good winter moisturizers include: Aquaphor, Vaseline, Cerave, Cetaphil, Eucerin, Vanicream. When using moisturizers along with  medications, the moisturizer should be applied about one hour after applying the medication to prevent diluting effect of the medication or moisturize around where you applied the medications. When not using medications, the moisturizer can be continued twice daily as maintenance.  Laundry and clothing: Avoid laundry products with added color or perfumes. Use unscented hypo-allergenic laundry products such as Tide free, Cheer free & gentle, and All free and clear.  If the skin still seems dry or sensitive, you can try double-rinsing the clothes. Avoid tight or scratchy clothing such as wool. Do not use fabric softeners or dyer sheets.

## 2022-09-19 NOTE — Assessment & Plan Note (Signed)
Recently broke out in pruritic rash 1 month ago. No triggers noted. Only systemic steroids seem to be helping. Using antihistamines with minimal benefit. Had flu shot 2 days prior and started on a new eye drop which she stopped since then. Mentions being treated for shingles 1 month prior to rash onset. History of rashes for 10+ years with various triggers - red dye, aspartame, gelatin and latex. Seen by dermatology in the past.   Discussed with patient that based on clinical history and physical exam I'm not sure what's causing her current rash.   See below for proper skin care.  Take pictures of the rash flares.  Continue to avoid things that give you issues - red dye, blue dye, gelatin, paprika, chocolate, latex.   Start allegra (fexofenadine) 180mg  twice a day. This replaces benadryl.   If symptoms are not controlled or causes drowsiness let us know.  Start Pepcid (famotidine) 20mg  twice a day.  . Avoid the following potential triggers: alcohol, tight clothing, NSAIDs, hot showers and getting overheated. . Get bloodwork to rule out other etiologies.  o If negative and rash still persistent then will refer to dermatology next.  Marland Kitchen Use hydrocortisone 1% cream twice a day as needed for mild rash flares - okay to use on the face, neck, groin area. Do not use more than 1 week at a time. o Samples given. . Use triamcinolone 0.1% ointment twice a day as needed for rash flares. Do not use on the face, neck, armpits or groin area. Do not use more than 3 weeks in a row.

## 2022-09-19 NOTE — Assessment & Plan Note (Signed)
Red dye, gelatin, aspartame causes rash/itching. Chocolate, paprika causes diarrhea. No prior evaluation. . Continue to avoid. . Get bloodwork.

## 2022-09-20 ENCOUNTER — Telehealth: Payer: Self-pay | Admitting: Allergy

## 2022-09-20 NOTE — Telephone Encounter (Signed)
Patient states that she would like Dr Maudie Mercury to call her back because she thinks she figured out what is causing her hives. Patients call back number is [phone]

## 2022-09-21 MED ORDER — DESONIDE 0.05 % EX OINT: 1.0000 | TOPICAL_OINTMENT | Freq: Two times a day (BID) | CUTANEOUS | 2 refills | Status: DC | PRN

## 2022-09-21 MED ORDER — PREDNISONE 10 MG PO TABS: 10.0000 mg | ORAL_TABLET | Freq: Every day | ORAL | 0 refills | Status: DC

## 2022-09-21 NOTE — Telephone Encounter (Signed)
Spoke with patient and she thinks she was reacting to the gelatin in the buttermilk ranch that she was eating.   Breaking out on the neck. Sent in:  Use desonide 0.05% ointment twice a day as needed for mild rash flares - okay to use on the face, neck, groin area. Do not use more than 1 week at a time.  She got her bloodwork drawn.  Itching daily and new rashes popping up. She doesn't think she can deal with it until next week.  Take prednisone 10mg  daily for 7 days.

## 2022-09-28 LAB — ALLERGENS W/TOTAL IGE AREA 2
Alternaria Alternata IgE: <0.1 kU/L
Aspergillus Fumigatus IgE: <0.1 kU/L
Bermuda Grass IgE: <0.1 kU/L
Cat Dander IgE: <0.1 kU/L
Cedar, Mountain IgE: <0.1 kU/L
Cladosporium Herbarum IgE: <0.1 kU/L
Cockroach, German IgE: <0.1 kU/L
Common Silver Birch IgE: <0.1 kU/L
Cottonwood IgE: <0.1 kU/L
D Farinae IgE: 0.84 kU/L — AB
D Pteronyssinus IgE: 0.19 kU/L — AB
Dog Dander IgE: <0.1 kU/L
Elm, American IgE: <0.1 kU/L
Johnson Grass IgE: <0.1 kU/L
Maple/Box Elder IgE: <0.1 kU/L
Mouse Urine IgE: <0.1 kU/L
Oak, White IgE: <0.1 kU/L
Pecan, Hickory IgE: <0.1 kU/L
Penicillium Chrysogen IgE: <0.1 kU/L
Pigweed, Rough IgE: <0.1 kU/L
Ragweed, Short IgE: <0.1 kU/L
Sheep Sorrel IgE Qn: <0.1 kU/L
Timothy Grass IgE: <0.1 kU/L
White Mulberry IgE: <0.1 kU/L

## 2022-09-28 LAB — PROTEIN ELECTROPHORESIS, SERUM
A/G Ratio: 1.3 (ref 0.7–1.7)
Albumin ELP: 3.7 g/dL (ref 2.9–4.4)
Alpha 1: 0.3 g/dL (ref 0.0–0.4)
Alpha 2: 0.7 g/dL (ref 0.4–1.0)
Beta: 1 g/dL (ref 0.7–1.3)
Gamma Globulin: 0.8 g/dL (ref 0.4–1.8)
Globulin, Total: 2.9 g/dL (ref 2.2–3.9)

## 2022-09-28 LAB — CBC WITH DIFFERENTIAL/PLATELET
Basophils Absolute: 0.1 10*3/uL (ref 0.0–0.2)
Basos: 1 %
EOS (ABSOLUTE): 0.3 10*3/uL (ref 0.0–0.4)
Eos: 3 %
Hematocrit: 40.5 % (ref 34.0–46.6)
Hemoglobin: 13.3 g/dL (ref 11.1–15.9)
Immature Grans (Abs): 0 10*3/uL (ref 0.0–0.1)
Immature Granulocytes: 0 %
Lymphocytes Absolute: 2.5 10*3/uL (ref 0.7–3.1)
Lymphs: 30 %
MCH: 30.2 pg (ref 26.6–33.0)
MCHC: 32.8 g/dL (ref 31.5–35.7)
MCV: 92 fL (ref 79–97)
Monocytes Absolute: 0.7 10*3/uL (ref 0.1–0.9)
Monocytes: 8 %
Neutrophils Absolute: 5 10*3/uL (ref 1.4–7.0)
Neutrophils: 58 %
Platelets: 157 10*3/uL (ref 150–450)
RBC: 4.4 x10E6/uL (ref 3.77–5.28)
RDW: 13.8 % (ref 11.7–15.4)
WBC: 8.5 10*3/uL (ref 3.4–10.8)

## 2022-09-28 LAB — PROTEIN ELECTROPHORESIS, URINE REFLEX
Albumin ELP, Urine: 100 %
Alpha-1-Globulin, U: 0 %
Alpha-2-Globulin, U: 0 %
Beta Globulin, U: 0 %
Gamma Globulin, U: 0 %
Protein, Ur: 5.1 mg/dL

## 2022-09-28 LAB — COMPREHENSIVE METABOLIC PANEL
ALT: 14 IU/L (ref 0–32)
AST: 16 IU/L (ref 0–40)
Albumin/Globulin Ratio: 1.6 (ref 1.2–2.2)
Albumin: 4.1 g/dL (ref 3.7–4.7)
Alkaline Phosphatase: 63 IU/L (ref 44–121)
BUN/Creatinine Ratio: 20 (ref 12–28)
BUN: 19 mg/dL (ref 8–27)
Bilirubin Total: <0.2 mg/dL (ref 0.0–1.2)
CO2: 24 mmol/L (ref 20–29)
Calcium: 9.2 mg/dL (ref 8.7–10.3)
Chloride: 103 mmol/L (ref 96–106)
Creatinine, Ser: 0.96 mg/dL (ref 0.57–1.00)
Globulin, Total: 2.5 g/dL (ref 1.5–4.5)
Glucose: 101 mg/dL — ABNORMAL HIGH (ref 70–99)
Potassium: 4 mmol/L (ref 3.5–5.2)
Sodium: 142 mmol/L (ref 134–144)
Total Protein: 6.6 g/dL (ref 6.0–8.5)
eGFR: 59 mL/min/{1.73_m2} — ABNORMAL LOW (ref >59)

## 2022-09-28 LAB — C3 AND C4
Complement C3, Serum: 110 mg/dL (ref 82–167)
Complement C4, Serum: 24 mg/dL (ref 12–38)

## 2022-09-28 LAB — LATEX, IGE: Latex IgE: <0.1 kU/L

## 2022-09-28 LAB — ALPHA-GAL PANEL
Allergen Lamb IgE: <0.1 kU/L
Beef IgE: <0.1 kU/L
IgE (Immunoglobulin E), Serum: 97 IU/mL (ref 6–495)
O215-IgE Alpha-Gal: <0.1 kU/L
Pork IgE: <0.1 kU/L

## 2022-09-28 LAB — THYROID CASCADE PROFILE: TSH: 1.23 u[IU]/mL (ref 0.450–4.500)

## 2022-09-28 LAB — C-REACTIVE PROTEIN: CRP: 3 mg/L (ref 0–10)

## 2022-09-28 LAB — TRYPTASE: Tryptase: 7.5 ug/L (ref 2.2–13.2)

## 2022-09-28 LAB — ALLERGEN, ANNATTO SEED
Annatto Seed IgE*: <0.35 kU/L (ref <0.35)
Class Interpretation: 0

## 2022-09-28 LAB — SEDIMENTATION RATE: Sed Rate: 18 mm/hr (ref 0–40)

## 2022-09-28 LAB — ALLERGEN CHOCOLATE: Chocolate/Cacao IgE: <0.1 kU/L

## 2022-09-28 LAB — ALLERGEN,GRN PEPPER,PAPRIKA,F218: Paprika IgE: <0.1 kU/L

## 2022-09-28 LAB — ALLERGEN, RED (CARMINE) DYE, RF340: F340-IgE Carmine Red Dye: <0.1 kU/L

## 2022-09-28 LAB — ANA W/REFLEX: Anti Nuclear Antibody (ANA): NEGATIVE

## 2022-09-28 LAB — BASO ACTIVATION TEST (FMLP): Baso Activation Test (fMLP)*: 50 ng/mL (ref >3.7)

## 2022-09-28 LAB — ALLERGEN, QUINOLINE YELLOW IGE: Quinoline Yellow - BasoF. HRT: 0.5 ng/mL (ref <1.1)

## 2022-09-28 LAB — C074-IGE GELATIN: C074-IgE Gelatin: <0.1 kU/L

## 2022-09-28 LAB — CHRONIC URTICARIA: cu index: 2.6 (ref <10)

## 2022-09-28 NOTE — Telephone Encounter (Signed)
Called and spoke with the patient and advised that sometimes it can take 1-2 weeks for lab results to come back and that Dr. Maudie Mercury would be back in office on Tuesday and if they were back she would be able to review them and we could call her to go over the results. Patient verbalized understanding. She stated that she got a notification from [facility] that the results are ready for review today. She states that she will review them and call with any questions as well. She states to please call her in the evening next week to review labs as she won't be able to take phone calls during the day. I advised that patient that if she needed anything until then to please let us know. Patient verbalized understanding.

## 2022-09-28 NOTE — Telephone Encounter (Signed)
Patient called today to see if her labs were back. I see they are not yet. She is going out of town next week and would like to know what she is allergic to so she doesn't do something that might make them worse. She wants Dr. Maudie Mercury to call [facility] and ask why it is taking so long to get the results back. She was told by the [place] that it would only take 2 days to get them back. Patient is very unhappy and told me she may switch to another practice because she is not happy with our slow response to her.

## 2022-09-28 NOTE — Telephone Encounter (Signed)
Currently having Virginia [name] checking on the status of her labs.

## 2022-10-03 NOTE — Telephone Encounter (Signed)
Noted. See result notes regarding lab results.

## 2022-10-03 NOTE — Progress Notes (Signed)
Please call patient.  I reviewed the bloodwork. Blood count, kidney function, liver function, electrolytes, thyroid, autoimmune screener, inflammation markers, chronic urticaria index (checks for autoantibodies that trigger mast cells), tryptase (checks for mast cell issues), urine test, alpha gal (checks for red meat allergy), were all normal which is great.   Negative to yellow dye, red dye, latex, gelatin, chocolate, paprika,   Environmental panel was only positive to dust mites.   Based on these results, there is no identifiable trigger for her rash.  Continue to avoid things that bothers her even though bloodwork was negative - red dye, blue dye, gelatin, paprika, chocolate, latex.  Continue allegra (fexofenadine) 180mg  twice a day. Continue Pepcid (famotidine) 20mg  twice a day.  Did the allegra and pepcid help?  Recommend referral to dermatology next for possible skin biopsy of the rash.

## 2022-10-16 ENCOUNTER — Ambulatory Visit: Payer: Medicare Other | Admitting: Nurse Practitioner

## 2022-10-16 ENCOUNTER — Encounter: Payer: Self-pay | Admitting: Nurse Practitioner

## 2022-10-16 ENCOUNTER — Ambulatory Visit (INDEPENDENT_AMBULATORY_CARE_PROVIDER_SITE_OTHER): Payer: Medicare Other

## 2022-10-16 VITALS — BP 124/72 | HR 82 | Ht 65.25 in | Wt 124.2 lb

## 2022-10-16 DIAGNOSIS — J441 Chronic obstructive pulmonary disease with (acute) exacerbation: Secondary | ICD-10-CM

## 2022-10-16 DIAGNOSIS — J45901 Unspecified asthma with (acute) exacerbation: Secondary | ICD-10-CM

## 2022-10-16 DIAGNOSIS — R053 Chronic cough: Secondary | ICD-10-CM | POA: Diagnosis not present

## 2022-10-16 DIAGNOSIS — R002 Palpitations: Secondary | ICD-10-CM

## 2022-10-16 LAB — POCT EXHALED NITRIC OXIDE: FeNO level (ppb): 43

## 2022-10-16 MED ORDER — PREDNISONE 10 MG PO TABS: ORAL_TABLET | ORAL | 0 refills | Status: DC

## 2022-10-16 MED ORDER — AZITHROMYCIN 250 MG PO TABS: ORAL_TABLET | ORAL | 0 refills | Status: DC

## 2022-10-16 NOTE — Assessment & Plan Note (Addendum)
Ongoing since December 2022. No acute worsening. She did have COVID during this time so possibly related to this? She did have a normal EKG since and labs have been normal. Seems to correlate with DOE at times. Possibly related to COPD but given persistent of symptoms, recommend she undergo further evaluation with cardiology. ED precautions provided.

## 2022-10-16 NOTE — Assessment & Plan Note (Signed)
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 Suspect a component of her cough is related to upper airway irritation given her associated hoarseness and postnasal drip. We will treat her with prednisone taper and empiric z pack. CXR to rule out superimposed infection. Encouraged to continue triple therapy regimen with Anoro and Flovent and PRN SABA. Target cough control measures and postnasal drip.  Patient Instructions  Continue Albuterol inhaler 2 puffs every 6 hours as needed for shortness of breath or wheezing. Notify if symptoms persist despite rescue inhaler/neb use.  Continue Anoro 1 puff daily Continue flovent 2 puffs Twice daily. Brush tongue and rinse mouth afterwards Continue montelukast (singulair) 1 tab At bedtime   Prednisone taper. 4 tabs for 2 days, then 3 tabs for 2 days, 2 tabs for 2 days, then 1 tab for 2 days, then stop. Take in AM with food Azithromycin 2 tabs on day one then 1 tab daily for four additional days  Flonase nasal spray 2 sprays each nostril daily for nasal congestion/drainage Delsym 2 tsp Twice daily  Benzonatate 1 capsule Three times a day for cough  Upper airway cough syndrome: Suppress your cough to allow your larynx (voice box) to heal.  Limit talking for the next few days. Avoid throat clearing. Work on cough suppression with the above recommended suppressants.  Use sugar free hard candies or non-menthol cough drops during this time to soothe your throat.  Warm tea with honey and lemon.   Chest x ray today  Referral to cardiology for evaluation of your increased heart rate since December 2022  Follow up in 2 weeks with Dr. Erin Fulling or Alanson Aly. If symptoms do not improve or worsen, please contact office for sooner follow up or seek emergency care.

## 2022-10-16 NOTE — Patient Instructions (Addendum)
Continue Albuterol inhaler 2 puffs every 6 hours as needed for shortness of breath or wheezing. Notify if symptoms persist despite rescue inhaler/neb use.  Continue Anoro 1 puff daily Continue flovent 2 puffs Twice daily. Brush tongue and rinse mouth afterwards Continue montelukast (singulair) 1 tab At bedtime   Prednisone taper. 4 tabs for 2 days, then 3 tabs for 2 days, 2 tabs for 2 days, then 1 tab for 2 days, then stop. Take in AM with food Azithromycin 2 tabs on day one then 1 tab daily for four additional days  Flonase nasal spray 2 sprays each nostril daily for nasal congestion/drainage Delsym 2 tsp Twice daily  Benzonatate 1 capsule Three times a day for cough  Upper airway cough syndrome: Suppress your cough to allow your larynx (voice box) to heal.  Limit talking for the next few days. Avoid throat clearing. Work on cough suppression with the above recommended suppressants.  Use sugar free hard candies or non-menthol cough drops during this time to soothe your throat.  Warm tea with honey and lemon.   Chest x ray today  Referral to cardiology for evaluation of your increased heart rate since December 2022  Follow up in 2 weeks with Virginia [name] or Virginia [name]. If symptoms do not improve or worsen, please contact office for sooner follow up or seek emergency care.

## 2022-10-16 NOTE — Progress Notes (Signed)
@Patient  ID: Virginia [name], female    DOB: 1941-03-27, 81 y.o.   MRN: [id number]  Chief Complaint  Patient presents with   Follow-up    Pt f/u she is having increased SOB, coughing, increased fatigue. She is negative for COVID denies fever. Allergy testing revealed dust mites.      Referring provider: Jilda Panda, MD  HPI: 81 year old female, former smoker (32 pack years) followed for COPD, allergic rhinitis and chronic cough.  She is a patient of Dr. August Albino and was last seen in office on 06/05/2022 by Parrett NP.  Past medical history significant for hypertension and osteopenia.  TEST/EVENTS:  10/15/2020 PFTs: FVC 2 (74), FEV1 0.81 (40), ratio 41, DLCO uncorrected 6.58 (33).  Unable to complete lung volumes.  Severe obstructive airway disease 12/08/2020 CXR 2 view: Chest is hyperexpanded, peribronchial thickening.  Compatible with COPD.  No acute change.  06/05/2022: OV with Parrett NP. Increased cough with thick mucus and SOB. Sick 3 weeks ago with bronchitis, seen by PCP and given depo and steroid taper with resolution of symptoms. 1 week ago, cough restarted. Treated for AECOPD with empiric abx and short steroid burst.   10/16/2022: Today - acute Patient presents today for acute visit.  She tells me that the beginning of this month, she went to [place] on vacation.  She went in and out of multiple different exams.  She then returned and had developed a persistent cough.  She also developed hives around 10/4, thought that this was related to the gelatin in the grams that she had.  She was treated with prednisone 10 mg for 7 days.  Did not make much of a difference in her cough; may be slight decrease.  Unfortunately, since coming off this her cough has returned.  It is dry.  She also has some voice hoarseness and throat irritation with slight increased in postnasal drip. She has some associated increased shortness of breath. She denies wheezing, fevers, chills, hemoptysis. She is using  Anoro and flovent for maintenance. She takes singulair and allegra for allergies. 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  She's also been struggling with palpitations since December. Notices that her heart rate feels very fast and almost flutters when she exerts herself. This makes her feel a little more winded. She's not sure if it's related to her breathing or her heart. She denies chest pain, orthopnea, PND, leg swelling, syncope. She talked to her PCP about this who obtained an EKG, which was normal. She's never seen a cardiologist.   FeNO 49 ppb  Allergies  Allergen Reactions   Aspartame And Phenylalanine Hives   Blue Dyes (Parenteral)    Chocolate Diarrhea   Gelatin Itching    Any gel caps   Latex    Other Diarrhea    paprika   Red Dye    Adhesive [Tape] Rash   Sudafed [Pseudoephedrine Hcl] Anxiety    Makes her "drunk"    Immunization History  Administered Date(s) Administered   Fluad Quad(high Dose 65+) 09/28/2021   Influenza-Unspecified 09/03/2020   PFIZER(Purple Top)SARS-COV-2 Vaccination 01/07/2020, 01/26/2020, 09/21/2020    Past Medical History:  Diagnosis Date   Allergic    Asthma    Osteopenia     Tobacco History: Social History   Tobacco Use  Smoking Status Former   Packs/day: 2.00   Years: 42.00   Total pack years: 84.00   Types: Cigarettes   Start date: 12/18/1954   Quit date: 07/12/2002  Years since quitting: 20.2  Smokeless Tobacco Never  Tobacco Comments   Quit smoking for about 5 years - 1974-1979   Counseling given: Not Answered Tobacco comments: Quit smoking for about 5 years - 1974-1979    Outpatient Medications Prior to Visit  Medication Sig Dispense Refill   albuterol (VENTOLIN HFA) 108 (90 Base) MCG/ACT inhaler Inhale 1 puff into the lungs every 6 (six) hours as needed for wheezing or shortness of breath.     ANORO ELLIPTA 62.5-25 MCG/ACT AEPB Inhale 1 puff into the lungs daily.     calcium carbonate  (TUMS - DOSED IN MG ELEMENTAL CALCIUM) 500 MG chewable tablet Chew 1 tablet by mouth daily.     desonide (DESOWEN) 0.05 % ointment Apply 1 Application topically 2 (two) times daily as needed (mild rash flare). Okay to use on the face, neck, groin area. Do not use more than 1 week at a time. 60 g 2   dextromethorphan-guaiFENesin (MUCINEX DM) 30-600 MG 12hr tablet Take 1 tablet by mouth once.     famotidine (PEPCID) 20 MG tablet Take 1 tablet (20 mg total) by mouth 2 (two) times daily. 60 tablet 2   fluticasone (FLOVENT HFA) 110 MCG/ACT inhaler Inhale 2 puffs into the lungs 2 (two) times daily. 1 each 12   latanoprost (XALATAN) 0.005 % ophthalmic solution 1 drop at bedtime.     magnesium oxide (MAG-OX) 400 MG tablet Take 400 mg by mouth daily.     montelukast (SINGULAIR) 10 MG tablet Take 1 tablet (10 mg total) by mouth at bedtime. 30 tablet 11   triamcinolone ointment (KENALOG) 0.1 % Apply 1 Application topically 2 (two) times daily as needed (rash flare). Do not use on the face, neck, armpits or groin area. Do not use more than 3 weeks in a row. 30 g 1   Umeclidinium-Vilanterol (ANORO ELLIPTA IN) Inhale into the lungs once.     predniSONE (DELTASONE) 10 MG tablet Take 1 tablet (10 mg total) by mouth daily with breakfast. 7 tablet 0   No facility-administered medications prior to visit.     Review of Systems:   Constitutional: No weight loss or gain, night sweats, fevers, chills, fatigue, lassitude  HEENT: No difficulty swallowing, tooth/dental problems, or sore throat. No sneezing, itching, ear ache. +nasal congestion, rhinorrhea  CV:  +palpitations. No chest pain, orthopnea, swelling in lower extremities, anasarca, dizziness, syncope Resp: +shortness of breath with exertion; non-productive cough. No excess mucus or change in color of mucus. No wheezing. No hemoptysis.  No chest wall deformity GI:  No heartburn, indigestion, abdominal pain, nausea, vomiting, diarrhea, change in bowel habits,  loss of appetite, bloody stools.  Skin: +rash (resolved). No lesions, ulcerations MSK:  No joint pain or swelling.  No decreased range of motion.  No back pain. Neuro: No dizziness or lightheadedness.  Psych: No depression or anxiety. Mood stable.     Physical Exam:  BP 124/72   Pulse 82   Ht 5' 5.25" (1.657 m)   Wt 124 lb 3.2 oz (56.3 kg)   SpO2 93%   BMI 20.51 kg/m   GEN: Pleasant, interactive, acutely-ill appearing; in no acute distress. HEENT:  Normocephalic and atraumatic. PERRLA. Sclera white. Nasal turbinates pale, moist and patent bilaterally. No rhinorrhea present. Oropharynx erythematous and moist, without exudate or edema. No lesions, ulcerations NECK:  Supple w/ fair ROM. No JVD present. Normal carotid impulses w/o bruits. Thyroid symmetrical with no goiter or nodules palpated. No lymphadenopathy.   CV: RRR,  no m/r/g, no peripheral edema. Pulses intact, +2 bilaterally. No cyanosis, pallor or clubbing. PULMONARY:  Unlabored, regular breathing. Diminished throughout P w/o wheezes/rales/rhonchi. No accessory muscle use. No dullness to percussion. GI: BS present and normoactive. Soft, non-tender to palpation. No organomegaly or masses detected.  MSK: No erythema, warmth or tenderness. No deformities or joint swelling noted.  Neuro: A/Ox3. No focal deficits noted.   Skin: Warm, no lesions or rashe Psych: Normal affect and behavior. Judgement and thought content appropriate.     Lab Results:  CBC    Component Value Date/Time   WBC 8.5 09/19/2022 1437   WBC 7.7 10/15/2020 1652   RBC 4.40 09/19/2022 1437   RBC 4.98 10/15/2020 1652   HGB 13.3 09/19/2022 1437   HCT 40.5 09/19/2022 1437   PLT 157 09/19/2022 1437   MCV 92 09/19/2022 1437   MCH 30.2 09/19/2022 1437   MCH 29.5 10/15/2020 1652   MCHC 32.8 09/19/2022 1437   MCHC 32.8 10/15/2020 1652   RDW 13.8 09/19/2022 1437   LYMPHSABS 2.5 09/19/2022 1437   EOSABS 0.3 09/19/2022 1437   BASOSABS 0.1 09/19/2022 1437     BMET    Component Value Date/Time   NA 142 09/19/2022 1437   K 4.0 09/19/2022 1437   CL 103 09/19/2022 1437   CO2 24 09/19/2022 1437   GLUCOSE 101 (H) 09/19/2022 1437   BUN 19 09/19/2022 1437   CREATININE 0.96 09/19/2022 1437   CALCIUM 9.2 09/19/2022 1437    BNP No results found for: "BNP"   Imaging:  DG Chest 2 View  Result Date: 10/16/2022 CLINICAL DATA:  Thrive persistent cough for 2 3 weeks. COPD/asthma exacerbation. EXAM: CHEST - 2 VIEW COMPARISON:  Chest two views 12/01/2021 FINDINGS: Cardiac silhouette is again moderately to markedly enlarged. Mediastinal contours are within limits. Moderate calcification within the aortic arch is unchanged. Mild chronic bilateral interstitial thickening. Flattening of the diaphragms and moderate to high-grade hyperinflation. No new focal airspace opacity. No pleural effusion pneumothorax. Moderate multilevel degenerative disc changes of the thoracic spine. Mild high-grade dextrocurvature of the thoracolumbar junction. Bilateral calcified breast implants are not significantly changed. IMPRESSION: 1. No significant change from prior. 2. Chronic hyperinflation and COPD. Electronically Signed   By: Yvonne Kendall M.D.   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 Suspect a component of her cough is related to upper airway irritation  given her associated hoarseness and postnasal drip. We will treat her with prednisone taper and empiric z pack. CXR to rule out superimposed infection. Encouraged to continue triple therapy regimen with Anoro and Flovent and PRN SABA. Target cough control measures and postnasal drip.  Patient Instructions  Continue  Albuterol inhaler 2 puffs every 6 hours as needed for shortness of breath or wheezing. Notify if symptoms persist despite rescue inhaler/neb use.  Continue Anoro 1 puff daily Continue flovent 2 puffs Twice daily. Brush tongue and rinse mouth afterwards Continue montelukast (singulair) 1 tab At bedtime   Prednisone taper. 4 tabs for 2 days, then 3 tabs for 2 days, 2 tabs for 2 days, then 1 tab for 2 days, then stop. Take in AM with food Azithromycin 2 tabs on day one then 1 tab daily for four additional days  Flonase nasal spray 2 sprays each nostril daily for nasal congestion/drainage Delsym 2 tsp Twice daily  Benzonatate 1 capsule Three times a day for cough  Upper airway cough syndrome: Suppress your cough to allow your larynx (voice box) to heal.  Limit talking for the next few days. Avoid throat clearing. Work on cough suppression with the above recommended suppressants.  Use sugar free hard candies or non-menthol cough drops during this time to soothe your throat.  Warm tea with honey and lemon.   Chest x ray today  Referral to cardiology for evaluation of your increased heart rate since December 2022  Follow up in 2 weeks with Dr. Erin Fulling or Alanson Aly. If symptoms do not improve or worsen, please contact office for sooner follow up or seek emergency care.     Palpitations Ongoing since December 2022. No acute worsening. She did have COVID during this time so possibly related to this? She did have a normal EKG since and labs have been normal. Seems to correlate with DOE at times. Possibly related to COPD but given persistent of symptoms, recommend she undergo further  evaluation with cardiology. ED precautions provided.    Clayton Bibles, NP 10/16/2022  Pt aware and understands NP's role.

## 2022-10-16 NOTE — Progress Notes (Signed)
Please notify patient no evidence of pneumonia on her x ray. Continue with our plan as discussed earlier. Thanks.

## 2022-10-20 ENCOUNTER — Telehealth: Payer: Self-pay | Admitting: Allergy

## 2022-10-20 NOTE — Telephone Encounter (Signed)
Patient called to cancel her appointment on November 7 in [facility] with Dr Maudie Mercury. Patient stated that she is not happy with this doctor and no longer wants to be seen here. I offered patient an appointment with another doctor but patient refused it.

## 2022-10-24 ENCOUNTER — Ambulatory Visit: Payer: Medicare Other | Admitting: Allergy

## 2022-10-26 NOTE — Telephone Encounter (Signed)
Spoke with patient, she stated that she was not happy with the care she received especially after driving a good distance to get there. Patient stated that when she came in for her visit she came in with hives on her neck, shoulders, and face and was miserable. Patient stated that she didn't feel like the provider listened to her. Patient stated she was prescribed some ointment that should couldn't even use as it stated not to use on face, neck, axillary, and groin. Patient stated the main areas of her hives was her face and neck. Patient stated she called to inform provider what she thought was the cause of her hives and to see about having something else called in. Patient informed me that it took 2 days to get a response regarding another ointment. Patient then informed me that she received her lab results through mychart and called to get the results however was informed that the provider hasn't reviewed them yet and wouldn't be back until the following week. Patient was upset as she is broken out with hives and needs to know what is causing them and what she can do. Patient stated she requested a call from the provider when someone called about her lab results so that the provider could go over it with her and answer any questions she may have had however she never received a call from provider. Patient stated that she will not drive to [facility] again. I informed patient that we do have a location in [place] at [address] across from [place] and there are several other providers available for her to see. She did inform me that the [facility] office was a lot closer and she would do some research as to who is the best provider for her. She appreciated a call regarding this matter and hopes to have better service if she comes back.

## 2022-11-06 ENCOUNTER — Encounter: Payer: Self-pay | Admitting: Pulmonary Disease

## 2022-11-06 ENCOUNTER — Ambulatory Visit: Payer: Medicare Other | Admitting: Pulmonary Disease

## 2022-11-06 VITALS — BP 122/70 | HR 72 | Ht 65.25 in | Wt 123.0 lb

## 2022-11-06 DIAGNOSIS — J4489 Other specified chronic obstructive pulmonary disease: Secondary | ICD-10-CM | POA: Diagnosis not present

## 2022-11-06 DIAGNOSIS — J439 Emphysema, unspecified: Secondary | ICD-10-CM | POA: Diagnosis not present

## 2022-11-06 NOTE — Patient Instructions (Addendum)
We will check on a referral to pulmonary rehab again  Continue anoro ellipta 1 puff daily  Continue flovent 2 puffs twice daily  Continue montelukast daily  Continue allegra daily  Continue flonase 1 spray per nostril daily  We will fill out a handicap placard for you today  Follow up in 6 months.

## 2022-11-06 NOTE — Progress Notes (Signed)
Synopsis: Referred in November 2022 for COPD, former patient of Dr. Vassie Loll.  Subjective:   PATIENT ID: Virginia [name] GENDER: female DOB: Jul 01, 1941, MRN: [id number]  HPI  Chief Complaint  Patient presents with   Follow-up    2-3 wk f/u for COPD exacerbation. States her cough has improved but she is still wheezing. Wants a handicap placard    Virginia [name] is an 81 year old woman, former smoker with COPD, allergic rhinitis and chronic cough who returns to pulmonary clinic for follow up.   She was seen 06/05/22 by Rubye Oaks, NP for COPD exacerbation and 10/09/22 by Rhunette Croft, NP for COPD exacerbation.   She is feeling better but continues to have cough and clearing of her throat.   She has cardiac evaluation coming up on 11/17/22.   OV 01/30/22 She continues to have some more dyspnea than prior to her covid infection. She is using Anoro 1 puff daily and flovent 2 puffs twice daily. She continues to have nose and sinus drainage which leads to cough.   She has not had the overnight oximitry test done yet.   OV 12/07/21 She was seen by Rhunette Croft, NP on 12/01/21 for acute visit. She tested postive for covid 19. She was started on prednisone taper and a course of molnupiravir. She is feeling much better.   She was started on flovent and anoro at the prior visit with improvement in her breathing. She reports she is able to walk more with her son.   OV 10/28/21 PFTs 2021 showed ratio 41%, FEV1 0.81L (40%), FVC 1.88L (69%), no significant bronchodilator response. DLCO 33%.   She has been anoro ellipta daily and as needed albuterol. She did not tolerate Breztri in the past due to hypertension.  She was treated for COPD exacerbation 02/2021 with doxycycline and prednisone.  She continues to have chronic cough due to post nasal drainage. She stopped using ipratropium nasal spray 0.06% due to mouth dryness. She is taking Allegra nightly, chlortrimeton nightly, mucinex nightly for her  cough. She is also using flonase daily and saline spray PRN.   Past Medical History:  Diagnosis Date   Allergic    Asthma    Osteopenia      Family History  Problem Relation Age of Onset   Asthma Father      Social History   Socioeconomic History   Marital status: Divorced    Spouse name: Not on file   Number of children: Not on file   Years of education: Not on file   Highest education level: Not on file  Occupational History   Not on file  Tobacco Use   Smoking status: Former    Packs/day: 2.00    Years: 42.00    Total pack years: 84.00    Types: Cigarettes    Start date: 12/18/1954    Quit date: 07/12/2002    Years since quitting: 20.3   Smokeless tobacco: Never   Tobacco comments:    Quit smoking for about 5 years - 1974-1979  Substance and Sexual Activity   Alcohol use: No   Drug use: No   Sexual activity: Not on file  Other Topics Concern   Not on file  Social History Narrative   Not on file   Social Determinants of Health   Financial Resource Strain: Not on file  Food Insecurity: Not on file  Transportation Needs: Not on file  Physical Activity: Not on file  Stress: Not on file  Social Connections: Not on file  Intimate Partner Violence: Not on file     Allergies  Allergen Reactions   Aspartame And Phenylalanine Hives   Blue Dyes (Parenteral)    Chocolate Diarrhea   Gelatin Itching    Any gel caps   Latex    Other Diarrhea    paprika   Red Dye    Adhesive [Tape] Rash   Sudafed [Pseudoephedrine Hcl] Anxiety    Makes her "drunk"     Outpatient Medications Prior to Visit  Medication Sig Dispense Refill   albuterol (VENTOLIN HFA) 108 (90 Base) MCG/ACT inhaler Inhale 1 puff into the lungs every 6 (six) hours as needed for wheezing or shortness of breath.     ANORO ELLIPTA 62.5-25 MCG/ACT AEPB Inhale 1 puff into the lungs daily.     calcium carbonate (TUMS - DOSED IN MG ELEMENTAL CALCIUM) 500 MG chewable tablet Chew 1 tablet by mouth daily.      chlorpheniramine (CHLOR-TRIMETON) 4 MG tablet Take 4 mg by mouth at bedtime.     desonide (DESOWEN) 0.05 % ointment Apply 1 Application topically 2 (two) times daily as needed (mild rash flare). Okay to use on the face, neck, groin area. Do not use more than 1 week at a time. 60 g 2   dextromethorphan-guaiFENesin (MUCINEX DM) 30-600 MG 12hr tablet Take 1 tablet by mouth once.     famotidine (PEPCID) 20 MG tablet Take 1 tablet (20 mg total) by mouth 2 (two) times daily. 60 tablet 2   fexofenadine (ALLEGRA) 180 MG tablet Take 180 mg by mouth daily.     fluticasone (FLONASE ALLERGY RELIEF) 50 MCG/ACT nasal spray Place 1 spray into both nostrils daily.     fluticasone (FLOVENT HFA) 110 MCG/ACT inhaler Inhale 2 puffs into the lungs 2 (two) times daily. 1 each 12   latanoprost (XALATAN) 0.005 % ophthalmic solution 1 drop at bedtime.     magnesium oxide (MAG-OX) 400 MG tablet Take 400 mg by mouth daily.     montelukast (SINGULAIR) 10 MG tablet Take 1 tablet (10 mg total) by mouth at bedtime. 30 tablet 11   triamcinolone ointment (KENALOG) 0.1 % Apply 1 Application topically 2 (two) times daily as needed (rash flare). Do not use on the face, neck, armpits or groin area. Do not use more than 3 weeks in a row. 30 g 1   azithromycin (ZITHROMAX) 250 MG tablet Take 2 tablets on day one then 1 tablet daily for four additional days 6 tablet 0   predniSONE (DELTASONE) 10 MG tablet 4 tabs for 2 days, then 3 tabs for 2 days, 2 tabs for 2 days, then 1 tab for 2 days, then stop 20 tablet 0   Umeclidinium-Vilanterol (ANORO ELLIPTA IN) Inhale into the lungs once.     No facility-administered medications prior to visit.   Review of Systems  Constitutional:  Negative for chills, fever, malaise/fatigue and weight loss.  HENT:  Positive for congestion. Negative for sinus pain and sore throat.   Eyes: Negative.   Respiratory:  Positive for cough and shortness of breath. Negative for hemoptysis, sputum production and  wheezing.   Cardiovascular:  Negative for chest pain, palpitations, orthopnea, claudication and leg swelling.  Gastrointestinal:  Negative for abdominal pain, heartburn, nausea and vomiting.  Genitourinary: Negative.   Musculoskeletal:  Negative for joint pain and myalgias.  Skin:  Negative for rash.  Neurological:  Negative for weakness.  Endo/Heme/Allergies: Negative.   Psychiatric/Behavioral: Negative.  Objective:   Vitals:   11/06/22 1521  BP: 122/70  Pulse: 72  SpO2: 95%  Weight: 123 lb (55.8 kg)  Height: 5' 5.25" (1.657 m)   Physical Exam Constitutional:      General: She is not in acute distress.    Appearance: She is not ill-appearing.     Comments: thin  HENT:     Head: Normocephalic and atraumatic.  Eyes:     General: No scleral icterus.    Conjunctiva/sclera: Conjunctivae normal.  Cardiovascular:     Rate and Rhythm: Normal rate and regular rhythm.     Pulses: Normal pulses.     Heart sounds: Normal heart sounds. No murmur heard. Pulmonary:     Effort: Pulmonary effort is normal.     Breath sounds: Decreased breath sounds present. No wheezing, rhonchi or rales.  Musculoskeletal:     Right lower leg: No edema.     Left lower leg: No edema.  Skin:    General: Skin is warm and dry.  Neurological:     General: No focal deficit present.     Mental Status: She is alert.  Psychiatric:        Mood and Affect: Mood normal.        Behavior: Behavior normal.        Thought Content: Thought content normal.        Judgment: Judgment normal.    CBC    Component Value Date/Time   WBC 8.5 09/19/2022 1437   WBC 7.7 10/15/2020 1652   RBC 4.40 09/19/2022 1437   RBC 4.98 10/15/2020 1652   HGB 13.3 09/19/2022 1437   HCT 40.5 09/19/2022 1437   PLT 157 09/19/2022 1437   MCV 92 09/19/2022 1437   MCH 30.2 09/19/2022 1437   MCH 29.5 10/15/2020 1652   MCHC 32.8 09/19/2022 1437   MCHC 32.8 10/15/2020 1652   RDW 13.8 09/19/2022 1437   LYMPHSABS 2.5 09/19/2022  1437   EOSABS 0.3 09/19/2022 1437   BASOSABS 0.1 09/19/2022 1437      Latest Ref Rng & Units 09/19/2022    2:37 PM  BMP  Glucose 70 - 99 mg/dL 101   BUN 8 - 27 mg/dL 19   Creatinine 0.57 - 1.00 mg/dL 0.96   BUN/Creat Ratio 12 - 28 20   Sodium 134 - 144 mmol/L 142   Potassium 3.5 - 5.2 mmol/L 4.0   Chloride 96 - 106 mmol/L 103   CO2 20 - 29 mmol/L 24   Calcium 8.7 - 10.3 mg/dL 9.2    Chest imaging: CXR 12/08/20 The chest is hyperexpanded and there is peribronchial thickening, unchanged. No consolidative process, pneumothorax or effusion. Heart size is normal. Marked convex right thoracolumbar scoliosis again seen. Calcified breast implants also again noted.  PFT:    Latest Ref Rng & Units 10/15/2020    2:53 PM  PFT Results  FVC-Pre L 1.88   FVC-Predicted Pre % 69   FVC-Post L 2.00   FVC-Predicted Post % 74   Pre FEV1/FVC % % 40   Post FEV1/FCV % % 41   FEV1-Pre L 0.76   FEV1-Predicted Pre % 37   FEV1-Post L 0.81   DLCO uncorrected ml/min/mmHg 6.58   DLCO UNC% % 33   DLCO corrected ml/min/mmHg 6.58   DLCO COR %Predicted % 33   DLVA Predicted % 52    Labs: 10/15/20: Alpha 1 184, IgE 55, peripheral eosinophils 177  Path:  Echo:  Heart Catheterization:  Assessment &  Plan:   COPD with chronic bronchitis and emphysema ([place]) - Plan: AMB referral to pulmonary rehabilitation  Discussion: Virginia [name] is an 81 year old woman, former smoker with COPD, allergic rhinitis and chronic cough who returns to pulmonary clinic for follow up.   She has severe obstructive defect and severe diffusion defect based on PFTs from 2021. She continues to have ongoing dyspnea with exertion. She is to continue anoro ellipta 1 puff daily and flovent 16mcg 2 puffs twice daily. She can continue to use albuterol inhaler as needed. She is to continue montelukast daily. Continue flonase daily.   We will complete a handicap placard for her today.  She has recovered well from her recent  COPD exacerbation.   We will refer to pulmonary rehab.   Follow up in 6 months.  Freda Jackson, MD [facility] Office: [phone]   Current Outpatient Medications:    albuterol (VENTOLIN HFA) 108 (90 Base) MCG/ACT inhaler, Inhale 1 puff into the lungs every 6 (six) hours as needed for wheezing or shortness of breath., Disp: , Rfl:    ANORO ELLIPTA 62.5-25 MCG/ACT AEPB, Inhale 1 puff into the lungs daily., Disp: , Rfl:    calcium carbonate (TUMS - DOSED IN MG ELEMENTAL CALCIUM) 500 MG chewable tablet, Chew 1 tablet by mouth daily., Disp: , Rfl:    chlorpheniramine (CHLOR-TRIMETON) 4 MG tablet, Take 4 mg by mouth at bedtime., Disp: , Rfl:    desonide (DESOWEN) 0.05 % ointment, Apply 1 Application topically 2 (two) times daily as needed (mild rash flare). Okay to use on the face, neck, groin area. Do not use more than 1 week at a time., Disp: 60 g, Rfl: 2   dextromethorphan-guaiFENesin (MUCINEX DM) 30-600 MG 12hr tablet, Take 1 tablet by mouth once., Disp: , Rfl:    famotidine (PEPCID) 20 MG tablet, Take 1 tablet (20 mg total) by mouth 2 (two) times daily., Disp: 60 tablet, Rfl: 2   fexofenadine (ALLEGRA) 180 MG tablet, Take 180 mg by mouth daily., Disp: , Rfl:    fluticasone (FLONASE ALLERGY RELIEF) 50 MCG/ACT nasal spray, Place 1 spray into both nostrils daily., Disp: , Rfl:    fluticasone (FLOVENT HFA) 110 MCG/ACT inhaler, Inhale 2 puffs into the lungs 2 (two) times daily., Disp: 1 each, Rfl: 12   latanoprost (XALATAN) 0.005 % ophthalmic solution, 1 drop at bedtime., Disp: , Rfl:    magnesium oxide (MAG-OX) 400 MG tablet, Take 400 mg by mouth daily., Disp: , Rfl:    montelukast (SINGULAIR) 10 MG tablet, Take 1 tablet (10 mg total) by mouth at bedtime., Disp: 30 tablet, Rfl: 11   triamcinolone ointment (KENALOG) 0.1 %, Apply 1 Application topically 2 (two) times daily as needed (rash flare). Do not use on the face, neck, armpits or groin area. Do not use more than 3  weeks in a row., Disp: 30 g, Rfl: 1

## 2022-11-16 NOTE — Progress Notes (Signed)
Cardiology Office Note:    Date:  11/17/2022   ID:  Virginia [name], DOB May 17, 1941, MRN [id number]  PCP:  Ralene Ok, MD   [facility] Providers Cardiologist:  Christell Constant, MD     Referring MD: Noemi Chapel, NP   CC: Elevated Heart rates and Asthma Consulted for the evaluation of palpitations at the behest of Ms. Cobb  History of Present Illness:    Virginia [name] is a 81 y.o. female with a hx of COPD, former smoker, with palpitations.  Patient notes that she is feeling SOB that is related to her obstructive lung disease.   She feels heart rate race; this is worsen with her heart rates going fast with DOE; and with   Has had no chest pain, chest pressure, chest tightness, chest stinging . Prior to the pandemic was able to play golf with no issues. Last year had COVID-19 and had not felt the same since.  No change in her SOB since post COVID-19. Red and blue dyes- rash  Past Medical History:  Diagnosis Date   Allergic    Asthma    COPD (chronic obstructive pulmonary disease) (HCC)    Osteopenia    Palpitations     No past surgical history on file.  Current Medications: Current Meds  Medication Sig   albuterol (VENTOLIN HFA) 108 (90 Base) MCG/ACT inhaler Inhale 1 puff into the lungs every 6 (six) hours as needed for wheezing or shortness of breath.   ANORO ELLIPTA 62.5-25 MCG/ACT AEPB Inhale 1 puff into the lungs daily.   calcium carbonate (TUMS - DOSED IN MG ELEMENTAL CALCIUM) 500 MG chewable tablet Chew 1 tablet by mouth daily.   chlorpheniramine (CHLOR-TRIMETON) 4 MG tablet Take 4 mg by mouth at bedtime.   desonide (DESOWEN) 0.05 % ointment Apply 1 Application topically 2 (two) times daily as needed (mild rash flare). Okay to use on the face, neck, groin area. Do not use more than 1 week at a time.   dextromethorphan-guaiFENesin (MUCINEX DM) 30-600 MG 12hr tablet Take 1 tablet by mouth daily at 6 (six) AM.   fexofenadine (ALLEGRA) 180  MG tablet Take 180 mg by mouth daily.   fluticasone (FLONASE ALLERGY RELIEF) 50 MCG/ACT nasal spray Place 1 spray into both nostrils as needed for allergies.   fluticasone (FLOVENT HFA) 110 MCG/ACT inhaler Inhale 2 puffs into the lungs 2 (two) times daily.   latanoprost (XALATAN) 0.005 % ophthalmic solution 1 drop at bedtime.   magnesium oxide (MAG-OX) 400 MG tablet Take 400 mg by mouth daily.   montelukast (SINGULAIR) 10 MG tablet Take 10 mg by mouth at bedtime.   triamcinolone ointment (KENALOG) 0.1 % Apply 1 Application topically 2 (two) times daily as needed (rash flare). Do not use on the face, neck, armpits or groin area. Do not use more than 3 weeks in a row.     Allergies:   Aspartame and phenylalanine, Blue dyes (parenteral), Chocolate, Gelatin, Latex, Other, Red dye, Adhesive [tape], and Sudafed [pseudoephedrine hcl]   Social History   Socioeconomic History   Marital status: Divorced    Spouse name: Not on file   Number of children: Not on file   Years of education: Not on file   Highest education level: Not on file  Occupational History   Not on file  Tobacco Use   Smoking status: Former    Packs/day: 2.00    Years: 42.00    Total pack years: 84.00  Types: Cigarettes    Start date: 12/18/1954    Quit date: 07/12/2002    Years since quitting: 20.3   Smokeless tobacco: Never   Tobacco comments:    Quit smoking for about 5 years - 1974-1979  Substance and Sexual Activity   Alcohol use: No   Drug use: No   Sexual activity: Not on file  Other Topics Concern   Not on file  Social History Narrative   Not on file   Social Determinants of Health   Financial Resource Strain: Not on file  Food Insecurity: Not on file  Transportation Needs: Not on file  Physical Activity: Not on file  Stress: Not on file  Social Connections: Not on file    Family History: The patient's family history includes Asthma in her father.  ROS:   Please see the history of present illness.      All other systems reviewed and are negative.  EKGs/Labs/Other Studies Reviewed:    The following studies were reviewed today:  EKG:  EKG is  ordered today.  The ekg ordered today demonstrates  11/27/22: Sinus tachycardia with Rare PACs and PVCs     Recent Labs: 09/19/2022: ALT 14; BUN 19; Creatinine, Ser 0.96; Hemoglobin 13.3; Platelets 157; Potassium 4.0; Sodium 142; TSH 1.230  Recent Lipid Panel No results found for: "CHOL", "TRIG", "HDL", "CHOLHDL", "VLDL", "LDLCALC", "LDLDIRECT"           Physical Exam:    VS:  BP 128/68   Pulse (!) 108   Ht 5\' 5"  (1.651 m)   Wt 125 lb (56.7 kg)   SpO2 93%   BMI 20.80 kg/m     Wt Readings from Last 3 Encounters:  11/17/22 125 lb (56.7 kg)  11/06/22 123 lb (55.8 kg)  10/16/22 124 lb 3.2 oz (56.3 kg)    GEN:  Well nourished, well developed in no acute distress HEENT: Normal NECK: No JVD; No carotid bruits LYMPHATICS: No lymphadenopathy CARDIAC: RRR, no murmurs, rubs, gallops RESPIRATORY:  Clear to auscultation without rales, wheezing or rhonchi  ABDOMEN: Soft, non-tender, non-distended MUSCULOSKELETAL:  No edema; scoliosis noted SKIN: Warm and dry NEUROLOGIC:  Alert and oriented x 3 PSYCHIATRIC:  Normal affect   ASSESSMENT:    1. Palpitations   2. PAC (premature atrial contraction)   3. PVC (premature ventricular contraction)    PLAN:    Palpitations; possible SVT PACs/PVCs COPD Red and blue dye allergy Scoliosis - I suspect she had sinus tachycardia related to obstructive lung disease and scoliosis; needs treatment of the primary disease but not necessarily the rates; would do well with pulmonary rehab - AV Nodal Therapy: I have concerns about starting CCB; would not do BB due to obstructive lung disease - will get ziopatch - if significant PVCs or symptomatic will get echo or start CCB   Six months me or APP        Medication Adjustments/Labs and Tests Ordered: Current medicines are reviewed at length  with the patient today.  Concerns regarding medicines are outlined above.  Orders Placed This Encounter  Procedures   LONG TERM MONITOR (3-14 DAYS)   EKG 12-Lead   No orders of the defined types were placed in this encounter.   Patient Instructions  Medication Instructions:  Your physician recommends that you continue on your current medications as directed. Please refer to the Current Medication list given to you today.  *If you need a refill on your cardiac medications before your next appointment, please call  your pharmacy*   Lab Work: NONE If you have labs (blood work) drawn today and your tests are completely normal, you will receive your results only by: MyChart Message (if you have MyChart) OR A paper copy in the mail If you have any lab test that is abnormal or we need to change your treatment, we will call you to review the results.   Testing/Procedures: Your physician has requested that you wear a heart monitor.    Follow-Up: At [facility], you and your health needs are our priority.  As part of our continuing mission to provide you with exceptional heart care, we have created designated Provider Care Teams.  These Care Teams include your primary Cardiologist (physician) and Advanced Practice Providers (APPs -  Physician Assistants and Nurse Practitioners) who all work together to provide you with the care you need, when you need it.  We recommend signing up for the patient portal called "MyChart".  Sign up information is provided on this After Visit Summary.  MyChart is used to connect with patients for Virtual Visits (Telemedicine).  Patients are able to view lab/test results, encounter notes, upcoming appointments, etc.  Non-urgent messages can be sent to your provider as well.   To learn more about what you can do with MyChart, go to [email or web address].    Your next appointment:   6 month(s)  The format for your next appointment:   In  Person  Provider:   Christell Constant, MD     Other Instructions Christena Deem- Long Term Monitor Instructions  Your physician has requested you wear a ZIO patch monitor for 7 days.  This is a single patch monitor. [employer] supplies one patch monitor per enrollment. Additional stickers are not available. Please do not apply patch if you will be having a Nuclear Stress Test,  Echocardiogram, Cardiac CT, MRI, or Chest Xray during the period you would be wearing the  monitor. The patch cannot be worn during these tests. You cannot remove and re-apply the  ZIO XT patch monitor.  Your ZIO patch monitor will be mailed 3 day USPS to your address on file. It may take 3-5 days  to receive your monitor after you have been enrolled.  Once you have received your monitor, please review the enclosed instructions. Your monitor  has already been registered assigning a specific monitor serial # to you.  Billing and Patient Assistance Program Information  We have supplied [employer] with any of your insurance information on file for billing purposes. [employer] offers a sliding scale Patient Assistance Program for patients that do not have  insurance, or whose insurance does not completely cover the cost of the ZIO monitor.  You must apply for the Patient Assistance Program to qualify for this discounted rate.  To apply, please call [employer] at [phone], select option 4, select option 2, ask to apply for  Patient Assistance Program. [name] will ask your household income, and how many people  are in your household. They will quote your out-of-pocket cost based on that information.  [employer] will also be able to set up a 59-month, interest-free payment plan if needed.  Applying the monitor   Shave hair from upper left chest.  Hold abrader disc by orange tab. Rub abrader in 40 strokes over the upper left chest as  indicated in your monitor instructions.  Clean area with 4 enclosed alcohol pads. Let dry.   Apply patch as indicated in monitor instructions. Patch will be placed under collarbone  on left  side of chest with arrow pointing upward.  Rub patch adhesive wings for 2 minutes. Remove white label marked "1". Remove the white  label marked "2". Rub patch adhesive wings for 2 additional minutes.  While looking in a mirror, press and release button in center of patch. A small green light will  flash 3-4 times. This will be your only indicator that the monitor has been turned on.  Do not shower for the first 24 hours. You may shower after the first 24 hours.  Press the button if you feel a symptom. You will hear a small click. Record Date, Time and  Symptom in the Patient Logbook.  When you are ready to remove the patch, follow instructions on the last 2 pages of Patient  Logbook. Stick patch monitor onto the last page of Patient Logbook.  Place Patient Logbook in the blue and white box. Use locking tab on box and tape box closed  securely. The blue and white box has prepaid postage on it. Please place it in the mailbox as  soon as possible. Your physician should have your test results approximately 7 days after the  monitor has been mailed back to [facility].  Call [facility] Customer Care at [phone] if you have questions regarding  your ZIO XT patch monitor. Call them immediately if you see an orange light blinking on your  monitor.  If your monitor falls off in less than 4 days, contact our Monitor department at [phone].  If your monitor becomes loose or falls off after 4 days call [employer] at [phone] for  suggestions on securing your monitor   Important Information About Sugar         Signed, Christell Constant, MD  11/17/2022 12:13 PM    [facility]

## 2022-11-17 ENCOUNTER — Ambulatory Visit (INDEPENDENT_AMBULATORY_CARE_PROVIDER_SITE_OTHER): Payer: Medicare Other

## 2022-11-17 ENCOUNTER — Ambulatory Visit: Payer: Medicare Other | Attending: Internal Medicine | Admitting: Internal Medicine

## 2022-11-17 VITALS — BP 128/68 | HR 108 | Ht 65.0 in | Wt 125.0 lb

## 2022-11-17 DIAGNOSIS — I493 Ventricular premature depolarization: Secondary | ICD-10-CM | POA: Diagnosis not present

## 2022-11-17 DIAGNOSIS — I491 Atrial premature depolarization: Secondary | ICD-10-CM

## 2022-11-17 DIAGNOSIS — R002 Palpitations: Secondary | ICD-10-CM | POA: Diagnosis not present

## 2022-11-17 NOTE — Progress Notes (Unsigned)
Enrolled patient for a 7 day Zio XT monitor to be mailed to patients home   [id number] mailed to patient 11/17/22 and applied to patient 11/23/22.

## 2022-11-17 NOTE — Patient Instructions (Addendum)
Medication Instructions:  Your physician recommends that you continue on your current medications as directed. Please refer to the Current Medication list given to you today.  *If you need a refill on your cardiac medications before your next appointment, please call your pharmacy*   Lab Work: NONE If you have labs (blood work) drawn today and your tests are completely normal, you will receive your results only by: MyChart Message (if you have MyChart) OR A paper copy in the mail If you have any lab test that is abnormal or we need to change your treatment, we will call you to review the results.   Testing/Procedures: Your physician has requested that you wear a heart monitor.    Follow-Up: At [facility], you and your health needs are our priority.  As part of our continuing mission to provide you with exceptional heart care, we have created designated Provider Care Teams.  These Care Teams include your primary Cardiologist (physician) and Advanced Practice Providers (APPs -  Physician Assistants and Nurse Practitioners) who all work together to provide you with the care you need, when you need it.  We recommend signing up for the patient portal called "MyChart".  Sign up information is provided on this After Visit Summary.  MyChart is used to connect with patients for Virtual Visits (Telemedicine).  Patients are able to view lab/test results, encounter notes, upcoming appointments, etc.  Non-urgent messages can be sent to your provider as well.   To learn more about what you can do with MyChart, go to [email or web address].    Your next appointment:   6 month(s)  The format for your next appointment:   In Person  Provider:   Christell Constant, MD     Other Instructions Virginia [name]- Long Term Monitor Instructions  Your physician has requested you wear a ZIO patch monitor for 7 days.  This is a single patch monitor. [employer] supplies one patch monitor per enrollment.  Additional stickers are not available. Please do not apply patch if you will be having a Nuclear Stress Test,  Echocardiogram, Cardiac CT, MRI, or Chest Xray during the period you would be wearing the  monitor. The patch cannot be worn during these tests. You cannot remove and re-apply the  ZIO XT patch monitor.  Your ZIO patch monitor will be mailed 3 day USPS to your address on file. It may take 3-5 days  to receive your monitor after you have been enrolled.  Once you have received your monitor, please review the enclosed instructions. Your monitor  has already been registered assigning a specific monitor serial # to you.  Billing and Patient Assistance Program Information  We have supplied [employer] with any of your insurance information on file for billing purposes. [employer] offers a sliding scale Patient Assistance Program for patients that do not have  insurance, or whose insurance does not completely cover the cost of the ZIO monitor.  You must apply for the Patient Assistance Program to qualify for this discounted rate.  To apply, please call [employer] at [phone], select option 4, select option 2, ask to apply for  Patient Assistance Program. [name] will ask your household income, and how many people  are in your household. They will quote your out-of-pocket cost based on that information.  [employer] will also be able to set up a 15-month, interest-free payment plan if needed.  Applying the monitor   Shave hair from upper left chest.  Hold abrader disc by orange tab.  Rub abrader in 40 strokes over the upper left chest as  indicated in your monitor instructions.  Clean area with 4 enclosed alcohol pads. Let dry.  Apply patch as indicated in monitor instructions. Patch will be placed under collarbone on left  side of chest with arrow pointing upward.  Rub patch adhesive wings for 2 minutes. Remove white label marked "1". Remove the white  label marked "2". Rub patch adhesive wings  for 2 additional minutes.  While looking in a mirror, press and release button in center of patch. A small green light will  flash 3-4 times. This will be your only indicator that the monitor has been turned on.  Do not shower for the first 24 hours. You may shower after the first 24 hours.  Press the button if you feel a symptom. You will hear a small click. Record Date, Time and  Symptom in the Patient Logbook.  When you are ready to remove the patch, follow instructions on the last 2 pages of Patient  Logbook. Stick patch monitor onto the last page of Patient Logbook.  Place Patient Logbook in the blue and white box. Use locking tab on box and tape box closed  securely. The blue and white box has prepaid postage on it. Please place it in the mailbox as  soon as possible. Your physician should have your test results approximately 7 days after the  monitor has been mailed back to [facility].  Call [facility] Customer Care at [phone] if you have questions regarding  your ZIO XT patch monitor. Call them immediately if you see an orange light blinking on your  monitor.  If your monitor falls off in less than 4 days, contact our Monitor department at [phone].  If your monitor becomes loose or falls off after 4 days call [employer] at [phone] for  suggestions on securing your monitor   Important Information About Sugar

## 2022-11-22 ENCOUNTER — Telehealth: Payer: Self-pay | Admitting: Internal Medicine

## 2022-11-22 NOTE — Telephone Encounter (Signed)
Returned call to patient and scheduled her to come into office 12/7 @ 10 am to have monitor applied

## 2022-11-22 NOTE — Telephone Encounter (Signed)
Pt is calling back to discuss appt to hopefully come in today to have heart monitor placed. She states that tomorrow morning would be okay to see someone, as well.

## 2022-11-22 NOTE — Telephone Encounter (Signed)
Pt is requesting a visit to come in to help her put on heart monitor. Requesting call back.

## 2022-11-23 ENCOUNTER — Ambulatory Visit: Payer: Medicare Other | Attending: Internal Medicine

## 2022-11-23 DIAGNOSIS — R002 Palpitations: Secondary | ICD-10-CM

## 2022-11-23 DIAGNOSIS — I493 Ventricular premature depolarization: Secondary | ICD-10-CM | POA: Diagnosis not present

## 2022-11-23 DIAGNOSIS — I491 Atrial premature depolarization: Secondary | ICD-10-CM | POA: Diagnosis not present

## 2022-11-24 ENCOUNTER — Telehealth: Payer: Self-pay | Admitting: Internal Medicine

## 2022-11-24 NOTE — Telephone Encounter (Addendum)
Patient has a rash that is spreading around monitor and up her neck. She was asking if she could use  hydrocortisone cream on her neck. I assured her it was fine. She would like to try and get a few more days of data on her monitor. I advised her to please remove the monitor if her rash gets worse and to mail it back.

## 2022-11-24 NOTE — Telephone Encounter (Signed)
  Pt requesting to speak with Virginia [name] again regarding her heart monitor. She said, she is having allergic reactions with the adhesive

## 2022-11-27 ENCOUNTER — Emergency Department (HOSPITAL_COMMUNITY): Payer: Medicare Other

## 2022-11-27 ENCOUNTER — Emergency Department (HOSPITAL_COMMUNITY)
Admission: EM | Admit: 2022-11-27 | Discharge: 2022-11-27 | Disposition: A | Discharge status: Home / Self Care | Payer: Medicare Other | Attending: Emergency Medicine | Admitting: Emergency Medicine

## 2022-11-27 DIAGNOSIS — S0990XA Unspecified injury of head, initial encounter: Secondary | ICD-10-CM | POA: Diagnosis not present

## 2022-11-27 DIAGNOSIS — S299XXA Unspecified injury of thorax, initial encounter: Secondary | ICD-10-CM | POA: Insufficient documentation

## 2022-11-27 DIAGNOSIS — S60222A Contusion of left hand, initial encounter: Secondary | ICD-10-CM | POA: Diagnosis not present

## 2022-11-27 DIAGNOSIS — S8010XA Contusion of unspecified lower leg, initial encounter: Secondary | ICD-10-CM | POA: Insufficient documentation

## 2022-11-27 DIAGNOSIS — K5730 Diverticulosis of large intestine without perforation or abscess without bleeding: Secondary | ICD-10-CM | POA: Insufficient documentation

## 2022-11-27 DIAGNOSIS — Z7951 Long term (current) use of inhaled steroids: Secondary | ICD-10-CM | POA: Diagnosis not present

## 2022-11-27 DIAGNOSIS — S298XXA Other specified injuries of thorax, initial encounter: Secondary | ICD-10-CM

## 2022-11-27 DIAGNOSIS — J449 Chronic obstructive pulmonary disease, unspecified: Secondary | ICD-10-CM | POA: Insufficient documentation

## 2022-11-27 DIAGNOSIS — Y92410 Unspecified street and highway as the place of occurrence of the external cause: Secondary | ICD-10-CM | POA: Diagnosis not present

## 2022-11-27 DIAGNOSIS — M79604 Pain in right leg: Secondary | ICD-10-CM | POA: Diagnosis present

## 2022-11-27 LAB — COMPREHENSIVE METABOLIC PANEL
ALT: 16 U/L (ref 0–44)
AST: 28 U/L (ref 15–41)
Albumin: 3.7 g/dL (ref 3.5–5.0)
Alkaline Phosphatase: 54 U/L (ref 38–126)
Anion gap: 12 (ref 5–15)
BUN: 22 mg/dL (ref 8–23)
CO2: 22 mmol/L (ref 22–32)
Calcium: 9 mg/dL (ref 8.9–10.3)
Chloride: 104 mmol/L (ref 98–111)
Creatinine, Ser: 0.9 mg/dL (ref 0.44–1.00)
GFR, Estimated: >60 mL/min (ref >60)
Glucose, Bld: 141 mg/dL — ABNORMAL HIGH (ref 70–99)
Potassium: 4.2 mmol/L (ref 3.5–5.1)
Sodium: 138 mmol/L (ref 135–145)
Total Bilirubin: 0.3 mg/dL (ref 0.3–1.2)
Total Protein: 6.6 g/dL (ref 6.5–8.1)

## 2022-11-27 LAB — I-STAT CHEM 8, ED
BUN: 27 mg/dL — ABNORMAL HIGH (ref 8–23)
Calcium, Ion: 1.12 mmol/L — ABNORMAL LOW (ref 1.15–1.40)
Chloride: 105 mmol/L (ref 98–111)
Creatinine, Ser: 0.9 mg/dL (ref 0.44–1.00)
Glucose, Bld: 136 mg/dL — ABNORMAL HIGH (ref 70–99)
HCT: 43 % (ref 36.0–46.0)
Hemoglobin: 14.6 g/dL (ref 12.0–15.0)
Potassium: 4.2 mmol/L (ref 3.5–5.1)
Sodium: 140 mmol/L (ref 135–145)
TCO2: 26 mmol/L (ref 22–32)

## 2022-11-27 LAB — CBC WITH DIFFERENTIAL/PLATELET
Abs Immature Granulocytes: 0.05 10*3/uL (ref 0.00–0.07)
Basophils Absolute: 0.1 10*3/uL (ref 0.0–0.1)
Basophils Relative: 1 %
Eosinophils Absolute: 0.2 10*3/uL (ref 0.0–0.5)
Eosinophils Relative: 2 %
HCT: 43.5 % (ref 36.0–46.0)
Hemoglobin: 14.2 g/dL (ref 12.0–15.0)
Immature Granulocytes: 1 %
Lymphocytes Relative: 25 %
Lymphs Abs: 2.3 10*3/uL (ref 0.7–4.0)
MCH: 30.4 pg (ref 26.0–34.0)
MCHC: 32.6 g/dL (ref 30.0–36.0)
MCV: 93.1 fL (ref 80.0–100.0)
Monocytes Absolute: 0.6 10*3/uL (ref 0.1–1.0)
Monocytes Relative: 6 %
Neutro Abs: 6.2 10*3/uL (ref 1.7–7.7)
Neutrophils Relative %: 65 %
Platelets: 174 10*3/uL (ref 150–400)
RBC: 4.67 MIL/uL (ref 3.87–5.11)
RDW: 14.1 % (ref 11.5–15.5)
WBC: 9.5 10*3/uL (ref 4.0–10.5)
nRBC: 0 % (ref 0.0–0.2)

## 2022-11-27 MED ORDER — ACETAMINOPHEN 500 MG PO TABS
500.0000 mg | ORAL_TABLET | Freq: Once | ORAL | Status: AC
  Administered 2022-11-27: 500 mg via ORAL
  Filled 2022-11-27: qty 1

## 2022-11-27 MED ORDER — IOHEXOL 350 MG/ML SOLN
75.0000 mL | Freq: Once | INTRAVENOUS | Status: AC | PRN
  Administered 2022-11-27: 75 mL via INTRAVENOUS

## 2022-11-27 MED ORDER — FENTANYL CITRATE PF 50 MCG/ML IJ SOSY: 25.0000 ug | PREFILLED_SYRINGE | Freq: Once | INTRAMUSCULAR | Status: DC

## 2022-11-27 NOTE — Progress Notes (Signed)
Orthopedic Tech Progress Note Patient Details:  Virginia [name] [birth year] [id number] Level 2 Trauma. Not needed Patient ID: [name], female   DOB: 1941-06-07, 81 y.o.   MRN: [id number]  [name] 11/27/2022, 5:24 PM

## 2022-11-27 NOTE — ED Notes (Signed)
Pt understands discharge teaching, pt able to use wheelchair to be discharged home with son
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
## 2022-11-28 ENCOUNTER — Telehealth: Payer: Self-pay | Admitting: Internal Medicine

## 2022-11-28 NOTE — Telephone Encounter (Signed)
Pt wanted to make office aware that she sent back monitor today. She also would like for provider to take a look at test results that were done yesterday at the ED. Please advise

## 2022-11-29 ENCOUNTER — Telehealth: Payer: Self-pay | Admitting: Pulmonary Disease
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
## 2022-11-29 NOTE — Telephone Encounter (Signed)
Called and spoke to patient and advised her that she will possibly have soreness for days to weeks. And that if she is still having soreness to call PCP next week. Next available for [place] in Jan 3rd. She states she did not want to wait that long. Nothing further needed
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
## 2022-12-06 ENCOUNTER — Encounter: Payer: Self-pay | Admitting: Internal Medicine

## 2022-12-07 ENCOUNTER — Telehealth: Payer: Self-pay | Admitting: Pulmonary Disease

## 2022-12-07 DIAGNOSIS — J4489 Other specified chronic obstructive pulmonary disease: Secondary | ICD-10-CM

## 2022-12-07 MED ORDER — AZITHROMYCIN 250 MG PO TABS: ORAL_TABLET | ORAL | 0 refills | Status: DC

## 2022-12-07 MED ORDER — PREDNISONE 10 MG PO TABS: ORAL_TABLET | ORAL | 0 refills | Status: AC

## 2022-12-07 NOTE — Telephone Encounter (Signed)
I have sent in extended steroid taper and Zpak.  She needs incentive spirometer to make sure she is taking nice deep breaths.   She can take tylenol and ibuprofen as needed for the chest discomfort, follow directions on bottles.   Thanks, JD

## 2022-12-07 NOTE — Telephone Encounter (Signed)
Called and spoke to patient and let her know the recommendations form Dr Francine Graven. She states that she understands and that she has an office visit with Dr Francine Graven 12/20/22. Nothing further needed
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
## 2022-12-07 NOTE — Telephone Encounter (Signed)
Called patient but she did not answer. Her VM is not setup so I was not able to leave a VM. Will attempt to call back later.

## 2022-12-20 ENCOUNTER — Encounter: Payer: Self-pay | Admitting: Pulmonary Disease

## 2022-12-20 ENCOUNTER — Ambulatory Visit: Payer: Medicare Other | Admitting: Pulmonary Disease

## 2022-12-20 VITALS — BP 126/82 | HR 94 | Ht 65.0 in | Wt 125.0 lb

## 2022-12-20 DIAGNOSIS — J4489 Other specified chronic obstructive pulmonary disease: Secondary | ICD-10-CM

## 2022-12-20 DIAGNOSIS — J439 Emphysema, unspecified: Secondary | ICD-10-CM | POA: Diagnosis not present

## 2022-12-20 DIAGNOSIS — M7989 Other specified soft tissue disorders: Secondary | ICD-10-CM
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
## 2022-12-20 NOTE — Patient Instructions (Addendum)
Continue anoro ellipta 1 puff daily   Continue flovent 2 puffs twice daily   Continue montelukast daily   Continue allegra daily   Continue flonase 1 spray per nostril daily   We will check lower extremity ultra sounds to make sure there are no blood clots in your legs   Follow up in 3 months

## 2022-12-25 ENCOUNTER — Ambulatory Visit (HOSPITAL_COMMUNITY)
Admission: RE | Admit: 2022-12-25 | Discharge: 2022-12-25 | Disposition: A | Discharge status: Home / Self Care | Payer: Medicare Other | Source: Ambulatory Visit | Attending: Pulmonary Disease | Admitting: Pulmonary Disease

## 2022-12-25 DIAGNOSIS — M7989 Other specified soft tissue disorders: Secondary | ICD-10-CM | POA: Insufficient documentation

## 2023-01-11 ENCOUNTER — Telehealth (HOSPITAL_COMMUNITY): Payer: Self-pay

## 2023-01-11 NOTE — Telephone Encounter (Signed)
Called patient to see if she was interested in participating in the Pulmonary Rehab Program. Patient stated yes. Patient will come in for orientation on 02/12/23@10 :30am and will attend the 10:15am exercise class.   Sent information via [employer]

## 2023-01-11 NOTE — Telephone Encounter (Signed)
Pt insurance is active and benefits verified through [facility] Medicare Co-pay $15, DED 0/0 met, out of pocket $3.600/$20 met, co-insurance 0%. no pre-authorization required, Brenda/[employer] 01/11/2023@3 :43pm, REF# [id number]

## 2023-02-06 ENCOUNTER — Other Ambulatory Visit: Payer: Self-pay | Admitting: Pulmonary Disease

## 2023-02-12 ENCOUNTER — Telehealth (HOSPITAL_COMMUNITY): Payer: Self-pay

## 2023-02-12 ENCOUNTER — Ambulatory Visit (HOSPITAL_COMMUNITY): Payer: Medicare Other

## 2023-02-12 NOTE — Telephone Encounter (Signed)
Called Pat to see if she was coming to her orientation appt. Fraser Din stated that she did not get a MyChart notification for this appt. Will send her file back to scheduling staff. Pt is ok with being scheduled on Wednesday, 2/28.

## 2023-02-12 NOTE — Telephone Encounter (Signed)
Pt returned PR phone call and reschedule orientation.    Patient will come in for orientation on 02/14/23 @ 1PM and will attend the 10:15AM exercise class.

## 2023-02-13 ENCOUNTER — Telehealth: Payer: Self-pay | Admitting: Pulmonary Disease

## 2023-02-13 DIAGNOSIS — J441 Chronic obstructive pulmonary disease with (acute) exacerbation: Secondary | ICD-10-CM

## 2023-02-13 MED ORDER — PREDNISONE 10 MG PO TABS: 40.0000 mg | ORAL_TABLET | Freq: Every day | ORAL | 0 refills | Status: DC

## 2023-02-13 MED ORDER — AZITHROMYCIN 250 MG PO TABS: ORAL_TABLET | ORAL | 0 refills | Status: DC

## 2023-02-13 NOTE — Telephone Encounter (Signed)
Zpak and '40mg'$  prednisone daily sent to her pharmacy for copd exacerbation.  Thanks, JD

## 2023-02-13 NOTE — Telephone Encounter (Signed)
Patient states having symptom of cough. Patient had RSV vaccine. Pharmacy is [facility]. Patient phone number is [phone].

## 2023-02-13 NOTE — Telephone Encounter (Signed)
Spoke with patient advised zpak and prednisone has been sent to pharmacy. Nothing further needed

## 2023-02-13 NOTE — Telephone Encounter (Signed)
Spoke with the pt  She c/o wheezing and cough over the past wk  She occ produces some white sputum  She has some PND- takes chlorpheniramine every night  She is also taking her singulair, anoro, albuterol, flonase, allegra, flovent  She tried robitussin dm for cough without relief  Denies any fevers, aches   She wants recommendations for cough and wheezing and wanted Dr Erin Fulling to know that she started pulm rehab tomorrow

## 2023-02-14 ENCOUNTER — Encounter (HOSPITAL_COMMUNITY)
Admission: RE | Admit: 2023-02-14 | Discharge: 2023-02-14 | Disposition: A | Discharge status: Home / Self Care | Payer: Medicare Other | Source: Ambulatory Visit | Attending: Pulmonary Disease | Admitting: Pulmonary Disease

## 2023-02-14 ENCOUNTER — Encounter (HOSPITAL_COMMUNITY): Payer: Self-pay

## 2023-02-14 VITALS — BP 132/76 | HR 107 | Ht 65.0 in | Wt 120.2 lb

## 2023-02-14 DIAGNOSIS — J449 Chronic obstructive pulmonary disease, unspecified: Secondary | ICD-10-CM | POA: Diagnosis present

## 2023-02-14 NOTE — Progress Notes (Signed)
Pulmonary Rehab Orientation Physical Assessment Note  Physical assessment reveals patient is alert and oriented x 4.  Heart rate is normal, breath sounds clear in upper lobes, diminished in lower lobes to auscultation, no wheezes, rales, or rhonchi. Reports productive cough with white sputum. She stated she is currently taking prednisone and a z-pack for a current infection. Bowel sounds present x4 quadrants.  Pt denies abdominal discomfort, nausea, vomiting or diarrhea. Grip strength equal, strong. Distal pulses +2; no swelling to lower extremities.   Janine Ores, RN, BSN

## 2023-02-14 NOTE — Progress Notes (Signed)
Pulmonary Individual Treatment Plan  Patient Details  Name: Virginia [name] MRN: [id number] Date of Birth: Oct 26, 1941 Referring Provider:   April Manson Pulmonary Rehab Walk Test from 02/14/2023 in [facility] [place]  Referring Provider Dewald       Initial Encounter Date:  Flowsheet Row Pulmonary Rehab Walk Test from 02/14/2023 in [facility]  Date 02/14/23       Visit Diagnosis: Stage 3 severe COPD by GOLD classification ([place])  Patient's Home Medications on Admission:   Current Outpatient Medications:    albuterol (VENTOLIN HFA) 108 (90 Base) MCG/ACT inhaler, Inhale 1 puff into the lungs every 6 (six) hours as needed for wheezing or shortness of breath., Disp: , Rfl:    ANORO ELLIPTA 62.5-25 MCG/ACT AEPB, Inhale 1 puff into the lungs daily., Disp: , Rfl:    azithromycin (ZITHROMAX) 250 MG tablet, Take as directed, Disp: 6 tablet, Rfl: 0   chlorpheniramine (CHLOR-TRIMETON) 4 MG tablet, Take 4 mg by mouth at bedtime., Disp: , Rfl:    dextromethorphan-guaiFENesin (MUCINEX DM) 30-600 MG 12hr tablet, Take 1 tablet by mouth daily at 6 (six) AM., Disp: , Rfl:    fexofenadine (ALLEGRA) 180 MG tablet, Take 180 mg by mouth daily., Disp: , Rfl:    fluticasone (FLONASE ALLERGY RELIEF) 50 MCG/ACT nasal spray, Place 1 spray into both nostrils as needed for allergies., Disp: , Rfl:    fluticasone (FLOVENT HFA) 110 MCG/ACT inhaler, Inhale 2 puffs into the lungs 2 (two) times daily., Disp: 1 each, Rfl: 12   latanoprost (XALATAN) 0.005 % ophthalmic solution, 1 drop at bedtime., Disp: , Rfl:    magnesium oxide (MAG-OX) 400 MG tablet, Take 400 mg by mouth daily., Disp: , Rfl:    montelukast (SINGULAIR) 10 MG tablet, TAKE 1 TABLET(10 MG) BY MOUTH AT BEDTIME, Disp: 30 tablet, Rfl: 5   predniSONE (DELTASONE) 10 MG tablet, Take 4 tablets (40 mg total) by mouth daily with breakfast., Disp: 20  tablet, Rfl: 0   triamcinolone ointment (KENALOG) 0.1 %, Apply 1 Application topically 2 (two) times daily as needed (rash flare). Do not use on the face, neck, armpits or groin area. Do not use more than 3 weeks in a row., Disp: 30 g, Rfl: 1   calcium carbonate (TUMS - DOSED IN MG ELEMENTAL CALCIUM) 500 MG chewable tablet, Chew 1 tablet by mouth daily. (Patient not taking: Reported on 02/14/2023), Disp: , Rfl:   Past Medical History: Past Medical History:  Diagnosis Date   Allergic    Asthma    COPD (chronic obstructive pulmonary disease) ([place])    Osteopenia    Palpitations     Tobacco Use: Social History   Tobacco Use  Smoking Status Former   Packs/day: 2.00   Years: 42.00   Total pack years: 84.00   Types: Cigarettes   Start date: 12/18/1954   Quit date: 07/12/2002   Years since quitting: 20.6  Smokeless Tobacco Never  Tobacco Comments   Quit smoking for about 5 years - 1974-1979    Labs: Review Flowsheet       Latest Ref Rng & Units 11/27/2022  Labs for ITP Cardiac and Pulmonary Rehab  TCO2 22 - 32 mmol/L 26     Capillary Blood Glucose: No results found for: "GLUCAP"   Pulmonary Assessment Scores:  Pulmonary Assessment Scores     Row Name 02/14/23 1433  ADL UCSD   SOB Score total 64       CAT Score   CAT Score 16       mMRC Score   mMRC Score 3             UCSD: Self-administered rating of dyspnea associated with activities of daily living (ADLs) 6-point scale (0 = "not at all" to 5 = "maximal or unable to do because of breathlessness")  Scoring Scores range from 0 to 120.  Minimally important difference is 5 units  CAT: CAT can identify the health impairment of COPD patients and is better correlated with disease progression.  CAT has a scoring range of zero to 40. The CAT score is classified into four groups of low (less than 10), medium (10 - 20), high (21-30) and very high (31-40) based on the impact level of disease on health status.  A CAT score over 10 suggests significant symptoms.  A worsening CAT score could be explained by an exacerbation, poor medication adherence, poor inhaler technique, or progression of COPD or comorbid conditions.  CAT MCID is 2 points  mMRC: mMRC (Modified Medical Research Council) Dyspnea Scale is used to assess the degree of baseline functional disability in patients of respiratory disease due to dyspnea. No minimal important difference is established. A decrease in score of 1 point or greater is considered a positive change.   Pulmonary Function Assessment:  Pulmonary Function Assessment - 02/14/23 1501       Breath   Bilateral Breath Sounds Clear;Decreased    Shortness of Breath Yes;Limiting activity;Fear of Shortness of Breath             Exercise Target Goals: Exercise Program Goal: Individual exercise prescription set using results from initial 6 min walk test and THRR while considering  patient's activity barriers and safety.   Exercise Prescription Goal: Initial exercise prescription builds to 30-45 minutes a day of aerobic activity, 2-3 days per week.  Home exercise guidelines will be given to patient during program as part of exercise prescription that the participant will acknowledge.  Activity Barriers & Risk Stratification:  Activity Barriers & Cardiac Risk Stratification - 02/14/23 1349       Activity Barriers & Cardiac Risk Stratification   Activity Barriers Deconditioning;Muscular Weakness;Shortness of Breath             6 Minute Walk:  6 Minute Walk     Row Name 02/14/23 1438         6 Minute Walk   Phase Initial     Distance 960 feet     Walk Time 6 minutes     # of Rest Breaks 0     MPH 1.82     METS 2.31     RPE 11     Perceived Dyspnea  1     VO2 Peak 8.1     Symptoms Yes (comment)     Comments dyspnea on exertion     Resting HR 107 bpm     Resting BP 132/76     Resting Oxygen Saturation  92 %     Exercise Oxygen Saturation  during 6  min walk 85 %     Max Ex. HR 120 bpm     Max Ex. BP 144/76     2 Minute Post BP 140/72       Interval HR   1 Minute HR 111     2 Minute HR 118  at 51mn 40sec HR  114     3 Minute HR 120     4 Minute HR 116     5 Minute HR 118     6 Minute HR 120     2 Minute Post HR 108     Interval Heart Rate? Yes       Interval Oxygen   Interval Oxygen? Yes     Baseline Oxygen Saturation % 92 %     1 Minute Oxygen Saturation % 87 %  31 sec O2 85%     1 Minute Liters of Oxygen 3 L  31 sec 2L, 1 min 3L     2 Minute Oxygen Saturation % 94 %  at 13mn 40sec O2 86%     2 Minute Liters of Oxygen 3 L  at 266m 40sec increased to 4L     3 Minute Oxygen Saturation % 94 %     3 Minute Liters of Oxygen 4 L     4 Minute Oxygen Saturation % 92 %     4 Minute Liters of Oxygen 116 L     5 Minute Oxygen Saturation % 93 %     5 Minute Liters of Oxygen 118 L     6 Minute Oxygen Saturation % 93 %     6 Minute Liters of Oxygen 4 L     2 Minute Post Oxygen Saturation % 93 %  after 33m71mO2 92% on RA     2 Minute Post Liters of Oxygen 4 L              Oxygen Initial Assessment:  Oxygen Initial Assessment - 02/14/23 1353       Home Oxygen   Home Oxygen Device None    Sleep Oxygen Prescription None      Initial 6 min Walk   Oxygen Used Continuous    Liters per minute 4      Program Oxygen Prescription   Program Oxygen Prescription Continuous    Liters per minute 4      Intervention   Short Term Goals To learn and exhibit compliance with exercise, home and travel O2 prescription;To learn and understand importance of maintaining oxygen saturations>88%;To learn and demonstrate proper use of respiratory medications;To learn and understand importance of monitoring SPO2 with pulse oximeter and demonstrate accurate use of the pulse oximeter.;To learn and demonstrate proper pursed lip breathing techniques or other breathing techniques.     Long  Term Goals Exhibits compliance with exercise, home  and travel O2  prescription;Maintenance of O2 saturations>88%;Compliance with respiratory medication;Verbalizes importance of monitoring SPO2 with pulse oximeter and return demonstration;Exhibits proper breathing techniques, such as pursed lip breathing or other method taught during program session;Demonstrates proper use of MDI's             Oxygen Re-Evaluation:   Oxygen Discharge (Final Oxygen Re-Evaluation):   Initial Exercise Prescription:  Initial Exercise Prescription - 02/14/23 1400       Date of Initial Exercise RX and Referring Provider   Date 02/14/23    Referring Provider Dewald    Expected Discharge Date 05/10/23      Oxygen   Oxygen Continuous    Liters 4    Maintain Oxygen Saturation 88% or higher      Recumbant Bike   Level 1    RPM 60    Minutes 15    METs 2      NuStep   Level 1    SPM 60  Minutes 15    METs 2      Prescription Details   Frequency (times per week) 2    Duration Progress to 30 minutes of continuous aerobic without signs/symptoms of physical distress      Intensity   THRR 40-80% of Max Heartrate 55-110    Ratings of Perceived Exertion 11-13    Perceived Dyspnea 0-4      Progression   Progression Continue to progress workloads to maintain intensity without signs/symptoms of physical distress.      Resistance Training   Training Prescription Yes    Weight red bands    Reps 10-15             Perform Capillary Blood Glucose checks as needed.  Exercise Prescription Changes:   Exercise Comments:   Exercise Goals and Review:   Exercise Goals     Row Name 02/14/23 1350             Exercise Goals   Increase Physical Activity Yes       Intervention Provide advice, education, support and counseling about physical activity/exercise needs.;Develop an individualized exercise prescription for aerobic and resistive training based on initial evaluation findings, risk stratification, comorbidities and participant's personal goals.        Expected Outcomes Short Term: Attend rehab on a regular basis to increase amount of physical activity.;Long Term: Exercising regularly at least 3-5 days a week.;Long Term: Add in home exercise to make exercise part of routine and to increase amount of physical activity.       Increase Strength and Stamina Yes       Intervention Provide advice, education, support and counseling about physical activity/exercise needs.;Develop an individualized exercise prescription for aerobic and resistive training based on initial evaluation findings, risk stratification, comorbidities and participant's personal goals.       Expected Outcomes Short Term: Increase workloads from initial exercise prescription for resistance, speed, and METs.;Short Term: Perform resistance training exercises routinely during rehab and add in resistance training at home;Long Term: Improve cardiorespiratory fitness, muscular endurance and strength as measured by increased METs and functional capacity (6MWT)       Able to understand and use rate of perceived exertion (RPE) scale Yes       Intervention Provide education and explanation on how to use RPE scale       Expected Outcomes Short Term: Able to use RPE daily in rehab to express subjective intensity level;Long Term:  Able to use RPE to guide intensity level when exercising independently       Able to understand and use Dyspnea scale Yes       Intervention Provide education and explanation on how to use Dyspnea scale       Expected Outcomes Short Term: Able to use Dyspnea scale daily in rehab to express subjective sense of shortness of breath during exertion;Long Term: Able to use Dyspnea scale to guide intensity level when exercising independently       Intervention Provide education and explanation of THRR including how the numbers were predicted and where they are located for reference       Expected Outcomes Short Term: Able to state/look up THRR;Long Term: Able to use THRR to  govern intensity when exercising independently;Short Term: Able to use daily as guideline for intensity in rehab       Understanding of Exercise Prescription Yes       Intervention Provide education, explanation, and written materials on patient's individual exercise prescription  Expected Outcomes Short Term: Able to explain program exercise prescription;Long Term: Able to explain home exercise prescription to exercise independently                Exercise Goals Re-Evaluation :   Discharge Exercise Prescription (Final Exercise Prescription Changes):   Nutrition:  Target Goals: Understanding of nutrition guidelines, daily intake of sodium '1500mg'$ , cholesterol '200mg'$ , calories 30% from fat and 7% or less from saturated fats, daily to have 5 or more servings of fruits and vegetables.  Biometrics:  Pre Biometrics - 02/14/23 1453       Pre Biometrics   Grip Strength 18 kg              Nutrition Therapy Plan and Nutrition Goals:   Nutrition Assessments:  MEDIFICTS Score Key: ?70 Need to make dietary changes  40-70 Heart Healthy Diet ? 40 Therapeutic Level Cholesterol Diet   Picture Your Plate Scores: [id number] Unhealthy dietary pattern with much room for improvement. 41-50 Dietary pattern unlikely to meet recommendations for good health and room for improvement. 51-60 More healthful dietary pattern, with some room for improvement.  >60 Healthy dietary pattern, although there may be some specific behaviors that could be improved.    Nutrition Goals Re-Evaluation:   Nutrition Goals Discharge (Final Nutrition Goals Re-Evaluation):   Psychosocial: Target Goals: Acknowledge presence or absence of significant depression and/or stress, maximize coping skills, provide positive support system. Participant is able to verbalize types and ability to use techniques and skills needed for reducing stress and depression.  Initial Review & Psychosocial Screening:  Initial Psych  Review & Screening - 02/14/23 1346       Initial Review   Current issues with None Identified      Family Dynamics   Good Support System? Yes      Barriers   Psychosocial barriers to participate in program There are no identifiable barriers or psychosocial needs.      Screening Interventions   Interventions Encouraged to exercise    Expected Outcomes --             Quality of Life Scores:  Scores of 19 and below usually indicate a poorer quality of life in these areas.  A difference of  2-3 points is a clinically meaningful difference.  A difference of 2-3 points in the total score of the Quality of Life Index has been associated with significant improvement in overall quality of life, self-image, physical symptoms, and general health in studies assessing change in quality of life.  PHQ-9: Review Flowsheet       02/14/2023 10/08/2015  Depression screen PHQ 2/9  Decreased Interest 0 0  Down, Depressed, Hopeless 0 0  PHQ - 2 Score 0 0  Altered sleeping 0 -  Tired, decreased energy 1 -  Change in appetite 0 -  Feeling bad or failure about yourself  0 -  Trouble concentrating 0 -  Moving slowly or fidgety/restless 0 -  Suicidal thoughts 0 -  PHQ-9 Score 1 -  Difficult doing work/chores Somewhat difficult -   Interpretation of Total Score  Total Score Depression Severity:  1-4 = Minimal depression, 5-9 = Mild depression, 10-14 = Moderate depression, 15-19 = Moderately severe depression, 20-27 = Severe depression   Psychosocial Evaluation and Intervention:  Psychosocial Evaluation - 02/14/23 1431       Psychosocial Evaluation & Interventions   Interventions Encouraged to exercise with the program and follow exercise prescription    Comments Fraser Din denies any psychsocial  issues             Psychosocial Re-Evaluation:   Psychosocial Discharge (Final Psychosocial Re-Evaluation):   Education: Education Goals: Education classes will be provided on a weekly  basis, covering required topics. Participant will state understanding/return demonstration of topics presented.  Learning Barriers/Preferences:  Learning Barriers/Preferences - 02/14/23 1431       Learning Barriers/Preferences   Learning Barriers None    Learning Preferences Written Material             Education Topics: Introduction to Pulmonary Rehab Group instruction provided by PowerPoint, verbal discussion, and written material to support subject matter. Instructor reviews what Pulmonary Rehab is, the purpose of the program, and how patients are referred.     Know Your Numbers Group instruction that is supported by a PowerPoint presentation. Instructor discusses importance of knowing and understanding resting, exercise, and post-exercise oxygen saturation, heart rate, and blood pressure. Oxygen saturation, heart rate, blood pressure, rating of perceived exertion, and dyspnea are reviewed along with a normal range for these values.    Exercise for the Pulmonary Patient Group instruction that is supported by a PowerPoint presentation. Instructor discusses benefits of exercise, core components of exercise, frequency, duration, and intensity of an exercise routine, importance of utilizing pulse oximetry during exercise, safety while exercising, and options of places to exercise outside of rehab.       MET Level  Group instruction provided by PowerPoint, verbal discussion, and written material to support subject matter. Instructor reviews what METs are and how to increase METs.    Pulmonary Medications Verbally interactive group education provided by instructor with focus on inhaled medications and proper administration.   Anatomy and Physiology of the Respiratory System Group instruction provided by PowerPoint, verbal discussion, and written material to support subject matter. Instructor reviews respiratory cycle and anatomical components of the respiratory system and their  functions. Instructor also reviews differences in obstructive and restrictive respiratory diseases with examples of each.    Oxygen Safety Group instruction provided by PowerPoint, verbal discussion, and written material to support subject matter. There is an overview of "What is Oxygen" and "Why do we need it".  Instructor also reviews how to create a safe environment for oxygen use, the importance of using oxygen as prescribed, and the risks of noncompliance. There is a brief discussion on traveling with oxygen and resources the patient may utilize.   Oxygen Use Group instruction provided by PowerPoint, verbal discussion, and written material to discuss how supplemental oxygen is prescribed and different types of oxygen supply systems. Resources for more information are provided.    Breathing Techniques Group instruction that is supported by demonstration and informational handouts. Instructor discusses the benefits of pursed lip and diaphragmatic breathing and detailed demonstration on how to perform both.     Risk Factor Reduction Group instruction that is supported by a PowerPoint presentation. Instructor discusses the definition of a risk factor, different risk factors for pulmonary disease, and how the heart and lungs work together.   MD Day A group question and answer session with a medical doctor that allows participants to ask questions that relate to their pulmonary disease state.   Nutrition for the Pulmonary Patient Group instruction provided by PowerPoint slides, verbal discussion, and written materials to support subject matter. The instructor gives an explanation and review of healthy diet recommendations, which includes a discussion on weight management, recommendations for fruit and vegetable consumption, as well as protein, fluid, caffeine, fiber, sodium, sugar, and  alcohol. Tips for eating when patients are short of breath are discussed.    Other Education Group or  individual verbal, written, or video instructions that support the educational goals of the pulmonary rehab program.    Knowledge Questionnaire Score:  Knowledge Questionnaire Score - 02/14/23 1447       Knowledge Questionnaire Score   Pre Score 12/18             Core Components/Risk Factors/Patient Goals at Admission:  Personal Goals and Risk Factors at Admission - 02/14/23 1348       Core Components/Risk Factors/Patient Goals on Admission    Weight Management Weight Maintenance    Improve shortness of breath with ADL's Yes    Intervention Provide education, individualized exercise plan and daily activity instruction to help decrease symptoms of SOB with activities of daily living.    Expected Outcomes Short Term: Improve cardiorespiratory fitness to achieve a reduction of symptoms when performing ADLs;Long Term: Be able to perform more ADLs without symptoms or delay the onset of symptoms    Increase knowledge of respiratory medications and ability to use respiratory devices properly  Yes    Intervention Provide education and demonstration as needed of appropriate use of medications, inhalers, and oxygen therapy.    Expected Outcomes Short Term: Achieves understanding of medications use. Understands that oxygen is a medication prescribed by physician. Demonstrates appropriate use of inhaler and oxygen therapy.;Long Term: Maintain appropriate use of medications, inhalers, and oxygen therapy.             Core Components/Risk Factors/Patient Goals Review:    Core Components/Risk Factors/Patient Goals at Discharge (Final Review):    ITP Comments:   Comments: Dr. Rodman Pickle is Medical Director for Pulmonary Rehab at [facility].

## 2023-02-14 NOTE — Progress Notes (Signed)
   02/14/23 1438  6 Minute Walk  Phase Initial  Distance 960 feet  Walk Time 6 minutes  # of Rest Breaks 0  MPH 1.82  METS 2.31  RPE 11  Perceived Dyspnea  1  VO2 Peak 8.1  Symptoms Yes (comment)  Comments dyspnea on exertion  Resting HR 107 bpm  Resting BP 132/76  Resting Oxygen Saturation  92 %  Exercise Oxygen Saturation  during 6 min walk 85 %  Max Ex. HR 120 bpm  Max Ex. BP 144/76  2 Minute Post BP 140/72  Interval HR  Interval Heart Rate? Yes  1 Minute HR 111  2 Minute HR 118 (at 66mn 40sec HR 114)  3 Minute HR 120  4 Minute HR 116  5 Minute HR 118  6 Minute HR 120  2 Minute Post HR 108  Interval Oxygen  Interval Oxygen? Yes  Baseline Oxygen Saturation % 92 %  1 Minute Oxygen Saturation % 87 % (31 sec O2 85%)  1 Minute Liters of Oxygen 3 L (31 sec 2L, 1 min 3L)  2 Minute Oxygen Saturation % 94 % (at 228m 40sec O2 86%)  2 Minute Liters of Oxygen 3 L (at 85m285m40sec increased to 4L)  3 Minute Oxygen Saturation % 94 %  3 Minute Liters of Oxygen 4 L  4 Minute Oxygen Saturation % 92 %  4 Minute Liters of Oxygen 116 L  5 Minute Oxygen Saturation % 93 %  5 Minute Liters of Oxygen 118 L  6 Minute Oxygen Saturation % 93 %  6 Minute Liters of Oxygen 4 L  2 Minute Post Oxygen Saturation % 93 % (after 5mi58m2 92% on RA)  2 Minute Post Liters of Oxygen 4 L

## 2023-02-14 NOTE — Progress Notes (Signed)
Virginia [name] 82 y.o. female Pulmonary Rehab Orientation Note This patient who was referred to Pulmonary Rehab by Dr. Erin Fulling with the diagnosis of COPD 3 arrived today in Cardiac and Pulmonary Rehab. She  arrived ambulatory with assistive device with normal gait. She  does not carry portable oxygen. Per patient, Virginia [name] uses oxygen never. Color good, skin warm and dry. Patient is oriented to time and place. Patient's medical history, psychosocial health, and medications reviewed. Psychosocial assessment reveals patient lives with alone. Virginia [name] is currently retired. Patient hobbies include reading and genealogy . Patient reports her stress level is low. Areas of stress/anxiety include health. Patient does not exhibit signs of depression. PHQ2/9 score 0/1. Virginia [name] shows good  coping skills with positive outlook on life. Offered emotional support and reassurance. Will continue to monitor. Physical assessment performed by Nurse pick: Janine Ores RN. Please see their orientation physical assessment note. Virginia [name] reports she does take medications as prescribed. Patient states she follows a regular  diet. The patient reports no specific efforts to gain or lose weight.. Patient's weight will be monitored closely. Demonstration and practice of PLB using pulse oximeter. Virginia [name] able to return demonstration satisfactorily. Safety and hand hygiene in the exercise area reviewed with patient. Virginia [name] voices understanding of the information reviewed. Department expectations discussed with patient and achievable goals were set. The patient shows enthusiasm about attending the program and we look forward to working with Virginia [name]. Virginia [name] completed a 6 min walk test today and is scheduled to begin exercise on 02/20/23.   [place], BSRT

## 2023-02-20 ENCOUNTER — Encounter (HOSPITAL_COMMUNITY)
Admission: RE | Admit: 2023-02-20 | Discharge: 2023-02-20 | Disposition: A | Discharge status: Home / Self Care | Payer: Medicare Other | Source: Ambulatory Visit | Attending: Pulmonary Disease | Admitting: Pulmonary Disease

## 2023-02-20 ENCOUNTER — Ambulatory Visit (HOSPITAL_COMMUNITY): Payer: Medicare Other

## 2023-02-20 DIAGNOSIS — Z5189 Encounter for other specified aftercare: Secondary | ICD-10-CM | POA: Diagnosis present

## 2023-02-20 DIAGNOSIS — J449 Chronic obstructive pulmonary disease, unspecified: Secondary | ICD-10-CM | POA: Insufficient documentation

## 2023-02-20 NOTE — Progress Notes (Signed)
Daily Session Note  Patient Details  Name: Virginia [name] MRN: [id number] Date of Birth: 06-15-1941 Referring Provider:   April Manson Pulmonary Rehab Walk Test from 02/14/2023 in [facility] for [facility], Vascular, & [place]  Referring Provider Dewald       Encounter Date: 02/20/2023  Check In:  Session Check In - 02/20/23 1219       Check-In   Supervising physician immediately available to respond to emergencies [facility] MD immediately available    Physician(s) Gala Romney    Location [facility]    Staff Present Janine Ores, RN, Quentin Ore, MS, ACSM-CEP, Exercise Physiologist;Jakita Dutkiewicz Olen Cordial BS, ACSM-CEP, Exercise Physiologist;Other    Virtual Visit No    Medication changes reported     No    Fall or balance concerns reported    Yes    Comments pt uses a cane    Tobacco Cessation No Change    Warm-up and Cool-down Performed as group-led instruction    Resistance Training Performed Yes    VAD Patient? No    PAD/SET Patient? No      Pain Assessment   Currently in Pain? No/denies             Capillary Blood Glucose: No results found for this or any previous visit (from the past 24 hour(s)).    Social History   Tobacco Use  Smoking Status Former   Packs/day: 2.00   Years: 42.00   Total pack years: 84.00   Types: Cigarettes   Start date: 12/18/1954   Quit date: 07/12/2002   Years since quitting: 20.6  Smokeless Tobacco Never  Tobacco Comments   Quit smoking for about 5 years - 1974-1979    Goals Met:  No report of concerns or symptoms today Strength training completed today  Goals Unmet:  Not Applicable  Comments: Service time is from 1010 to 1142.    Dr. Rodman Pickle is Medical Director for Pulmonary Rehab at [facility].

## 2023-02-22 ENCOUNTER — Ambulatory Visit (HOSPITAL_COMMUNITY): Payer: Medicare Other

## 2023-02-22 ENCOUNTER — Encounter (HOSPITAL_COMMUNITY)
Admission: RE | Admit: 2023-02-22 | Discharge: 2023-02-22 | Disposition: A | Discharge status: Home / Self Care | Payer: Medicare Other | Source: Ambulatory Visit | Attending: Pulmonary Disease | Admitting: Pulmonary Disease

## 2023-02-22 VITALS — Wt 118.4 lb

## 2023-02-22 DIAGNOSIS — Z5189 Encounter for other specified aftercare: Secondary | ICD-10-CM | POA: Diagnosis not present

## 2023-02-22 DIAGNOSIS — J449 Chronic obstructive pulmonary disease, unspecified: Secondary | ICD-10-CM

## 2023-02-22 NOTE — Progress Notes (Signed)
Daily Session Note  Patient Details  Name: Virginia [name] MRN: [id number] Date of Birth: 1941/04/14 Referring Provider:   April Manson Pulmonary Rehab Walk Test from 02/14/2023 in [facility], [facility], & [facility]  Referring Provider Dewald       Encounter Date: 02/22/2023  Check In:  Session Check In - 02/22/23 1135       Check-In   Supervising physician immediately available to respond to emergencies [facility] MD immediately available    Physician(s) Dr Vaughan Browner    Location [facility]    Staff Present Janine Ores, RN, BSN;Randi Olen Cordial BS, ACSM-CEP, Exercise Physiologist;Kaylee Rosana Hoes, MS, ACSM-CEP, Exercise Physiologist;Other;Samantha [place], RD, LDN    Virtual Visit No    Medication changes reported     No    Fall or balance concerns reported    Yes    Tobacco Cessation No Change    Warm-up and Cool-down Performed as group-led instruction    Resistance Training Performed Yes    VAD Patient? No    PAD/SET Patient? No      Pain Assessment   Currently in Pain? No/denies    Multiple Pain Sites No             Capillary Blood Glucose: No results found for this or any previous visit (from the past 24 hour(s)).   Exercise Prescription Changes - 02/22/23 1200       Response to Exercise   Blood Pressure (Admit) 140/74    Blood Pressure (Exercise) 180/84    Blood Pressure (Exit) 132/70    Heart Rate (Admit) 102 bpm    Heart Rate (Exercise) 119 bpm    Heart Rate (Exit) 115 bpm    Oxygen Saturation (Admit) 92 %    Oxygen Saturation (Exercise) 92 %    Oxygen Saturation (Exit) 98 %    Rating of Perceived Exertion (Exercise) 13    Perceived Dyspnea (Exercise) 2    Duration Continue with 30 min of aerobic exercise without signs/symptoms of physical distress.    Intensity THRR unchanged      Progression   Progression Continue to progress workloads to maintain intensity without signs/symptoms of physical distress.    Average  METs 2.5      Resistance Training   Training Prescription Yes    Weight red bands    Reps 10-15    Time 10 Minutes      Oxygen   Oxygen Continuous    Liters 4      Recumbant Bike   Level 1    RPM 60    Minutes 15    METs 2.5      NuStep   Level 1    SPM 60    Minutes 15    METs 2.2      Oxygen   Maintain Oxygen Saturation 88% or higher             Social History   Tobacco Use  Smoking Status Former   Packs/day: 2.00   Years: 42.00   Total pack years: 84.00   Types: Cigarettes   Start date: 12/18/1954   Quit date: 07/12/2002   Years since quitting: 20.6  Smokeless Tobacco Never  Tobacco Comments   Quit smoking for about 5 years - 1974-1979    Goals Met:  Proper associated with RPD/PD & O2 Sat Independence with exercise equipment Exercise tolerated well No report of concerns or symptoms today Strength training completed today  Goals  Unmet:  Not Applicable  Comments: Service time is from 1010 to 1140.    Dr. Rodman Pickle is Medical Director for Pulmonary Rehab at [facility].

## 2023-02-27 ENCOUNTER — Encounter (HOSPITAL_COMMUNITY)
Admission: RE | Admit: 2023-02-27 | Discharge: 2023-02-27 | Disposition: A | Discharge status: Home / Self Care | Payer: Medicare Other | Source: Ambulatory Visit | Attending: Pulmonary Disease | Admitting: Pulmonary Disease

## 2023-02-27 ENCOUNTER — Ambulatory Visit (HOSPITAL_COMMUNITY): Payer: Medicare Other

## 2023-02-27 DIAGNOSIS — J449 Chronic obstructive pulmonary disease, unspecified: Secondary | ICD-10-CM

## 2023-02-27 DIAGNOSIS — Z5189 Encounter for other specified aftercare: Secondary | ICD-10-CM | POA: Diagnosis not present

## 2023-02-27 NOTE — Progress Notes (Signed)
Daily Session Note  Patient Details  Name: Virginia [name] MRN: [id number] Date of Birth: 05/12/1941 Referring Provider:   April Manson Pulmonary Rehab Walk Test from 02/14/2023 in [facility] for [facility], Vascular, & [place]  Referring Provider Dewald       Encounter Date: 02/27/2023  Check In:  Session Check In - 02/27/23 1134       Check-In   Supervising physician immediately available to respond to emergencies [facility] - Physician supervision    Physician(s) Barbaraann Barthel, NP    Location [facility]    Staff Present Janine Ores, RN, BSN;Randi Olen Cordial BS, ACSM-CEP, Exercise Physiologist;Kaylee Rosana Hoes, MS, ACSM-CEP, Exercise Physiologist;Other;Samantha [place], RD, LDN    Virtual Visit No    Medication changes reported     No    Fall or balance concerns reported    No    Tobacco Cessation No Change    Warm-up and Cool-down Performed as group-led instruction    Resistance Training Performed Yes    VAD Patient? No    PAD/SET Patient? No      Pain Assessment   Currently in Pain? No/denies    Multiple Pain Sites No             Capillary Blood Glucose: No results found for this or any previous visit (from the past 24 hour(s)).    Social History   Tobacco Use  Smoking Status Former   Packs/day: 2.00   Years: 42.00   Total pack years: 84.00   Types: Cigarettes   Start date: 12/18/1954   Quit date: 07/12/2002   Years since quitting: 20.6  Smokeless Tobacco Never  Tobacco Comments   Quit smoking for about 5 years - 1974-1979    Goals Met:  Proper associated with RPD/PD & O2 Sat Independence with exercise equipment Exercise tolerated well No report of concerns or symptoms today Strength training completed today  Goals Unmet:  Not Applicable  Comments: Service time is from 1007 to 1144.    Dr. Rodman Pickle is Medical Director for Pulmonary Rehab at [facility].

## 2023-02-28 NOTE — Progress Notes (Signed)
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 System? Yes      Barriers   Psychosocial barriers to participate in program There are no identifiable  barriers or psychosocial needs.      Screening Interventions   Interventions Encouraged to exercise    Expected Outcomes --             Quality of Life Scores:  Scores of 19 and below usually indicate a poorer quality of life in these areas.  A difference of  2-3 points is a clinically meaningful difference.  A difference of 2-3 points in the total score of the Quality of Life Index has been associated with significant improvement in overall quality of life, self-image, physical symptoms, and general health in studies assessing change in quality of life.  PHQ-9: Review  Flowsheet       02/14/2023 10/08/2015  Depression screen PHQ 2/9  Decreased Interest 0 0  Down, Depressed, Hopeless 0 0  PHQ - 2 Score 0 0  Altered sleeping 0 -  Tired, decreased energy 1 -  Change in appetite 0 -  Feeling bad or failure about yourself  0 -  Trouble concentrating 0 -  Moving slowly or fidgety/restless 0 -  Suicidal thoughts 0 -  PHQ-9 Score 1 -  Difficult doing work/chores Somewhat difficult -   Interpretation of Total Score  Total Score Depression Severity:  1-4 = Minimal depression, 5-9 = Mild depression, 10-14 = Moderate depression, 15-19 = Moderately severe depression, 20-27 = Severe depression   Psychosocial Evaluation and Intervention:  Psychosocial Evaluation - 02/14/23 1431       Psychosocial Evaluation & Interventions   Interventions Encouraged to exercise with the program and follow exercise prescription    Comments Fraser Din denies any psychsocial issues             Psychosocial Re-Evaluation:  Psychosocial Re-Evaluation     [place] Name 02/21/23 1315             Psychosocial Re-Evaluation   Current issues with None Identified       Comments Fraser Din denies any psychosocial barriers or concerns at this time.       Expected Outcomes For Fraser Din to participate in PR free of psychosocial concerns       Interventions Encouraged to attend Pulmonary Rehabilitation for the exercise       Continue Psychosocial Services  No Follow up required                Psychosocial Discharge (Final Psychosocial Re-Evaluation):  Psychosocial Re-Evaluation - 02/21/23 1315       Psychosocial Re-Evaluation   Current issues with None Identified    Comments Fraser Din denies any psychosocial barriers or concerns at this time.    Expected Outcomes For Pat to participate in PR free of psychosocial concerns    Interventions Encouraged to attend Pulmonary Rehabilitation for the exercise    Continue Psychosocial Services  No Follow up required              Education: Education Goals: Education classes will be provided on a weekly basis, covering required topics. Participant will state understanding/return demonstration of topics presented.  Learning Barriers/Preferences:  Learning Barriers/Preferences - 02/14/23 1431       Learning Barriers/Preferences   Learning Barriers None    Learning Preferences Written Material             Education Topics: Introduction to Pulmonary Rehab Group instruction provided by PowerPoint, verbal discussion, and written material to support subject matter. Instructor reviews what Pulmonary Rehab is, the purpose of the program, and how patients are referred.     Know Your Numbers  Group instruction that is supported by a PowerPoint presentation. Instructor discusses importance of knowing and understanding resting, exercise, and post-exercise oxygen saturation, heart rate, and blood pressure. Oxygen saturation, heart rate, blood pressure, rating of perceived exertion, and dyspnea are reviewed along with a normal range for these values.    Exercise for the Pulmonary Patient Group instruction that is supported by a PowerPoint presentation. Instructor discusses benefits of exercise, core components of exercise, frequency, duration, and intensity of an exercise routine, importance of utilizing pulse oximetry during exercise, safety while exercising, and options of places to exercise outside of rehab.       MET Level  Group instruction provided by PowerPoint, verbal discussion, and written material to support subject matter. Instructor reviews what METs are and how to increase METs.  Flowsheet Row PULMONARY REHAB CHRONIC OBSTRUCTIVE PULMONARY DISEASE from 02/20/2023 in [facility] for Heart, Vascular, & [place]  Date 02/20/23  Educator EP  Instruction Review Code 1- Verbalizes Understanding       Pulmonary Medications Verbally interactive group education provided by  instructor with focus on inhaled medications and proper administration.   Anatomy and Physiology of the Respiratory System Group instruction provided by PowerPoint, verbal discussion, and written material to support subject matter. Instructor reviews respiratory cycle and anatomical components of the respiratory system and their functions. Instructor also reviews differences in obstructive and restrictive respiratory diseases with examples of each.  Flowsheet Row PULMONARY REHAB CHRONIC OBSTRUCTIVE PULMONARY DISEASE from 02/22/2023 in [facility] for Heart, Vascular, & Lung Health  Date 02/22/23  Educator EP  Instruction Review Code 1- Verbalizes Understanding       Oxygen Safety Group instruction provided by PowerPoint, verbal discussion, and written material to support subject matter. There is an overview of "What is Oxygen" and "Why do we need it".  Instructor also reviews how to create a safe environment for oxygen use, the importance of using oxygen as prescribed, and the risks of noncompliance. There is a brief discussion on traveling with oxygen and resources the patient may utilize.   Oxygen Use Group instruction provided by PowerPoint, verbal discussion, and written material to discuss how supplemental oxygen is prescribed and different types of oxygen supply systems. Resources for more information are provided.    Breathing Techniques Group instruction that is supported by demonstration and informational handouts. Instructor discusses the benefits of pursed lip and diaphragmatic breathing and detailed demonstration on how to perform both.     Risk Factor Reduction Group instruction that is supported by a PowerPoint presentation. Instructor discusses the definition of a risk factor, different risk factors for pulmonary disease, and how the heart and lungs work together.   MD Day A group question and answer session with a medical doctor that allows  participants to ask questions that relate to their pulmonary disease state.   Nutrition for the Pulmonary Patient Group instruction provided by PowerPoint slides, verbal discussion, and written materials to support subject matter. The instructor gives an explanation and review of healthy diet recommendations, which includes a discussion on weight management, recommendations for fruit and vegetable consumption, as well as protein, fluid, caffeine, fiber, sodium, sugar, and alcohol. Tips for eating when patients are short of breath are discussed.    Other Education Group or individual verbal, written, or video instructions that support the educational goals of the pulmonary rehab program.    Knowledge Questionnaire Score:  Knowledge Questionnaire Score - 02/14/23 1447  Knowledge Questionnaire Score   Pre Score 12/18             Core Components/Risk Factors/Patient Goals at Admission:  Personal Goals and Risk Factors at Admission - 02/14/23 1348       Core Components/Risk Factors/Patient Goals on Admission    Weight Management Weight Maintenance    Improve shortness of breath with ADL's Yes    Intervention Provide education, individualized exercise plan and daily activity instruction to help decrease symptoms of SOB with activities of daily living.    Expected Outcomes Short Term: Improve cardiorespiratory fitness to achieve a reduction of symptoms when performing ADLs;Long Term: Be able to perform more ADLs without symptoms or delay the onset of symptoms    Increase knowledge of respiratory medications and ability to use respiratory devices properly  Yes    Intervention Provide education and demonstration as needed of appropriate use of medications, inhalers, and oxygen therapy.    Expected Outcomes Short Term: Achieves understanding of medications use. Understands that oxygen is a medication prescribed by physician. Demonstrates appropriate use of inhaler and oxygen  therapy.;Long Term: Maintain appropriate use of medications, inhalers, and oxygen therapy.             Core Components/Risk Factors/Patient Goals Review:   Goals and Risk Factor Review     Row Name 02/21/23 1316             Core Components/Risk Factors/Patient Goals Review   Personal Goals Review Develop more efficient breathing techniques such as purse lipped breathing and diaphragmatic breathing and practicing self-pacing with activity.;Increase knowledge of respiratory medications and ability to use respiratory devices properly.;Improve shortness of breath with ADL's       Review Fraser Din has attended 1 class so far. She is currently exercising on the recumbent bike and the Nustep. Fraser Din is practicing pursed lip breathing. We will continue to assess any progress pat makes throughout the program.       Expected Outcomes See admission goals                Core Components/Risk Factors/Patient Goals at Discharge (Final Review):   Goals and Risk Factor Review - 02/21/23 1316       Core Components/Risk Factors/Patient Goals Review   Personal Goals Review Develop more efficient breathing techniques such as purse lipped breathing and diaphragmatic breathing and practicing self-pacing with activity.;Increase knowledge of respiratory medications and ability to use respiratory devices properly.;Improve shortness of breath with ADL's    Review Fraser Din has attended 1 class so far. She is currently exercising on the recumbent bike and the Nustep. Fraser Din is practicing pursed lip breathing. We will continue to assess any progress pat makes throughout the program.    Expected Outcomes See admission goals             ITP Comments:   Comments: Dr. Rodman Pickle is Medical Director for Pulmonary Rehab at [facility].

## 2023-03-01 ENCOUNTER — Ambulatory Visit (HOSPITAL_COMMUNITY): Payer: Medicare Other

## 2023-03-01 ENCOUNTER — Encounter (HOSPITAL_COMMUNITY)
Admission: RE | Admit: 2023-03-01 | Discharge: 2023-03-01 | Disposition: A | Discharge status: Home / Self Care | Payer: Medicare Other | Source: Ambulatory Visit | Attending: Pulmonary Disease | Admitting: Pulmonary Disease

## 2023-03-01 DIAGNOSIS — J449 Chronic obstructive pulmonary disease, unspecified: Secondary | ICD-10-CM

## 2023-03-01 DIAGNOSIS — Z5189 Encounter for other specified aftercare: Secondary | ICD-10-CM | POA: Diagnosis not present

## 2023-03-01 NOTE — Progress Notes (Signed)
Orders received from Dr. Erin Fulling to increase pt's target heart rate to 135 in Pulmonary Rehab.

## 2023-03-01 NOTE — Progress Notes (Signed)
Daily Session Note  Patient Details  Name: Virginia [name] MRN: [id number] Date of Birth: 09/14/1941 Referring Provider:   April Manson Pulmonary Rehab Walk Test from 02/14/2023 in [facility] for Heart, Vascular, & Lung Health  Referring Provider Dewald       Encounter Date: 03/01/2023  Check In:  Session Check In - 03/01/23 1158       Check-In   Supervising physician immediately available to respond to emergencies [facility] MD immediately available    Physician(s) Dr Gala Romney    Location [facility]    Staff Present Janine Ores, RN, BSN;Randi Olen Cordial BS, ACSM-CEP, Exercise Physiologist;Kaylee Rosana Hoes, MS, ACSM-CEP, Exercise Physiologist;Other;Samantha [place], RD, LDN    Virtual Visit No    Medication changes reported     No    Fall or balance concerns reported    No    Tobacco Cessation No Change    Warm-up and Cool-down Performed as group-led instruction    Resistance Training Performed Yes    VAD Patient? No    PAD/SET Patient? No      Pain Assessment   Currently in Pain? No/denies    Multiple Pain Sites No             Capillary Blood Glucose: No results found for this or any previous visit (from the past 24 hour(s)).    Social History   Tobacco Use  Smoking Status Former   Packs/day: 2.00   Years: 42.00   Additional pack years: 0.00   Total pack years: 84.00   Types: Cigarettes   Start date: 12/18/1954   Quit date: 07/12/2002   Years since quitting: 20.6  Smokeless Tobacco Never  Tobacco Comments   Quit smoking for about 5 years - 1974-1979    Goals Met:  Improved SOB with ADL's No report of concerns or symptoms today Strength training completed today  Goals Unmet:  Pt had to turn oxygen up to  3L  Comments: Service time is from 1004 to [place]    Dr. Rodman Pickle is Medical Director for Pulmonary Rehab at [facility].

## 2023-03-06 ENCOUNTER — Ambulatory Visit (HOSPITAL_COMMUNITY): Payer: Medicare Other

## 2023-03-06 ENCOUNTER — Encounter (HOSPITAL_COMMUNITY)
Admission: RE | Admit: 2023-03-06 | Discharge: 2023-03-06 | Disposition: A | Discharge status: Home / Self Care | Payer: Medicare Other | Source: Ambulatory Visit | Attending: Pulmonary Disease | Admitting: Pulmonary Disease

## 2023-03-06 VITALS — Wt 117.9 lb

## 2023-03-06 DIAGNOSIS — J449 Chronic obstructive pulmonary disease, unspecified: Secondary | ICD-10-CM

## 2023-03-06 DIAGNOSIS — Z5189 Encounter for other specified aftercare: Secondary | ICD-10-CM | POA: Diagnosis not present

## 2023-03-06 NOTE — Progress Notes (Signed)
Daily Session Note  Patient Details  Name: Virginia [name] MRN: [id number] Date of Birth: 12-15-1941 Referring Provider:   April Manson Pulmonary Rehab Walk Test from 02/14/2023 in [facility] for Heart, Vascular, & [place]  Referring Provider Dewald       Encounter Date: 03/06/2023  Check In:  Session Check In - 03/06/23 1207       Check-In   Supervising physician immediately available to respond to emergencies [facility] MD immediately available    Physician(s) Eric Form, NP    Location [facility]    Staff Present Janine Ores, RN, BSN;Carlette Wilber Oliphant, RN, Quentin Ore, MS, ACSM-CEP, Exercise Physiologist;Samantha [place], RD, LDN;Other    Virtual Visit No    Medication changes reported     No    Fall or balance concerns reported    No    Tobacco Cessation No Change    Warm-up and Cool-down Performed as group-led instruction    Resistance Training Performed Yes    VAD Patient? No    PAD/SET Patient? No      Pain Assessment   Currently in Pain? Yes    Pain Score 6     Pain Location Hip    Pain Orientation Mid    Pain Descriptors / Indicators Throbbing    Pain Type Acute pain    Pain Onset Today    Pain Frequency Intermittent    Multiple Pain Sites No             Capillary Blood Glucose: No results found for this or any previous visit (from the past 24 hour(s)).   Exercise Prescription Changes - 03/06/23 1200       Response to Exercise   Blood Pressure (Admit) 154/60    Blood Pressure (Exercise) 122/64    Blood Pressure (Exit) 142/66    Heart Rate (Admit) 107 bpm    Heart Rate (Exercise) 120 bpm    Heart Rate (Exit) 98 bpm    Oxygen Saturation (Admit) 94 %    Oxygen Saturation (Exercise) 91 %    Oxygen Saturation (Exit) 98 %    Rating of Perceived Exertion (Exercise) 9    Perceived Dyspnea (Exercise) 1    Duration Continue with 30 min of aerobic exercise without signs/symptoms of physical distress.     Intensity THRR unchanged      Progression   Progression Continue to progress workloads to maintain intensity without signs/symptoms of physical distress.      Resistance Training   Training Prescription Yes    Weight red bands    Reps 10-15    Time 10 Minutes      Oxygen   Oxygen Continuous    Liters 4      Track   Laps 8    Minutes 15    METs 2.23      Oxygen   Maintain Oxygen Saturation 88% or higher             Social History   Tobacco Use  Smoking Status Former   Packs/day: 2.00   Years: 42.00   Additional pack years: 0.00   Total pack years: 84.00   Types: Cigarettes   Start date: 12/18/1954   Quit date: 07/12/2002   Years since quitting: 20.6  Smokeless Tobacco Never  Tobacco Comments   Quit smoking for about 5 years - 1974-1979    Goals Met:  Proper associated with RPD/PD & O2 Sat Independence with exercise equipment Exercise tolerated  well No report of concerns or symptoms today Strength training completed today  Goals Unmet:  Not Applicable  Comments: Service time is from 1019 to 1140.    Dr. Rodman Pickle is Medical Director for Pulmonary Rehab at [facility]

## 2023-03-08 ENCOUNTER — Ambulatory Visit (HOSPITAL_COMMUNITY): Payer: Medicare Other

## 2023-03-08 ENCOUNTER — Encounter (HOSPITAL_COMMUNITY)
Admission: RE | Admit: 2023-03-08 | Discharge: 2023-03-08 | Disposition: A | Discharge status: Home / Self Care | Payer: Medicare Other | Source: Ambulatory Visit | Attending: Pulmonary Disease | Admitting: Pulmonary Disease

## 2023-03-08 DIAGNOSIS — J449 Chronic obstructive pulmonary disease, unspecified: Secondary | ICD-10-CM

## 2023-03-08 LAB — LAB REPORT - SCANNED
A1c: 6.8
Creatinine, POC: 28.3 mg/dL
EGFR: 71

## 2023-03-08 NOTE — Progress Notes (Signed)
Daily Session Note  Patient Details  Name: Virginia [name] MRN: [id number] Date of Birth: 1941-02-19 Referring Provider:   April Manson Pulmonary Rehab Walk Test from 02/14/2023 in [facility] for Heart, Vascular, & Lung Health  Referring Provider Dewald       Encounter Date: 03/08/2023  Check In:  Session Check In - 03/08/23 1536       Check-In   Supervising physician immediately available to respond to emergencies [facility] MD immediately available    Physician(s) Dr Marlyce Huge    [facility]    Staff Present Janine Ores, RN, Quentin Ore, MS, ACSM-CEP, Exercise Physiologist;Samantha [place], RD, LDN;Other    Virtual Visit No    Medication changes reported     No    Fall or balance concerns reported    No    Tobacco Cessation No Change    Warm-up and Cool-down Performed as group-led instruction    Resistance Training Performed Yes    VAD Patient? No    PAD/SET Patient? No      Pain Assessment   Currently in Pain? No/denies             Capillary Blood Glucose: No results found for this or any previous visit (from the past 24 hour(s)).    Social History   Tobacco Use  Smoking Status Former   Packs/day: 2.00   Years: 42.00   Additional pack years: 0.00   Total pack years: 84.00   Types: Cigarettes   Start date: 12/18/1954   Quit date: 07/12/2002   Years since quitting: 20.6  Smokeless Tobacco Never  Tobacco Comments   Quit smoking for about 5 years - 1974-1979    Goals Met:  Proper associated with RPD/PD & O2 Sat Independence with exercise equipment Exercise tolerated well No report of concerns or symptoms today Strength training completed today  Goals Unmet:  Not Applicable  Comments: Service time is from 1310 to 1445.    Dr. Rodman Pickle is Medical Director for Pulmonary Rehab at [facility].

## 2023-03-13 ENCOUNTER — Ambulatory Visit (HOSPITAL_COMMUNITY): Payer: Medicare Other

## 2023-03-13 ENCOUNTER — Encounter (HOSPITAL_COMMUNITY)
Admission: RE | Admit: 2023-03-13 | Discharge: 2023-03-13 | Disposition: A | Discharge status: Home / Self Care | Payer: Medicare Other | Source: Ambulatory Visit | Attending: Pulmonary Disease | Admitting: Pulmonary Disease

## 2023-03-13 DIAGNOSIS — Z5189 Encounter for other specified aftercare: Secondary | ICD-10-CM | POA: Diagnosis not present

## 2023-03-13 DIAGNOSIS — J449 Chronic obstructive pulmonary disease, unspecified: Secondary | ICD-10-CM

## 2023-03-13 NOTE — Progress Notes (Signed)
Daily Session Note  Patient Details  Name: Virginia [name] MRN: [id number] Date of Birth: [birth year] Referring Provider:   April Manson Pulmonary Rehab Walk Test from 02/14/2023 in [facility] for Heart, Vascular, & Lung Health  Referring Provider Dewald       Encounter Date: 03/13/2023  Check In:  Session Check In - 03/13/23 1214       Check-In   Supervising physician immediately available to respond to emergencies [facility] MD immediately available    Physician(s) [place] Crane, PA    [facility]    Staff Present Janine Ores, RN, Quentin Ore, MS, ACSM-CEP, Exercise Physiologist;Samantha [place], RD, LDN;Casey Tamala Julian, RT;Randi Reeve BS, ACSM-CEP, Exercise Physiologist    Virtual Visit No    Medication changes reported     No    Fall or balance concerns reported    No    Tobacco Cessation No Change    Warm-up and Cool-down Performed as group-led instruction    Resistance Training Performed Yes    VAD Patient? No    PAD/SET Patient? No      Pain Assessment   Currently in Pain? No/denies    Pain Score 0-No pain    Multiple Pain Sites No             Capillary Blood Glucose: No results found for this or any previous visit (from the past 24 hour(s)).    Social History   Tobacco Use  Smoking Status Former   Packs/day: 2.00   Years: 42.00   Additional pack years: 0.00   Total pack years: 84.00   Types: Cigarettes   Start date: 12/18/1954   Quit date: 07/12/2002   Years since quitting: 20.6  Smokeless Tobacco Never  Tobacco Comments   Quit smoking for about 5 years - 1974-1979    Goals Met:  Proper associated with RPD/PD & O2 Sat Exercise tolerated well No report of concerns or symptoms today Strength training completed today  Goals Unmet:  Not Applicable  Comments: Service time is from 1024 to 1150.    Dr. Rodman Pickle is Medical Director for Pulmonary Rehab at [facility].

## 2023-03-15 ENCOUNTER — Ambulatory Visit (HOSPITAL_COMMUNITY): Payer: Medicare Other

## 2023-03-15 ENCOUNTER — Encounter (HOSPITAL_COMMUNITY)
Admission: RE | Admit: 2023-03-15 | Discharge: 2023-03-15 | Disposition: A | Discharge status: Home / Self Care | Payer: Medicare Other | Source: Ambulatory Visit | Attending: Pulmonary Disease | Admitting: Pulmonary Disease

## 2023-03-15 DIAGNOSIS — Z5189 Encounter for other specified aftercare: Secondary | ICD-10-CM | POA: Diagnosis not present

## 2023-03-15 DIAGNOSIS — J449 Chronic obstructive pulmonary disease, unspecified: Secondary | ICD-10-CM

## 2023-03-15 NOTE — Progress Notes (Signed)
Daily Session Note  Patient Details  Name: Virginia [name] MRN: [id number] Date of Birth: Mar 16, 1941 Referring Provider:   April Manson Pulmonary Rehab Walk Test from 02/14/2023 in [facility] for Heart, Vascular, & [place]  Referring Provider Dewald       Encounter Date: 03/15/2023  Check In:  Session Check In - 03/15/23 1144       Check-In   Supervising physician immediately available to respond to emergencies [facility] MD immediately available    Physician(s) [place]    [facility]    Staff Present Janine Ores, RN, Quentin Ore, MS, ACSM-CEP, Exercise Physiologist;Samantha [place], RD, LDN;Nachmen Mansel Tamala Julian, RT;Randi Reeve BS, ACSM-CEP, Exercise Physiologist;Bailey Pearline Cables, MS, Exercise Physiologist;Johnny Starleen Blue, MS, Exercise Physiologist    Virtual Visit No    Medication changes reported     No    Fall or balance concerns reported    No    Comments pt uses a cane    Tobacco Cessation No Change    Warm-up and Cool-down Performed as group-led instruction    Resistance Training Performed Yes    VAD Patient? No    PAD/SET Patient? No      Pain Assessment   Currently in Pain? No/denies             Capillary Blood Glucose: No results found for this or any previous visit (from the past 24 hour(s)).    Social History   Tobacco Use  Smoking Status Former   Packs/day: 2.00   Years: 42.00   Additional pack years: 0.00   Total pack years: 84.00   Types: Cigarettes   Start date: 12/18/1954   Quit date: 07/12/2002   Years since quitting: 20.6  Smokeless Tobacco Never  Tobacco Comments   Quit smoking for about 5 years - 1974-1979    Goals Met:  Proper associated with RPD/PD & O2 Sat Independence with exercise equipment Exercise tolerated well No report of concerns or symptoms today Strength training completed today  Goals Unmet:  Not Applicable  Comments: Service time is from 1012 to 1140.    Dr. Rodman Pickle is  Medical Director for Pulmonary Rehab at [facility].

## 2023-03-16 ENCOUNTER — Telehealth (HOSPITAL_COMMUNITY): Payer: Self-pay

## 2023-03-16 ENCOUNTER — Telehealth: Payer: Self-pay | Admitting: Pulmonary Disease

## 2023-03-16 DIAGNOSIS — J441 Chronic obstructive pulmonary disease with (acute) exacerbation: Secondary | ICD-10-CM

## 2023-03-16 NOTE — Telephone Encounter (Addendum)
LVM about Pulm appointment and needing home O2, awaiting call back  Pat returned call. Very upset that O2 was ordered. Virginia [name] stated that her initial walk test wasn't accurate because she had a URI. I explained to Virginia [name] that she has needed O2 every time she has attended [employer]. Virginia [name] stated that yesterday she was in severe pain and when she's in pain she doesn't breathe.   Made patient appt with Dr. Erin Fulling for follow up so he can discuss with her the need for oxygen.

## 2023-03-16 NOTE — Telephone Encounter (Signed)
Pt. Had 6 min walk [facility] and pt.needed to be on 4L of O2 [facility] asking if a order could be placed for oxygen and made her a follow up with Dr., Erin Fulling

## 2023-03-16 NOTE — Telephone Encounter (Signed)
Sir,  Are you ok if I place an order for oxygen based on the 6 minute walk test done at Pulmonary rehab. They are stating that the patient is needing 4L continuous.   Are you ok with this?  Or do you want patient to come in the office?  Please advise

## 2023-03-19 ENCOUNTER — Other Ambulatory Visit: Payer: Self-pay | Admitting: Pulmonary Disease

## 2023-03-19 ENCOUNTER — Telehealth: Payer: Self-pay | Admitting: Pulmonary Disease

## 2023-03-19 DIAGNOSIS — J449 Chronic obstructive pulmonary disease, unspecified: Secondary | ICD-10-CM

## 2023-03-19 NOTE — Telephone Encounter (Signed)
Order placed for oxygen. Nothing furhter needed

## 2023-03-19 NOTE — Telephone Encounter (Signed)
Ok to place order for supplemental oxygen 4L continuous with exertion.  Please also order her for a portable oxygen concentrator as well. She will need to be evaluated by the oxygen company for the setting on the pulsed POC.  Thanks, JD

## 2023-03-19 NOTE — Telephone Encounter (Signed)
Please put walk test results in Epic for this PT. [employer] is requesting this in order to fill a O2 RX. Thanks.

## 2023-03-20 ENCOUNTER — Encounter (HOSPITAL_COMMUNITY)
Admission: RE | Admit: 2023-03-20 | Discharge: 2023-03-20 | Disposition: A | Discharge status: Home / Self Care | Payer: Medicare Other | Source: Ambulatory Visit | Attending: Pulmonary Disease | Admitting: Pulmonary Disease

## 2023-03-20 ENCOUNTER — Ambulatory Visit (HOSPITAL_COMMUNITY): Payer: Medicare Other

## 2023-03-20 ENCOUNTER — Telehealth: Payer: Self-pay | Admitting: Pulmonary Disease

## 2023-03-20 VITALS — Wt 118.2 lb

## 2023-03-20 DIAGNOSIS — J449 Chronic obstructive pulmonary disease, unspecified: Secondary | ICD-10-CM | POA: Diagnosis present

## 2023-03-20 DIAGNOSIS — J439 Emphysema, unspecified: Secondary | ICD-10-CM

## 2023-03-20 NOTE — Telephone Encounter (Signed)
Updated snapshot with walk stats. Nothing further needed.

## 2023-03-20 NOTE — Telephone Encounter (Signed)
Pt states FLOVENT HFA is no longer being made and she needs a script sent in for another inhaler

## 2023-03-20 NOTE — Progress Notes (Signed)
Daily Session Note  Patient Details  Name: Virginia [name] MRN: [id number] Date of Birth: 06/15/1941 Referring Provider:   April Manson Pulmonary Rehab Walk Test from 02/14/2023 in [facility] for Heart, Vascular, & [place]  Referring Provider Dewald       Encounter Date: 03/20/2023  Check In:  Session Check In - 03/20/23 1120       Check-In   Supervising physician immediately available to respond to emergencies [facility] MD immediately available    Physician(s) Ambrose Pancoast, NP    Location [facility]    Staff Present Elmon Else, MS, ACSM-CEP, Exercise Physiologist;Casey Corliss Parish BS, ACSM-CEP, Exercise Physiologist;Samantha [place], RD, LDN;Carlette Wilber Oliphant, RN, BSN    Virtual Visit No    Medication changes reported     No    Fall or balance concerns reported    No    Comments pt uses a cane    Tobacco Cessation No Change    Warm-up and Cool-down Performed as group-led instruction    Resistance Training Performed Yes    VAD Patient? No    PAD/SET Patient? No      Pain Assessment   Currently in Pain? No/denies    Multiple Pain Sites No             Capillary Blood Glucose: No results found for this or any previous visit (from the past 24 hour(s)).   Exercise Prescription Changes - 03/20/23 1500       Response to Exercise   Blood Pressure (Admit) 114/78    Blood Pressure (Exercise) 138/80    Blood Pressure (Exit) 138/70    Heart Rate (Admit) 110 bpm    Heart Rate (Exercise) 135 bpm    Heart Rate (Exit) 109 bpm    Oxygen Saturation (Admit) 94 %    Oxygen Saturation (Exercise) 93 %    Oxygen Saturation (Exit) 91 %    Rating of Perceived Exertion (Exercise) 13    Perceived Dyspnea (Exercise) 2    Duration Continue with 30 min of aerobic exercise without signs/symptoms of physical distress.    Intensity THRR unchanged      Progression   Progression Continue to progress workloads to maintain intensity without  signs/symptoms of physical distress.      Resistance Training   Training Prescription Yes    Weight red bands    Reps 10-15    Time 10 Minutes      Oxygen   Oxygen Continuous    Liters 4      Recumbant Bike   Level 1.5    RPM 60    Minutes 15    METs 2.7      Track   Laps 10    Minutes 15    METs 2.54      Oxygen   Maintain Oxygen Saturation 88% or higher             Social History   Tobacco Use  Smoking Status Former   Packs/day: 2.00   Years: 42.00   Additional pack years: 0.00   Total pack years: 84.00   Types: Cigarettes   Start date: 12/18/1954   Quit date: 07/12/2002   Years since quitting: 20.7  Smokeless Tobacco Never  Tobacco Comments   Quit smoking for about 5 years - 1974-1979    Goals Met:  Independence with exercise equipment Exercise tolerated well No report of concerns or symptoms today Strength training completed today  Goals Unmet:  Not Applicable  Comments: Service time is from 1011 to 1145.    Dr. Rodman Pickle is Medical Director for Pulmonary Rehab at [facility].

## 2023-03-21 NOTE — Telephone Encounter (Signed)
Can we run a ticket on patients insurance to see what else is covered besides Flovent  Please route message back to triage

## 2023-03-22 ENCOUNTER — Ambulatory Visit (HOSPITAL_COMMUNITY): Payer: Medicare Other

## 2023-03-22 ENCOUNTER — Encounter (HOSPITAL_COMMUNITY)
Admission: RE | Admit: 2023-03-22 | Discharge: 2023-03-22 | Disposition: A | Discharge status: Home / Self Care | Payer: Medicare Other | Source: Ambulatory Visit | Attending: Pulmonary Disease | Admitting: Pulmonary Disease

## 2023-03-22 ENCOUNTER — Other Ambulatory Visit (HOSPITAL_COMMUNITY): Payer: Self-pay

## 2023-03-22 DIAGNOSIS — J449 Chronic obstructive pulmonary disease, unspecified: Secondary | ICD-10-CM

## 2023-03-22 NOTE — Progress Notes (Signed)
Daily Session Note  Patient Details  Name: Virginia [name] MRN: [id number] Date of Birth: July 26, 1941 Referring Provider:   April Manson Pulmonary Rehab Walk Test from 02/14/2023 in [facility] for Heart, Vascular, & [place]  Referring Provider Dewald       Encounter Date: 03/22/2023  Check In:  Session Check In - 03/22/23 1105       Check-In   Supervising physician immediately available to respond to emergencies [facility] MD immediately available    Physician(s) Odie Sera, PA    Location [facility]    Staff Present Janine Ores, RN, BSN;Randi Olen Cordial BS, ACSM-CEP, Exercise Physiologist;Jetta Gilford Rile BS, ACSM-CEP, Exercise Physiologist;Kaylee Rosana Hoes, MS, ACSM-CEP, Exercise Physiologist;Marque Rademaker Tamala Julian, RT    Virtual Visit No    Medication changes reported     Yes    Comments d/c flovent    Fall or balance concerns reported    No    Tobacco Cessation No Change    Warm-up and Cool-down Performed as group-led instruction    Resistance Training Performed Yes    VAD Patient? No    PAD/SET Patient? No      Pain Assessment   Currently in Pain? No/denies    Multiple Pain Sites No             Capillary Blood Glucose: No results found for this or any previous visit (from the past 24 hour(s)).    Social History   Tobacco Use  Smoking Status Former   Packs/day: 2.00   Years: 42.00   Additional pack years: 0.00   Total pack years: 84.00   Types: Cigarettes   Start date: 12/18/1954   Quit date: 07/12/2002   Years since quitting: 20.7  Smokeless Tobacco Never  Tobacco Comments   Quit smoking for about 5 years - 1974-1979    Goals Met:  Proper associated with RPD/PD & O2 Sat Independence with exercise equipment Exercise tolerated well No report of concerns or symptoms today Strength training completed today  Goals Unmet:  Not Applicable  Comments: Service time is from 1010 to 1140.    Dr. Rodman Pickle is Medical Director  for Pulmonary Rehab at [facility].

## 2023-03-22 NOTE — Telephone Encounter (Signed)
Dr. Erin Fulling Patient is not able to get flovent anymore. Alternative provided from her insurance are Arnuity and Qvar. Please advise on which inhaler to send

## 2023-03-22 NOTE — Telephone Encounter (Signed)
Preferred alternatives covered by the patients plan at this time:   Arnuity- $47.00 Qvar- $47.00

## 2023-03-23 MED ORDER — BECLOMETHASONE DIPROP HFA 80 MCG/ACT IN AERB: 2.0000 | INHALATION_SPRAY | Freq: Two times a day (BID) | RESPIRATORY_TRACT | 6 refills | Status: DC

## 2023-03-23 NOTE — Telephone Encounter (Signed)
Pt called and said that she was checking to see which inhaler she could be switched to in place of the Flovent since it is no longer being made.  Pt said she is currently out of her inhaler. Routing this as high priority due to pt not having any inhaler at all.   Pt said to call her back at [phone].  **Please route to triage pool and not directly back to me**

## 2023-03-23 NOTE — Telephone Encounter (Signed)
Called and spoke with patient. She stated she is still using her Anoro. She verbalized understanding of the Qvar.   While on the phone, she wanted to check on the status of her oxygen order. I advised her that the order had been placed on Monday. She has not heard anything from [employer] yet. I advised her that I would call [employer] to check on the status of the order.   I called [employer] and spoke with Shelly. She stated that she could not see the order. She will reach out to the oxygen dept and have their rep to reach out to me.   Will leave this encounter open for follow up.

## 2023-03-23 NOTE — Telephone Encounter (Signed)
She is still using her Anoro, correct? Please verify this. I have sent in Qvar 2 puffs Twice daily for her to use in place of the Flovent. Clean mouth afterwards. Thanks.

## 2023-03-26 NOTE — Telephone Encounter (Signed)
I have sent a message to [employer] 

## 2023-03-26 NOTE — Telephone Encounter (Signed)
[employer] has the order 

## 2023-03-26 NOTE — Telephone Encounter (Signed)
Received a call from [employer] stating that they are still not able to see the order that was placed 4/1 for pt's oxygen. They are asking for this to be resubmitted.   Routing to [facility] for help.

## 2023-03-27 ENCOUNTER — Ambulatory Visit (HOSPITAL_COMMUNITY): Payer: Medicare Other

## 2023-03-27 ENCOUNTER — Encounter (HOSPITAL_COMMUNITY)
Admission: RE | Admit: 2023-03-27 | Discharge: 2023-03-27 | Disposition: A | Discharge status: Home / Self Care | Payer: Medicare Other | Source: Ambulatory Visit | Attending: Pulmonary Disease | Admitting: Pulmonary Disease

## 2023-03-27 DIAGNOSIS — J449 Chronic obstructive pulmonary disease, unspecified: Secondary | ICD-10-CM

## 2023-03-27 NOTE — Progress Notes (Signed)
Daily Session Note  Patient Details  Name: Virginia [name] MRN: [id number] Date of Birth: 06/14/1941 Referring Provider:   Doristine Devoid Pulmonary Rehab Walk Test from 02/14/2023 in [facility] for Heart, Vascular, & [facility]  Referring Provider Dewald       Encounter Date: 03/27/2023  Check In:  Session Check In - 03/27/23 1158       Check-In   Supervising physician immediately available to respond to emergencies [facility] MD immediately available    Physician(s) Oris Drone, PA    Location [facility]    Staff Present Essie Hart, RN, BSN;Randi Idelle Crouch BS, ACSM-CEP, Exercise Physiologist;Shekira Drummer Earlene Plater, MS, ACSM-CEP, Exercise Physiologist;Casey Katrinka Blazing, Pennelope Bracken, RN, BSN    Virtual Visit No    Medication changes reported     No    Comments started Qvar    Fall or balance concerns reported    No    Tobacco Cessation No Change    Warm-up and Cool-down Performed as group-led instruction    Resistance Training Performed Yes    VAD Patient? No    PAD/SET Patient? No      Pain Assessment   Currently in Pain? No/denies    Multiple Pain Sites No             Capillary Blood Glucose: No results found for this or any previous visit (from the past 24 hour(s)).    Social History   Tobacco Use  Smoking Status Former   Packs/day: 2.00   Years: 42.00   Additional pack years: 0.00   Total pack years: 84.00   Types: Cigarettes   Start date: 12/18/1954   Quit date: 07/12/2002   Years since quitting: 20.7  Smokeless Tobacco Never  Tobacco Comments   Quit smoking for about 5 years - 1974-1979    Goals Met:  Proper associated with RPD/PD & O2 Sat Exercise tolerated well No report of concerns or symptoms today Strength training completed today  Goals Unmet:  Not Applicable  Comments: Service time is from 1015 to 1134.    Dr. Mechele Collin is Medical Director for Pulmonary Rehab at [facility].

## 2023-03-28 ENCOUNTER — Telehealth: Payer: Self-pay | Admitting: Pulmonary Disease

## 2023-03-28 NOTE — Progress Notes (Signed)
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 breath with daily activities and feel confident in carrying out an exercise regimen at home.               Discharge Exercise Prescription (Final Exercise Prescription Changes):  Exercise Prescription Changes - 03/20/23 1500       Response to Exercise   Blood Pressure (Admit) 114/78    Blood Pressure (Exercise) 138/80    Blood Pressure (Exit) 138/70    Heart Rate (Admit) 110 bpm    Heart Rate (Exercise) 135 bpm    Heart Rate (Exit) 109 bpm    Oxygen Saturation (Admit) 94 %    Oxygen Saturation (Exercise) 93 %    Oxygen Saturation (Exit) 91 %    Rating of Perceived Exertion (Exercise) 13    Perceived Dyspnea (Exercise) 2    Duration Continue with 30 min of aerobic exercise without signs/symptoms of physical distress.    Intensity THRR unchanged      Progression    Progression Continue to progress workloads to maintain intensity without signs/symptoms of physical distress.      Resistance Training   Training Prescription Yes    Weight red bands    Reps 10-15    Time 10 Minutes      Oxygen   Oxygen Continuous    Liters 4      Recumbant Bike   Level 1.5    RPM 60    Minutes 15    METs 2.7      Track   Laps 10    Minutes 15    METs 2.54      Oxygen   Maintain Oxygen Saturation 88% or higher             Nutrition:  Target Goals: Understanding of nutrition guidelines, daily intake of sodium 1500mg , cholesterol 200mg , calories 30% from fat and 7% or less from saturated fats, daily to have 5 or more servings of fruits and vegetables.  Biometrics:  Pre Biometrics -  02/14/23 1453       Pre Biometrics   Grip Strength 18 kg              Nutrition Therapy Plan and Nutrition Goals:  Nutrition Therapy & Goals - 03/20/23 1031       Nutrition Therapy   Diet General Healthy Diet      Personal Nutrition Goals   Nutrition Goal Patient to improve diet quality by using the plate method as a daily guide for meal planning to include lean protein/plant protein, fruits, vegetables, whole grains, nonfat dairy as part of well balanced diet    Personal Goal #2 Patient to identify strategies for weight maintanence/weight gain of 0.5-2.0# per week.    Comments Goals have not been met. Virginia [name] report eating  2 meals daily and 1-2 snacks. She reports normal appetite and maintaining weight of ~124# over the last year. Her weight is down 2# since starting with our program. Reviewed strategies for weight gain and gave handouts.  Virginia [name] will benefit from participation in pulmonary rehab for nutrition, exercise, and lifestyle modification.      Intervention Plan   Intervention Prescribe, educate and counsel regarding individualized specific dietary modifications aiming towards targeted core components such as weight, hypertension, lipid management,  diabetes, heart failure and other comorbidities.;Nutrition handout(s) given to patient.    Expected Outcomes Short Term Goal: Understand basic principles of dietary content, such as calories, fat, sodium, cholesterol and nutrients.;Long Term Goal: Adherence to prescribed nutrition plan.             Nutrition Assessments:  MEDIFICTS Score Key: ?70 Need to make dietary changes  40-70 Heart Healthy Diet ? 40 Therapeutic Level Cholesterol Diet   Picture Your Plate Scores: <16 Unhealthy dietary pattern with much room for improvement. 41-50 Dietary pattern unlikely to meet recommendations for good health and room for improvement. 51-60 More healthful dietary pattern, with some room for improvement.  >60 Healthy dietary pattern, although there may be some specific behaviors that could be improved.    Nutrition Goals Re-Evaluation:  Nutrition Goals Re-Evaluation     Row Name 02/20/23 1139 03/20/23 1031           Goals   Current Weight 117 lb 15.1 oz (53.5 kg)  reweighed x3 118 lb 2.7 oz (53.6 kg)      Comment GFR 55.7, lipids WNL-HDL 79, A1c 6.5; She will follow-up with PCP 03/08/23 Most recent labs from PCP  03/08/23 not available in CareEverywhere; patient reports slightly elevated A1c      Expected Outcome Virginia [name] report eating 2 meals daily and 1-2 snacks. She reports normal appetite and maintaining weight of ~124# over the last year. Virginia [name] will benefit from participation in pulmonary rehab for nutrition, exercise, and lifestyle modification. Goals have not been met. Virginia [name] report eating 2 meals daily and 1-2 snacks. She reports normal appetite and maintaining weight of ~124# over the last year. Her weight is down 2# since starting with our program. Reviewed strategies for weight gain and gave handouts. Virginia [name] will benefit from participation in pulmonary rehab for nutrition, exercise, and lifestyle modification.               Nutrition Goals Discharge (Final Nutrition Goals Re-Evaluation):   Nutrition Goals Re-Evaluation - 03/20/23 1031       Goals   Current Weight 118 lb 2.7 oz (53.6 kg)    Comment Most recent labs from PCP  03/08/23 not available in CareEverywhere; patient reports slightly elevated A1c  Expected Outcome Goals have not been met. Virginia [name] report eating 2 meals daily and 1-2 snacks. She reports normal appetite and maintaining weight of ~124# over the last year. Her weight is down 2# since starting with our program. Reviewed strategies for weight gain and gave handouts. Virginia [name] will benefit from participation in pulmonary rehab for nutrition, exercise, and lifestyle modification.             Psychosocial: Target Goals: Acknowledge presence or absence of significant depression and/or stress, maximize coping skills, provide positive support system. Participant is able to verbalize types and ability to use techniques and skills needed for reducing stress and depression.  Initial Review & Psychosocial Screening:  Initial Psych Review & Screening - 02/14/23 1346       Initial Review   Current issues with None Identified      Family Dynamics   Good Support System? Yes      Barriers   Psychosocial barriers to participate in program There are no identifiable barriers or psychosocial needs.      Screening Interventions   Interventions Encouraged to exercise    Expected Outcomes --             Quality of Life Scores:  Scores of 19 and below usually indicate a poorer quality of life in these areas.  A difference of  2-3 points is a clinically meaningful difference.  A difference of 2-3 points in the total score of the Quality of Life Index has been associated with significant improvement in overall quality of life, self-image, physical symptoms, and general health in studies assessing change in quality of life.  PHQ-9: Review Flowsheet       02/14/2023 10/08/2015  Depression screen PHQ 2/9  Decreased Interest 0 0  Down, Depressed, Hopeless 0 0  PHQ - 2  Score 0 0  Altered sleeping 0 -  Tired, decreased energy 1 -  Change in appetite 0 -  Feeling bad or failure about yourself  0 -  Trouble concentrating 0 -  Moving slowly or fidgety/restless 0 -  Suicidal thoughts 0 -  PHQ-9 Score 1 -  Difficult doing work/chores Somewhat difficult -   Interpretation of Total Score  Total Score Depression Severity:  1-4 = Minimal depression, 5-9 = Mild depression, 10-14 = Moderate depression, 15-19 = Moderately severe depression, 20-27 = Severe depression   Psychosocial Evaluation and Intervention:  Psychosocial Evaluation - 02/14/23 1431       Psychosocial Evaluation & Interventions   Interventions Encouraged to exercise with the program and follow exercise prescription    Comments Virginia [name] denies any psychsocial issues             Psychosocial Re-Evaluation:  Psychosocial Re-Evaluation     Row Name 02/21/23 1315 03/16/23 0949           Psychosocial Re-Evaluation   Current issues with None Identified None Identified      Comments Virginia [name] denies any psychosocial barriers or concerns at this time. Virginia [name] denies any psychosocial barriers or concerns at this time.      Expected Outcomes For Virginia [name] to participate in PR free of psychosocial concerns For Virginia [name] to participate in PR free of psychosocial concerns      Interventions Encouraged to attend Pulmonary Rehabilitation for the exercise Encouraged to attend Pulmonary Rehabilitation for the exercise      Continue Psychosocial Services  No Follow up required No Follow up required  Psychosocial Discharge (Final Psychosocial Re-Evaluation):  Psychosocial Re-Evaluation - 03/16/23 0949       Psychosocial Re-Evaluation   Current issues with None Identified    Comments Virginia [name] denies any psychosocial barriers or concerns at this time.    Expected Outcomes For Virginia [name] to participate in PR free of psychosocial concerns    Interventions Encouraged to attend Pulmonary Rehabilitation for the exercise     Continue Psychosocial Services  No Follow up required             Education: Education Goals: Education classes will be provided on a weekly basis, covering required topics. Participant will state understanding/return demonstration of topics presented.  Learning Barriers/Preferences:  Learning Barriers/Preferences - 02/14/23 1431       Learning Barriers/Preferences   Learning Barriers None    Learning Preferences Written Material             Education Topics: Introduction to Pulmonary Rehab Group instruction provided by PowerPoint, verbal discussion, and written material to support subject matter. Instructor reviews what Pulmonary Rehab is, the purpose of the program, and how patients are referred.     Know Your Numbers Group instruction that is supported by a PowerPoint presentation. Instructor discusses importance of knowing and understanding resting, exercise, and post-exercise oxygen saturation, heart rate, and blood pressure. Oxygen saturation, heart rate, blood pressure, rating of perceived exertion, and dyspnea are reviewed along with a normal range for these values.    Exercise for the Pulmonary Patient Group instruction that is supported by a PowerPoint presentation. Instructor discusses benefits of exercise, core components of exercise, frequency, duration, and intensity of an exercise routine, importance of utilizing pulse oximetry during exercise, safety while exercising, and options of places to exercise outside of rehab.  Flowsheet Row PULMONARY REHAB CHRONIC OBSTRUCTIVE PULMONARY DISEASE from 03/22/2023 in [facility]  Date 03/22/23  Educator EP  Instruction Review Code 1- Verbalizes Understanding          MET Level  Group instruction provided by PowerPoint, verbal discussion, and written material to support subject matter. Instructor reviews what METs are and how to increase METs.  Flowsheet  Row PULMONARY REHAB CHRONIC OBSTRUCTIVE PULMONARY DISEASE from 02/20/2023 in [facility]  Date 02/20/23  Educator EP  Instruction Review Code 1- Verbalizes Understanding       Pulmonary Medications Verbally interactive group education provided by instructor with focus on inhaled medications and proper administration. Flowsheet Row PULMONARY REHAB CHRONIC OBSTRUCTIVE PULMONARY DISEASE from 03/15/2023 in [facility]  Date 03/15/23  Educator RT  Instruction Review Code 1- Verbalizes Understanding       Anatomy and Physiology of the Respiratory System Group instruction provided by PowerPoint, verbal discussion, and written material to support subject matter. Instructor reviews respiratory cycle and anatomical components of the respiratory system and their functions. Instructor also reviews differences in obstructive and restrictive respiratory diseases with examples of each.  Flowsheet Row PULMONARY REHAB CHRONIC OBSTRUCTIVE PULMONARY DISEASE from 02/22/2023 in [facility]  Date 02/22/23  Educator EP  Instruction Review Code 1- Verbalizes Understanding       Oxygen Safety Group instruction provided by PowerPoint, verbal discussion, and written material to support subject matter. There is an overview of "What is Oxygen" and "Why do we need it".  Instructor also reviews how to  create a safe environment for oxygen use, the importance of using oxygen as prescribed, and the risks of noncompliance. There is a brief discussion on traveling with oxygen and resources the patient may utilize.   Oxygen Use Group instruction provided by PowerPoint, verbal discussion, and written material to discuss how supplemental oxygen is prescribed and different types of oxygen supply systems. Resources for more information are provided.     Breathing Techniques Group instruction that is supported by demonstration and informational handouts. Instructor discusses the benefits of pursed lip and diaphragmatic breathing and detailed demonstration on how to perform both.     Risk Factor Reduction Group instruction that is supported by a PowerPoint presentation. Instructor discusses the definition of a risk factor, different risk factors for pulmonary disease, and how the heart and lungs work together.   MD [name] A group question and answer session with a medical doctor that allows participants to ask questions that relate to their pulmonary disease state.   Nutrition for the Pulmonary Patient Group instruction provided by PowerPoint slides, verbal discussion, and written materials to support subject matter. The instructor gives an explanation and review of healthy diet recommendations, which includes a discussion on weight management, recommendations for fruit and vegetable consumption, as well as protein, fluid, caffeine, fiber, sodium, sugar, and alcohol. Tips for eating when patients are short of breath are discussed.    Other Education Group or individual verbal, written, or video instructions that support the educational goals of the pulmonary rehab program.    Knowledge Questionnaire Score:  Knowledge Questionnaire Score - 02/14/23 1447       Knowledge Questionnaire Score   Pre Score 12/18             Core Components/Risk Factors/Patient Goals at Admission:  Personal Goals and Risk Factors at Admission - 03/16/23 0950       Core Components/Risk Factors/Patient Goals on Admission    Weight Management Weight Maintenance    Improve shortness of breath with ADL's Yes    Intervention Provide education, individualized exercise plan and daily activity instruction to help decrease symptoms of SOB with activities of daily living.    Expected Outcomes Short Term: Improve cardiorespiratory fitness to achieve a  reduction of symptoms when performing ADLs;Long Term: Be able to perform more ADLs without symptoms or delay the onset of symptoms    Increase knowledge of respiratory medications and ability to use respiratory devices properly  Yes    Intervention Provide education and demonstration as needed of appropriate use of medications, inhalers, and oxygen therapy.    Expected Outcomes Short Term: Achieves understanding of medications use. Understands that oxygen is a medication prescribed by physician. Demonstrates appropriate use of inhaler and oxygen therapy.;Long Term: Maintain appropriate use of medications, inhalers, and oxygen therapy.             Core Components/Risk Factors/Patient Goals Review:   Goals and Risk Factor Review     Row Name 02/21/23 1316 03/16/23 0950           Core Components/Risk Factors/Patient Goals Review   Personal Goals Review Develop more efficient breathing techniques such as purse lipped breathing and diaphragmatic breathing and practicing self-pacing with activity.;Increase knowledge of respiratory medications and ability to use respiratory devices properly.;Improve shortness of breath with ADL's Develop more efficient breathing techniques such as purse lipped breathing and diaphragmatic breathing and practicing self-pacing with activity.;Increase knowledge of respiratory medications and ability to use respiratory devices properly.;Improve shortness of breath with ADL's  Review Virginia [name] has attended 1 class so far. She is currently exercising on the recumbent bike and the Nustep. Virginia [name] is practicing pursed lip breathing. We will continue to assess any progress Virginia [name] makes throughout the program. Virginia [name] has attended 8 classes so far. She is currently exercising on the recumbent bike, Nustep, and walking the track depending on her pain due to scoliosis. She is making little improvement at this time. Virginia [name] is practicing pursed lip breathing but needs to be reminded to do so when  short of breath or in pain. Virginia [name] has been maintaining her weight since starting the program. She knows how to report her Rate of Perceived Exertion and her Dyspnea scale to staff. Virginia [name] has attended the sedentary lifestyle class and the pulmonary medications class. Virginia [name] has correctly stated when to use her inhaler and has properly demonstrated it with our respiratory therapist.      Expected Outcomes See admission goals See admission goals               Core Components/Risk Factors/Patient Goals at Discharge (Final Review):   Goals and Risk Factor Review - 03/16/23 0950       Core Components/Risk Factors/Patient Goals Review   Personal Goals Review Develop more efficient breathing techniques such as purse lipped breathing and diaphragmatic breathing and practicing self-pacing with activity.;Increase knowledge of respiratory medications and ability to use respiratory devices properly.;Improve shortness of breath with ADL's    Review Virginia [name] has attended 8 classes so far. She is currently exercising on the recumbent bike, Nustep, and walking the track depending on her pain due to scoliosis. She is making little improvement at this time. Virginia [name] is practicing pursed lip breathing but needs to be reminded to do so when short of breath or in pain. Virginia [name] has been maintaining her weight since starting the program. She knows how to report her Rate of Perceived Exertion and her Dyspnea scale to staff. Virginia [name] has attended the sedentary lifestyle class and the pulmonary medications class. Virginia [name] has correctly stated when to use her inhaler and has properly demonstrated it with our respiratory therapist.    Expected Outcomes See admission goals             ITP Comments:   Comments: Dr. Mechele Collin is Medical Director for Pulmonary Rehab at [facility].

## 2023-03-28 NOTE — Telephone Encounter (Signed)
Patient will not become dependent on oxygen. If she needs it, she needs it.  She is to use 4L of Oxygen with exertion as this is what was noted her requirement is at pulmonary rehab.   If her oxygen levels are above 88% at rest, then she does not need to wear the oxygen when at rest, only with exertion.  Thanks, JD

## 2023-03-28 NOTE — Telephone Encounter (Signed)
Spoke with patient. She wants to know how long she will be on oxygen for. She is worried she will become dependent on it. She has only been using 2L instead of the 4L. She states 02 has been in the upper 90's, she felt the 4 was just too much.   Dr. Francine Graven can you please advise

## 2023-03-28 NOTE — Telephone Encounter (Signed)
Spoke with patient. Discussed recommendations from Dr. Francine Graven. Pt verbalized understanding. NFN

## 2023-03-28 NOTE — Telephone Encounter (Signed)
Patient would like instructions on how to use oxygen and equipment. Patient phone number is [phone].

## 2023-03-29 ENCOUNTER — Encounter (HOSPITAL_COMMUNITY): Payer: Medicare Other

## 2023-03-29 ENCOUNTER — Ambulatory Visit (HOSPITAL_COMMUNITY): Payer: Medicare Other

## 2023-03-30 ENCOUNTER — Telehealth (HOSPITAL_COMMUNITY): Payer: Self-pay

## 2023-03-30 NOTE — Telephone Encounter (Signed)
Called pt to check on her since she missed PR on 4/11. She stated she was stressed from having her oxygen delivered the day before and also she didn't feel like coming out in the bad weather. She stated she will return back to class on 4/16.

## 2023-04-02 ENCOUNTER — Encounter: Payer: Self-pay | Admitting: Pulmonary Disease

## 2023-04-02 ENCOUNTER — Ambulatory Visit: Payer: Medicare Other | Admitting: Pulmonary Disease

## 2023-04-02 VITALS — BP 130/74 | HR 86 | Ht 65.0 in | Wt 117.0 lb

## 2023-04-02 DIAGNOSIS — J449 Chronic obstructive pulmonary disease, unspecified: Secondary | ICD-10-CM

## 2023-04-02 NOTE — Patient Instructions (Addendum)
Continue anoro ellipta 1 puff daily  Continue Qvar 2 puffs twice daily - rinse mouth out after each use  Continue albuterol 1-2 puffs every 4-6 hours as needed  Continue with cardiopulmonary rehab  Follow up in 6 months.
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
## 2023-04-03 ENCOUNTER — Ambulatory Visit (HOSPITAL_COMMUNITY): Payer: Medicare Other

## 2023-04-03 ENCOUNTER — Encounter (HOSPITAL_COMMUNITY)
Admission: RE | Admit: 2023-04-03 | Discharge: 2023-04-03 | Disposition: A | Discharge status: Home / Self Care | Payer: Medicare Other | Source: Ambulatory Visit | Attending: Pulmonary Disease | Admitting: Pulmonary Disease

## 2023-04-03 VITALS — Wt 116.2 lb

## 2023-04-03 DIAGNOSIS — J449 Chronic obstructive pulmonary disease, unspecified: Secondary | ICD-10-CM | POA: Diagnosis not present

## 2023-04-03 NOTE — Progress Notes (Signed)
Daily Session Note  Patient Details  Name: Virginia [name] MRN: [id number] Date of Birth: Aug 09, 1941 Referring Provider:   Doristine Devoid Pulmonary Rehab Walk Test from 02/14/2023 in [facility] for Heart, Vascular, & Lung Health  Referring Provider Dewald       Encounter Date: 04/03/2023  Check In:  Session Check In - 04/03/23 1625       Check-In   Supervising physician immediately available to respond to emergencies [facility] MD immediately available    Physician(s) Jari Favre, PA    Location [facility]    Staff Present Essie Hart, RN, BSN;Randi Idelle Crouch BS, ACSM-CEP, Exercise Physiologist;Kaylee Earlene Plater, MS, ACSM-CEP, Exercise Physiologist;Zarion Oliff Katrinka Blazing, RT    Virtual Visit No    Medication changes reported     No    Fall or balance concerns reported    No    Tobacco Cessation No Change    Warm-up and Cool-down Performed as group-led instruction    Resistance Training Performed Yes    VAD Patient? No    PAD/SET Patient? No      Pain Assessment   Currently in Pain? No/denies    Multiple Pain Sites No             Capillary Blood Glucose: No results found for this or any previous visit (from the past 24 hour(s)).   Exercise Prescription Changes - 04/03/23 1600       Response to Exercise   Blood Pressure (Admit) 122/66    Blood Pressure (Exercise) 154/90    Blood Pressure (Exit) 124/68    Heart Rate (Admit) 99 bpm    Heart Rate (Exercise) 81 bpm    Heart Rate (Exit) 83 bpm    Oxygen Saturation (Admit) 99 %    Oxygen Saturation (Exercise) 94 %    Oxygen Saturation (Exit) 89 %    Rating of Perceived Exertion (Exercise) 10    Perceived Dyspnea (Exercise) 1    Duration Continue with 30 min of aerobic exercise without signs/symptoms of physical distress.    Intensity THRR unchanged      Resistance Training   Training Prescription Yes    Weight red bands    Reps 10-15    Time 10 Minutes      Oxygen   Oxygen Continuous     Liters 4      NuStep   Level 1    SPM 60    Minutes 15    METs 2.3      Track   Laps 12    Minutes 15    METs 2.85      Oxygen   Maintain Oxygen Saturation 88% or higher             Social History   Tobacco Use  Smoking Status Former   Packs/day: 2.00   Years: 42.00   Additional pack years: 0.00   Total pack years: 84.00   Types: Cigarettes   Start date: 12/18/1954   Quit date: 07/12/2002   Years since quitting: 20.7  Smokeless Tobacco Never  Tobacco Comments   Quit smoking for about 5 years - 1974-1979    Goals Met:  Proper associated with RPD/PD & O2 Sat Independence with exercise equipment Exercise tolerated well No report of concerns or symptoms today Strength training completed today  Goals Unmet:  Not Applicable  Comments: Service time is from 1021 to 1142.    Dr. Mechele Collin is Medical Director for Pulmonary Rehab at [facility].

## 2023-04-05 ENCOUNTER — Ambulatory Visit (HOSPITAL_COMMUNITY): Payer: Medicare Other

## 2023-04-05 ENCOUNTER — Encounter (HOSPITAL_COMMUNITY)
Admission: RE | Admit: 2023-04-05 | Discharge: 2023-04-05 | Disposition: A | Discharge status: Home / Self Care | Payer: Medicare Other | Source: Ambulatory Visit | Attending: Pulmonary Disease | Admitting: Pulmonary Disease

## 2023-04-05 DIAGNOSIS — J449 Chronic obstructive pulmonary disease, unspecified: Secondary | ICD-10-CM | POA: Diagnosis not present

## 2023-04-05 NOTE — Progress Notes (Signed)
Daily Session Note  Patient Details  Name: Virginia [name] MRN: [id number] Date of Birth: [birth year] Referring Provider:   Doristine Devoid Pulmonary Rehab Walk Test from 02/14/2023 in [facility] for Heart, Vascular, & Lung Health  Referring Provider Dewald       Encounter Date: 04/05/2023  Check In:  Session Check In - 04/05/23 1218       Check-In   Supervising physician immediately available to respond to emergencies [facility] MD immediately available    Physician(s) Eligha Bridegroom, NP    Location [facility]    Staff Present Essie Hart, RN, BSN;Randi Idelle Crouch BS, ACSM-CEP, Exercise Physiologist;Shanon Becvar Earlene Plater, MS, ACSM-CEP, Exercise Physiologist;Casey Katrinka Blazing, RT    Virtual Visit No    Medication changes reported     No    Fall or balance concerns reported    No    Tobacco Cessation No Change    Warm-up and Cool-down Performed as group-led instruction    Resistance Training Performed Yes    VAD Patient? No    PAD/SET Patient? No      Pain Assessment   Currently in Pain? No/denies    Pain Score 0-No pain    Multiple Pain Sites No             Capillary Blood Glucose: No results found for this or any previous visit (from the past 24 hour(s)).    Social History   Tobacco Use  Smoking Status Former   Packs/day: 2.00   Years: 42.00   Additional pack years: 0.00   Total pack years: 84.00   Types: Cigarettes   Start date: 12/18/1954   Quit date: 07/12/2002   Years since quitting: 20.7  Smokeless Tobacco Never  Tobacco Comments   Quit smoking for about 5 years - 1974-1979    Goals Met:  Proper associated with RPD/PD & O2 Sat Independence with exercise equipment Exercise tolerated well No report of concerns or symptoms today Strength training completed today  Goals Unmet:  Not Applicable  Comments: Service time is from 1009 to 1156.    Dr. Mechele Collin is Medical Director for Pulmonary Rehab at [facility].

## 2023-04-10 ENCOUNTER — Encounter (HOSPITAL_COMMUNITY)
Admission: RE | Admit: 2023-04-10 | Discharge: 2023-04-10 | Disposition: A | Discharge status: Home / Self Care | Payer: Medicare Other | Source: Ambulatory Visit | Attending: Pulmonary Disease | Admitting: Pulmonary Disease

## 2023-04-10 ENCOUNTER — Ambulatory Visit (HOSPITAL_COMMUNITY): Payer: Medicare Other

## 2023-04-10 DIAGNOSIS — J449 Chronic obstructive pulmonary disease, unspecified: Secondary | ICD-10-CM | POA: Diagnosis not present

## 2023-04-10 NOTE — Progress Notes (Signed)
Daily Session Note  Patient Details  Name: Virginia [name] MRN: [id number] Date of Birth: 1941/08/10 Referring Provider:   Doristine Devoid Pulmonary Rehab Walk Test from 02/14/2023 in [facility] for Heart, Vascular, & Lung Health  Referring Provider Dewald       Encounter Date: 04/10/2023  Check In:  Session Check In - 04/10/23 1213       Check-In   Supervising physician immediately available to respond to emergencies [facility] MD immediately available    Physician(s) Eligha Bridegroom, NP    [facility]    Staff Present Essie Hart, RN, BSN;Zavian Slowey Idelle Crouch BS, ACSM-CEP, Exercise Physiologist;Kaylee Earlene Plater, MS, ACSM-CEP, Exercise Physiologist;Casey Hermine Messick Belarus, RD, LDN    Virtual Visit No    Medication changes reported     No    Fall or balance concerns reported    No    Comments pt uses a cane    Tobacco Cessation No Change    Warm-up and Cool-down Performed as group-led instruction    Resistance Training Performed Yes    VAD Patient? No    PAD/SET Patient? No      Pain Assessment   Currently in Pain? No/denies             Capillary Blood Glucose: No results found for this or any previous visit (from the past 24 hour(s)).    Social History   Tobacco Use  Smoking Status Former   Packs/day: 2.00   Years: 42.00   Additional pack years: 0.00   Total pack years: 84.00   Types: Cigarettes   Start date: 12/18/1954   Quit date: 07/12/2002   Years since quitting: 20.7  Smokeless Tobacco Never  Tobacco Comments   Quit smoking for about 5 years - 1974-1979    Goals Met:  Independence with exercise equipment Improved SOB with ADL's Exercise tolerated well No report of concerns or symptoms today Strength training completed today  Goals Unmet:  Not Applicable  Comments: Pt now is carrying her POC to class. Service time is from 1019 to 1150.    Dr. Mechele Collin is Medical Director for Pulmonary Rehab at  [facility].

## 2023-04-12 ENCOUNTER — Ambulatory Visit (HOSPITAL_COMMUNITY): Payer: Medicare Other

## 2023-04-12 ENCOUNTER — Encounter (HOSPITAL_COMMUNITY)
Admission: RE | Admit: 2023-04-12 | Discharge: 2023-04-12 | Disposition: A | Discharge status: Home / Self Care | Payer: Medicare Other | Source: Ambulatory Visit | Attending: Pulmonary Disease | Admitting: Pulmonary Disease

## 2023-04-12 DIAGNOSIS — J449 Chronic obstructive pulmonary disease, unspecified: Secondary | ICD-10-CM

## 2023-04-12 NOTE — Progress Notes (Signed)
Home Exercise Prescription I have reviewed a Home Exercise Prescription with Virginia [name]. Virginia [name] is currently exercising at home. She walks 3 days/wk for 30+ min/day. Virginia [name] likes to walk in the store so that she can hold onto a cart. I encouraged her to keep her store walking up and to notify anyone around her if she is having any signs and symptoms. She agreed with my recommendations. Virginia [name] is hoping to get back into golfing. I discussed strategies for golfing if she tries to play again. She told me that her son would take her golfing. The patient stated that their goals were to get back into golfing. We reviewed exercise guidelines, target heart rate during exercise, RPE Scale, weather conditions, endpoints for exercise, warmup and cool down. The patient is encouraged to come to me with any questions. I will continue to follow up with the patient to assist them with progression and safety. Spent 15 min with patient discussing home exercise plan and goals  Joya San, MS, ACSM-CEP 04/12/2023 3:42 PM

## 2023-04-12 NOTE — Progress Notes (Signed)
Daily Session Note  Patient Details  Name: Virginia [name] MRN: [id number] Date of Birth: [birth year] Referring Provider:   Doristine Devoid Pulmonary Rehab Walk Test from 02/14/2023 in [facility] for Heart, Vascular, & Lung Health  Referring Provider Dewald       Encounter Date: 04/12/2023  Check In:  Session Check In - 04/12/23 1209       Check-In   Supervising physician immediately available to respond to emergencies [facility] MD immediately available    Physician(s) Bernadene Person, NP    Location [facility]    Staff Present Essie Hart, RN, BSN;Randi Idelle Crouch BS, ACSM-CEP, Exercise Physiologist;Kaylee Earlene Plater, MS, ACSM-CEP, Exercise Physiologist;Sammi Stolarz Hermine Messick Belarus, RD, LDN    Virtual Visit No    Medication changes reported     No    Fall or balance concerns reported    No    Comments pt uses a cane    Tobacco Cessation No Change    Warm-up and Cool-down Performed as group-led instruction    Resistance Training Performed Yes    VAD Patient? No    PAD/SET Patient? No      Pain Assessment   Currently in Pain? No/denies    Pain Score 0-No pain    Multiple Pain Sites No             Capillary Blood Glucose: No results found for this or any previous visit (from the past 24 hour(s)).    Social History   Tobacco Use  Smoking Status Former   Packs/day: 2.00   Years: 42.00   Additional pack years: 0.00   Total pack years: 84.00   Types: Cigarettes   Start date: 12/18/1954   Quit date: 07/12/2002   Years since quitting: 20.7  Smokeless Tobacco Never  Tobacco Comments   Quit smoking for about 5 years - 1974-1979    Goals Met:  Proper associated with RPD/PD & O2 Sat Independence with exercise equipment Exercise tolerated well No report of concerns or symptoms today Strength training completed today  Goals Unmet:  Not Applicable  Comments: Service time is from 1018 to 1150.    Dr. Mechele Collin is Medical Director  for Pulmonary Rehab at [facility].

## 2023-04-17 ENCOUNTER — Ambulatory Visit (HOSPITAL_COMMUNITY): Payer: Medicare Other

## 2023-04-17 ENCOUNTER — Encounter (HOSPITAL_COMMUNITY)
Admission: RE | Admit: 2023-04-17 | Discharge: 2023-04-17 | Disposition: A | Discharge status: Home / Self Care | Payer: Medicare Other | Source: Ambulatory Visit | Attending: Pulmonary Disease | Admitting: Pulmonary Disease

## 2023-04-17 VITALS — Wt 116.2 lb

## 2023-04-17 DIAGNOSIS — J449 Chronic obstructive pulmonary disease, unspecified: Secondary | ICD-10-CM

## 2023-04-17 NOTE — Progress Notes (Signed)
Daily Session Note  Patient Details  Name: Virginia [name] MRN: [id number] Date of Birth: 19-Mar-1941 Referring Provider:   Doristine Devoid Pulmonary Rehab Walk Test from 02/14/2023 in [facility]  Referring Provider Dewald       Encounter Date: 04/17/2023  Check In:  Session Check In - 04/17/23 1132       Check-In   Supervising physician immediately available to respond to emergencies [facility] MD immediately available    Physician(s) Robin Searing, NP    Location [facility]    Staff Present Karlene Lineman, RN, BSN;Samantha Belarus, RD, Dutch Gray, RN, Doris Cheadle, MS, ACSM-CEP, Exercise Physiologist;Jolina Symonds Dionisio Paschal, ACSM-CEP, Exercise Physiologist;Casey Katrinka Blazing, RT    Virtual Visit No    Medication changes reported     No    Fall or balance concerns reported    No    Tobacco Cessation No Change    Warm-up and Cool-down Performed as group-led instruction    Resistance Training Performed Yes    VAD Patient? No    PAD/SET Patient? No      Pain Assessment   Currently in Pain? No/denies    Multiple Pain Sites No             Capillary Blood Glucose: No results found for this or any previous visit (from the past 24 hour(s)).   Exercise Prescription Changes - 04/17/23 1200       Response to Exercise   Blood Pressure (Admit) 130/80    Blood Pressure (Exercise) 138/70    Blood Pressure (Exit) 138/82    Heart Rate (Admit) 98 bpm    Heart Rate (Exercise) 127 bpm    Heart Rate (Exit) 95 bpm    Oxygen Saturation (Admit) 99 %    Oxygen Saturation (Exercise) 87 %    Oxygen Saturation (Exit) 92 %    Rating of Perceived Exertion (Exercise) 13    Perceived Dyspnea (Exercise) 3    Duration Progress to 30 minutes of  aerobic without signs/symptoms of physical distress    Intensity THRR unchanged      Progression   Progression Continue to progress workloads to maintain intensity without signs/symptoms  of physical distress.    Average METs 2.85      Resistance Training   Training Prescription Yes    Weight red bands    Reps 10-15    Time 10 Minutes      Oxygen   Oxygen Continuous    Liters 4      NuStep   Level 2    SPM 60    Minutes 15    METs 2.1      Track   Laps 12    Minutes 15    METs 2.85             Social History   Tobacco Use  Smoking Status Former   Packs/day: 2.00   Years: 42.00   Additional pack years: 0.00   Total pack years: 84.00   Types: Cigarettes   Start date: 12/18/1954   Quit date: 07/12/2002   Years since quitting: 20.7  Smokeless Tobacco Never  Tobacco Comments   Quit smoking for about 5 years - 1974-1979    Goals Met:  Independence with exercise equipment Improved SOB with ADL's Using PLB without cueing & demonstrates good technique Exercise tolerated well Strength training completed today  Goals Unmet:  Not Applicable  Comments: Service time is from  1018 to 1149.    Dr. Mechele Collin is Medical Director for Pulmonary Rehab at [facility].

## 2023-04-19 ENCOUNTER — Encounter (HOSPITAL_COMMUNITY): Payer: Medicare Other

## 2023-04-19 ENCOUNTER — Ambulatory Visit (HOSPITAL_COMMUNITY): Payer: Medicare Other

## 2023-04-21 ENCOUNTER — Encounter: Payer: Self-pay | Admitting: Pulmonary Disease

## 2023-04-24 ENCOUNTER — Ambulatory Visit (HOSPITAL_COMMUNITY): Payer: Medicare Other

## 2023-04-24 ENCOUNTER — Encounter (HOSPITAL_COMMUNITY)
Admission: RE | Admit: 2023-04-24 | Discharge: 2023-04-24 | Disposition: A | Discharge status: Home / Self Care | Payer: Medicare Other | Source: Ambulatory Visit | Attending: Pulmonary Disease | Admitting: Pulmonary Disease

## 2023-04-24 DIAGNOSIS — J449 Chronic obstructive pulmonary disease, unspecified: Secondary | ICD-10-CM

## 2023-04-24 NOTE — Progress Notes (Signed)
Daily Session Note  Patient Details  Name: Virginia [name] MRN: [id number] Date of Birth: 10/04/1941 Referring Provider:   Doristine Devoid Pulmonary Rehab Walk Test from 02/14/2023 in [facility]  Referring Provider Dewald       Encounter Date: 04/24/2023  Check In:  Session Check In - 04/24/23 1142       Check-In   Supervising physician immediately available to respond to emergencies [facility] MD immediately available    Physician(s) Carlos Levering, NP    [facility]    Staff Present Karlene Lineman, RN, Lavonia Dana, RN, Doris Cheadle, MS, ACSM-CEP, Exercise Physiologist;Randi Dionisio Paschal, ACSM-CEP, Exercise Physiologist;Jersey Espinoza Katrinka Blazing, RT    Virtual Visit No    Medication changes reported     No    Fall or balance concerns reported    No    Tobacco Cessation No Change    Warm-up and Cool-down Performed as group-led instruction    Resistance Training Performed Yes    VAD Patient? No    PAD/SET Patient? No      Pain Assessment   Currently in Pain? No/denies    Pain Score 0-No pain    Multiple Pain Sites No             Capillary Blood Glucose: No results found for this or any previous visit (from the past 24 hour(s)).    Social History   Tobacco Use  Smoking Status Former   Packs/day: 2.00   Years: 42.00   Additional pack years: 0.00   Total pack years: 84.00   Types: Cigarettes   Start date: 12/18/1954   Quit date: 07/12/2002   Years since quitting: 20.7  Smokeless Tobacco Never  Tobacco Comments   Quit smoking for about 5 years - 1974-1979    Goals Met:  Proper associated with RPD/PD & O2 Sat Independence with exercise equipment Exercise tolerated well No report of concerns or symptoms today Strength training completed today  Goals Unmet:  Not Applicable  Comments: Service time is from 1015 to 1152.    Dr. Mechele Collin is Medical Director for Pulmonary Rehab at  [facility].

## 2023-04-26 ENCOUNTER — Encounter (HOSPITAL_COMMUNITY)
Admission: RE | Admit: 2023-04-26 | Discharge: 2023-04-26 | Disposition: A | Discharge status: Home / Self Care | Payer: Medicare Other | Source: Ambulatory Visit | Attending: Pulmonary Disease | Admitting: Pulmonary Disease

## 2023-04-26 ENCOUNTER — Ambulatory Visit (HOSPITAL_COMMUNITY): Payer: Medicare Other

## 2023-04-26 DIAGNOSIS — J449 Chronic obstructive pulmonary disease, unspecified: Secondary | ICD-10-CM | POA: Diagnosis not present

## 2023-04-26 NOTE — Progress Notes (Signed)
Daily Session Note  Patient Details  Name: Virginia [name] MRN: [id number] Date of Birth: 1941/09/17 Referring Provider:   Doristine Devoid Pulmonary Rehab Walk Test from 02/14/2023 in [facility]  Referring Provider Dewald       Encounter Date: 04/26/2023  Check In:  Session Check In - 04/26/23 1120       Check-In   Supervising physician immediately available to respond to emergencies [facility] MD immediately available    Physician(s) Jari Favre, PA    [facility]    Staff Present Essie Hart, RN, Doris Cheadle, MS, ACSM-CEP, Exercise Physiologist;Randi Dionisio Paschal, ACSM-CEP, Exercise Physiologist;Tylisa Alcivar Heloise Ochoa, MS, ACSM-CEP, CCRP, Exercise Physiologist    Virtual Visit No    Medication changes reported     No    Fall or balance concerns reported    No    Tobacco Cessation No Change    Warm-up and Cool-down Performed as group-led instruction    Resistance Training Performed Yes    VAD Patient? No    PAD/SET Patient? No      Pain Assessment   Currently in Pain? No/denies    Pain Score 0-No pain    Multiple Pain Sites No             Capillary Blood Glucose: No results found for this or any previous visit (from the past 24 hour(s)).    Social History   Tobacco Use  Smoking Status Former   Packs/day: 2.00   Years: 42.00   Additional pack years: 0.00   Total pack years: 84.00   Types: Cigarettes   Start date: 12/18/1954   Quit date: 07/12/2002   Years since quitting: 20.8  Smokeless Tobacco Never  Tobacco Comments   Quit smoking for about 5 years - 1974-1979    Goals Met:  Proper associated with RPD/PD & O2 Sat Independence with exercise equipment Exercise tolerated well No report of concerns or symptoms today Strength training completed today  Goals Unmet:  Not Applicable  Comments: Service time is from 1008 to 1139.     Dr. Mechele Collin is Medical  Director for Pulmonary Rehab at [facility].

## 2023-05-01 ENCOUNTER — Ambulatory Visit (HOSPITAL_COMMUNITY): Payer: Medicare Other

## 2023-05-01 ENCOUNTER — Encounter (HOSPITAL_COMMUNITY)
Admission: RE | Admit: 2023-05-01 | Discharge: 2023-05-01 | Disposition: A | Discharge status: Home / Self Care | Payer: Medicare Other | Source: Ambulatory Visit | Attending: Pulmonary Disease | Admitting: Pulmonary Disease

## 2023-05-01 VITALS — Wt 115.1 lb

## 2023-05-01 DIAGNOSIS — J449 Chronic obstructive pulmonary disease, unspecified: Secondary | ICD-10-CM | POA: Diagnosis not present

## 2023-05-01 NOTE — Progress Notes (Signed)
Daily Session Note  Patient Details  Name: Virginia [name] MRN: [id number] Date of Birth: 1941/06/10 Referring Provider:   Doristine Devoid Pulmonary Rehab Walk Test from 02/14/2023 in [facility]  Referring Provider Dewald       Encounter Date: 05/01/2023  Check In:  Session Check In - 05/01/23 1121       Check-In   Supervising physician immediately available to respond to emergencies [facility] MD immediately available    Physician(s) Jari Favre, PA    Location [facility]    Staff Present Samantha Belarus, RD, Dutch Gray, RN, BSN;Randi Reeve BS, ACSM-CEP, Exercise Physiologist;Olinty Peggye Pitt, MS, ACSM-CEP, Exercise Physiologist;Elajah Kunsman Earlene Plater, MS, ACSM-CEP, Exercise Physiologist;Casey Katrinka Blazing, RT    Virtual Visit No    Medication changes reported     No    Fall or balance concerns reported    No    Comments pt uses a cane    Tobacco Cessation No Change    Warm-up and Cool-down Performed as group-led instruction    Resistance Training Performed Yes    VAD Patient? No    PAD/SET Patient? No      Pain Assessment   Currently in Pain? No/denies    Multiple Pain Sites No             Capillary Blood Glucose: No results found for this or any previous visit (from the past 24 hour(s)).   Exercise Prescription Changes - 05/01/23 1200       Response to Exercise   Blood Pressure (Admit) 128/80    Blood Pressure (Exercise) 152/90    Blood Pressure (Exit) 118/74    Heart Rate (Admit) 82 bpm    Heart Rate (Exercise) 121 bpm    Heart Rate (Exit) 88 bpm    Oxygen Saturation (Admit) 99 %    Oxygen Saturation (Exercise) 93 %    Oxygen Saturation (Exit) 91 %    Rating of Perceived Exertion (Exercise) 13    Perceived Dyspnea (Exercise) 1    Duration Continue with 30 min of aerobic exercise without signs/symptoms of physical distress.    Intensity THRR unchanged      Progression   Progression Continue to  progress workloads to maintain intensity without signs/symptoms of physical distress.      Resistance Training   Training Prescription Yes    Weight red bands    Reps 10-15    Time 10 Minutes      Oxygen   Oxygen Continuous    Liters 4      NuStep   Level 3    SPM 60    Minutes 15    METs 2.8      Track   Laps 10    Minutes 15    METs 2.54             Social History   Tobacco Use  Smoking Status Former   Packs/day: 2.00   Years: 42.00   Additional pack years: 0.00   Total pack years: 84.00   Types: Cigarettes   Start date: 12/18/1954   Quit date: 07/12/2002   Years since quitting: 20.8  Smokeless Tobacco Never  Tobacco Comments   Quit smoking for about 5 years - 1974-1979    Goals Met:  Proper associated with RPD/PD & O2 Sat Exercise tolerated well No report of concerns or symptoms today Strength training completed today  Goals Unmet:  Not Applicable  Comments: Service time  is from 1016 to 1141.    Dr. Mechele Collin is Medical Director for Pulmonary Rehab at [facility].

## 2023-05-02 NOTE — Progress Notes (Signed)
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 Term:  Able to use RPE to guide intensity level when exercising independently Short Term: Able to use RPE daily in rehab to express subjective intensity level;Long Term:  Able to use RPE to guide intensity level when exercising independently Short Term: Able to use RPE daily in rehab to express subjective intensity level;Long Term:  Able to use RPE to guide intensity level when exercising independently    Able to understand and use Dyspnea scale Yes Yes Yes Yes    Intervention Provide education and explanation on how to use Dyspnea scale Provide education and explanation on how to use Dyspnea scale Provide education and explanation on how to use Dyspnea scale Provide education and explanation on how to use Dyspnea scale    Expected Outcomes Short  Term: Able to use Dyspnea scale daily in rehab to express subjective sense of shortness of breath during exertion;Long Term: Able to use Dyspnea scale to guide intensity level when exercising independently Short Term: Able to use Dyspnea scale daily in rehab to express subjective sense of shortness of breath during exertion;Long Term: Able to use Dyspnea scale to guide intensity level when exercising independently Short Term: Able to use Dyspnea scale daily in rehab to express subjective sense of shortness of breath during exertion;Long Term: Able to use Dyspnea scale to guide intensity level when exercising independently Short Term: Able to use Dyspnea scale daily in rehab to express subjective sense of shortness of breath during exertion;Long Term: Able to use Dyspnea scale to guide intensity level when exercising independently    Intervention Provide education and explanation of THRR including how the numbers were predicted and where they are located for reference Provide  education and explanation of THRR including how the numbers were predicted and where they are located for reference Provide education and explanation of THRR including how the numbers were predicted and where they are located for reference Provide education and explanation of THRR including how the numbers were predicted and where they are located for reference    Expected Outcomes Short Term: Able to state/look up THRR;Long Term: Able to use THRR to govern intensity when exercising independently;Short Term: Able to use daily as guideline for intensity in rehab Short Term: Able to state/look up THRR;Long Term: Able to use THRR to govern intensity when exercising independently;Short Term: Able to use daily as guideline for intensity in rehab Short Term: Able to state/look up THRR;Long Term: Able to use THRR to govern intensity when exercising independently;Short Term: Able to use daily as guideline for intensity in rehab Short Term: Able to  state/look up THRR;Long Term: Able to use THRR to govern intensity when exercising independently;Short Term: Able to use daily as guideline for intensity in rehab    Understanding of Exercise Prescription Yes Yes Yes Yes    Intervention Provide education, explanation, and written materials on patient's individual exercise prescription Provide education, explanation, and written materials on patient's individual exercise prescription Provide education, explanation, and written materials on patient's individual exercise prescription Provide education, explanation, and written materials on patient's individual exercise prescription    Expected Outcomes Short Term: Able to explain program exercise prescription;Long Term: Able to explain home exercise prescription to exercise independently Short Term: Able to explain program exercise prescription;Long Term: Able to explain home exercise prescription to exercise independently Short Term: Able to explain program exercise prescription;Long Term: Able to explain home exercise prescription to exercise independently Short Term: Able to explain program exercise prescription;Long Term: Able to explain home exercise prescription to exercise independently             Exercise Goals Re-Evaluation :  Exercise Goals Re-Evaluation     Row Name 02/22/23 1635 03/19/23 0838 04/26/23 1622         Exercise Goal Re-Evaluation   Exercise Goals Review Increase Physical Activity;Able to understand and use Dyspnea scale;Understanding of Exercise Prescription;Increase Strength and Stamina;Knowledge and understanding of Target Heart Rate Range (THRR);Able to understand and use rate of perceived exertion (RPE) scale Increase Physical Activity;Able to understand and use Dyspnea scale;Understanding of Exercise Prescription;Increase Strength and Stamina;Knowledge and understanding of Target Heart Rate Range (THRR);Able to understand and use rate of perceived exertion (RPE) scale  Increase Physical Activity;Able to understand and use Dyspnea scale;Understanding of Exercise Prescription;Increase Strength and Stamina;Knowledge and understanding of Target Heart Rate Range (THRR);Able to understand and use rate of perceived exertion (RPE) scale     Comments Virginia [name] has completed 2 exercise sessions. She exercises for 15 min on the recumbent bike and Nustep. Virginia [name] averages 2.5 METs at level 1 on the recumbent bike and 2.2 METs at level 2 on the Nustep. She performs the warmup and cooldown standing without limitations. Virginia [name] is deconditioned. It is too soon to notate any discernable progressions. Will continue to monitor and progress as able. Virginia [name] has completed 8 exercise sessions. She exercises for 15 min on the recumbent bike and track. Virginia [name] averages 2.2 METs at level 1.5 on the recumbent bike and 2.69 METs on the track. She performs the warmup and cooldown standing without limitations. Virginia [name] has switched from exercising on the Nustep to walking the track due to her hip pain. Her chronic hip pain has limited her  progression. Her level on the recumbent bike has not increased and her exercising time has been inconsistent. Will continue to monitor and progress as able. Virginia [name] has completed 18 exercise sessions. She exercises for 15 min on the track and Nustep. Virginia [name] averages 2.38 METs on the track and 1.9 METsat level 2 on the Nustep. Virginia [name] performs the warmup and cooldown standing without limitations. She has been slow to progress due to her chronic back and hip pain. She was switched from doing the recumbent bike to the Nustep. She enjoys track walking as it allows her to stretch her hip. I have discussed home exercise with Virginia [name] as I have encouraged her to walk around the grocery store. She seems motivated to exercise at home. Will continue to monitor and progress as able.     Expected Outcomes Through exercise at rehab and home, the patient will decrease shortness of breath with daily activities and feel confident  in carrying out an exercise regimen at home. Through exercise at rehab and home, the patient will decrease shortness of breath with daily activities and feel confident in carrying out an exercise regimen at home. Through exercise at rehab and home, the patient will decrease shortness of breath with daily activities and feel confident in carrying out an exercise regimen at home.              Discharge Exercise Prescription (Final Exercise Prescription Changes):  Exercise Prescription Changes - 05/01/23 1200       Response to Exercise   Blood Pressure (Admit) 128/80    Blood Pressure (Exercise) 152/90    Blood Pressure (Exit) 118/74    Heart Rate (Admit) 82 bpm    Heart Rate (Exercise) 121 bpm    Heart Rate (Exit) 88 bpm    Oxygen Saturation (Admit) 99 %    Oxygen Saturation (Exercise) 93 %    Oxygen Saturation (Exit) 91 %    Rating of Perceived Exertion (Exercise) 13    Perceived Dyspnea (Exercise) 1    Duration Continue with 30 min of aerobic exercise without signs/symptoms of physical distress.    Intensity THRR unchanged      Progression   Progression Continue to progress workloads to maintain intensity without signs/symptoms of physical distress.      Resistance Training   Training Prescription Yes    Weight red bands    Reps 10-15    Time 10 Minutes      Oxygen   Oxygen Continuous    Liters 4      NuStep   Level 3    SPM 60    Minutes 15    METs 2.8      Track   Laps 10    Minutes 15    METs 2.54             Nutrition:  Target Goals: Understanding of nutrition guidelines, daily intake of sodium 1500mg , cholesterol 200mg , calories 30% from fat and 7% or less from saturated fats, daily to have 5 or more servings of fruits and vegetables.  Biometrics:  Pre Biometrics - 02/14/23 1453       Pre Biometrics   Grip Strength 18 kg              Nutrition Therapy Plan and Nutrition Goals:  Nutrition Therapy & Goals - 04/17/23 1147        Nutrition Therapy   Diet General Healthy Diet      Personal Nutrition Goals   Nutrition Goal  Patient to improve diet quality by using the plate method as a daily guide for meal planning to include lean protein/plant protein, fruits, vegetables, whole grains, nonfat dairy as part of well balanced diet    Personal Goal #2 Patient to identify strategies for weight maintanence/weight gain of 0.5-2.0# per week.    Comments Goals have not been met. Reviewed weight history and weight goals. She reports normal appetite and maintaining weight of ~124# over the last year. Her weight is down 4# since starting with our program. Reviewed strategies for weight gain including calorie density, increasing eating frequency, etc. Virginia [name] will benefit from participation in pulmonary rehab for nutrition, exercise, and lifestyle modification.      Intervention Plan   Intervention Prescribe, educate and counsel regarding individualized specific dietary modifications aiming towards targeted core components such as weight, hypertension, lipid management, diabetes, heart failure and other comorbidities.;Nutrition handout(s) given to patient.    Expected Outcomes Short Term Goal: Understand basic principles of dietary content, such as calories, fat, sodium, cholesterol and nutrients.;Long Term Goal: Adherence to prescribed nutrition plan.             Nutrition Assessments:  MEDIFICTS Score Key: ?70 Need to make dietary changes  40-70 Heart Healthy Diet ? 40 Therapeutic Level Cholesterol Diet   Picture Your Plate Scores: <16 Unhealthy dietary pattern with much room for improvement. 41-50 Dietary pattern unlikely to meet recommendations for good health and room for improvement. 51-60 More healthful dietary pattern, with some room for improvement.  >60 Healthy dietary pattern, although there may be some specific behaviors that could be improved.    Nutrition Goals Re-Evaluation:  Nutrition Goals Re-Evaluation      Row Name 02/20/23 1139 03/20/23 1031 04/17/23 1147         Goals   Current Weight 117 lb 15.1 oz (53.5 kg)  reweighed x3 118 lb 2.7 oz (53.6 kg) 116 lb 2.9 oz (52.7 kg)     Comment GFR 55.7, lipids WNL-HDL 79, A1c 6.5; She will follow-up with PCP 03/08/23 Most recent labs from PCP  03/08/23 not available in [facility]; patient reports slightly elevated A1c Most recent labs from PCP 03/08/23 not available in [facility]; patient reports slightly elevated A1c     Expected Outcome Virginia [name] report eating 2 meals daily and 1-2 snacks. She reports normal appetite and maintaining weight of ~124# over the last year. Virginia [name] will benefit from participation in pulmonary rehab for nutrition, exercise, and lifestyle modification. Goals have not been met. Virginia [name] report eating 2 meals daily and 1-2 snacks. She reports normal appetite and maintaining weight of ~124# over the last year. Her weight is down 2# since starting with our program. Reviewed strategies for weight gain and gave handouts. Virginia [name] will benefit from participation in pulmonary rehab for nutrition, exercise, and lifestyle modification. Goals have not been met. Reviewed weight history and weight goals. She reports normal appetite and maintaining weight of ~124# over the last year. Her weight is down 4# since starting with our program. Reviewed strategies for weight gain including calorie density, increasing eating frequency, etc. Virginia [name] will benefit from participation in pulmonary rehab for nutrition, exercise, and lifestyle modification.              Nutrition Goals Discharge (Final Nutrition Goals Re-Evaluation):  Nutrition Goals Re-Evaluation - 04/17/23 1147       Goals   Current Weight 116 lb 2.9 oz (52.7 kg)    Comment Most recent labs from PCP 03/08/23 not available in [facility];  patient reports slightly elevated A1c    Expected Outcome Goals have not been met. Reviewed weight history and weight goals. She reports normal appetite and maintaining  weight of ~124# over the last year. Her weight is down 4# since starting with our program. Reviewed strategies for weight gain including calorie density, increasing eating frequency, etc. Virginia [name] will benefit from participation in pulmonary rehab for nutrition, exercise, and lifestyle modification.             Psychosocial: Target Goals: Acknowledge presence or absence of significant depression and/or stress, maximize coping skills, provide positive support system. Participant is able to verbalize types and ability to use techniques and skills needed for reducing stress and depression.  Initial Review & Psychosocial Screening:  Initial Psych Review & Screening - 02/14/23 1346       Initial Review   Current issues with None Identified      Family Dynamics   Good Support System? Yes      Barriers   Psychosocial barriers to participate in program There are no identifiable barriers or psychosocial needs.      Screening Interventions   Interventions Encouraged to exercise    Expected Outcomes --             Quality of Life Scores:  Scores of 19 and below usually indicate a poorer quality of life in these areas.  A difference of  2-3 points is a clinically meaningful difference.  A difference of 2-3 points in the total score of the Quality of Life Index has been associated with significant improvement in overall quality of life, self-image, physical symptoms, and general health in studies assessing change in quality of life.  PHQ-9: Review Flowsheet       02/14/2023 10/08/2015  Depression screen PHQ 2/9  Decreased Interest 0 0  Down, Depressed, Hopeless 0 0  PHQ - 2 Score 0 0  Altered sleeping 0 -  Tired, decreased energy 1 -  Change in appetite 0 -  Feeling bad or failure about yourself  0 -  Trouble concentrating 0 -  Moving slowly or fidgety/restless 0 -  Suicidal thoughts 0 -  PHQ-9 Score 1 -  Difficult doing work/chores Somewhat difficult -   Interpretation of Total  Score  Total Score Depression Severity:  1-4 = Minimal depression, 5-9 = Mild depression, 10-14 = Moderate depression, 15-19 = Moderately severe depression, 20-27 = Severe depression   Psychosocial Evaluation and Intervention:  Psychosocial Evaluation - 02/14/23 1431       Psychosocial Evaluation & Interventions   Interventions Encouraged to exercise with the program and follow exercise prescription    Comments Virginia [name] denies any psychsocial issues             Psychosocial Re-Evaluation:  Psychosocial Re-Evaluation     Row Name 02/21/23 1315 03/16/23 0949 04/23/23 1344         Psychosocial Re-Evaluation   Current issues with None Identified None Identified Current Stress Concerns     Comments Virginia [name] denies any psychosocial barriers or concerns at this time. Virginia [name] denies any psychosocial barriers or concerns at this time. Virginia [name] disclosed that she has been feeling down after getting put on oxygen. She stated because of it she will not be able to travel to [place] with her sister. Virginia [name] stated that she really likes to travel but not anymore due to her shortness of breath when walking and the need for oxygen. Virginia [name] stated she will start planning travel nationally instead of internationally.  Expected Outcomes For Virginia [name] to participate in PR free of psychosocial concerns For Virginia [name] to participate in PR free of psychosocial concerns For Virginia [name] to continue participating in PR with decreased stress by using positive coping mechanisms     Interventions Encouraged to attend Pulmonary Rehabilitation for the exercise Encouraged to attend Pulmonary Rehabilitation for the exercise Encouraged to attend Pulmonary Rehabilitation for the exercise     Continue Psychosocial Services  No Follow up required No Follow up required No Follow up required              Psychosocial Discharge (Final Psychosocial Re-Evaluation):  Psychosocial Re-Evaluation - 04/23/23 1344       Psychosocial Re-Evaluation   Current issues  with Current Stress Concerns    Comments Virginia [name] disclosed that she has been feeling down after getting put on oxygen. She stated because of it she will not be able to travel to [place] with her sister. Virginia [name] stated that she really likes to travel but not anymore due to her shortness of breath when walking and the need for oxygen. Virginia [name] stated she will start planning travel nationally instead of internationally.    Expected Outcomes For Virginia [name] to continue participating in PR with decreased stress by using positive coping mechanisms    Interventions Encouraged to attend Pulmonary Rehabilitation for the exercise    Continue Psychosocial Services  No Follow up required             Education: Education Goals: Education classes will be provided on a weekly basis, covering required topics. Participant will state understanding/return demonstration of topics presented.  Learning Barriers/Preferences:  Learning Barriers/Preferences - 02/14/23 1431       Learning Barriers/Preferences   Learning Barriers None    Learning Preferences Written Material             Education Topics: Introduction to Pulmonary Rehab Group instruction provided by PowerPoint, verbal discussion, and written material to support subject matter. Instructor reviews what Pulmonary Rehab is, the purpose of the program, and how patients are referred.     Know Your Numbers Group instruction that is supported by a PowerPoint presentation. Instructor discusses importance of knowing and understanding resting, exercise, and post-exercise oxygen saturation, heart rate, and blood pressure. Oxygen saturation, heart rate, blood pressure, rating of perceived exertion, and dyspnea are reviewed along with a normal range for these values.    Exercise for the Pulmonary Patient Group instruction that is supported by a PowerPoint presentation. Instructor discusses benefits of exercise, core components of exercise, frequency, duration, and  intensity of an exercise routine, importance of utilizing pulse oximetry during exercise, safety while exercising, and options of places to exercise outside of rehab.  Flowsheet Row PULMONARY REHAB CHRONIC OBSTRUCTIVE PULMONARY DISEASE from 03/22/2023 in [facility]  Date 03/22/23  Educator EP  Instruction Review Code 1- Verbalizes Understanding          MET Level  Group instruction provided by PowerPoint, verbal discussion, and written material to support subject matter. Instructor reviews what METs are and how to increase METs.  Flowsheet Row PULMONARY REHAB CHRONIC OBSTRUCTIVE PULMONARY DISEASE from 02/20/2023 in [facility]  Date 02/20/23  Educator EP  Instruction Review Code 1- Verbalizes Understanding       Pulmonary Medications Verbally interactive group education provided by instructor with focus on inhaled medications and proper administration. Flowsheet Row PULMONARY REHAB CHRONIC OBSTRUCTIVE PULMONARY DISEASE  from 03/15/2023 in [facility]  Date 03/15/23  Educator RT  Instruction Review Code 1- Verbalizes Understanding       Anatomy and Physiology of the Respiratory System Group instruction provided by PowerPoint, verbal discussion, and written material to support subject matter. Instructor reviews respiratory cycle and anatomical components of the respiratory system and their functions. Instructor also reviews differences in obstructive and restrictive respiratory diseases with examples of each.  Flowsheet Row PULMONARY REHAB CHRONIC OBSTRUCTIVE PULMONARY DISEASE from 02/22/2023 in [facility]  Date 02/22/23  Educator EP  Instruction Review Code 1- Verbalizes Understanding       Oxygen Safety Group instruction provided by PowerPoint,  verbal discussion, and written material to support subject matter. There is an overview of "What is Oxygen" and "Why do we need it".  Instructor also reviews how to create a safe environment for oxygen use, the importance of using oxygen as prescribed, and the risks of noncompliance. There is a brief discussion on traveling with oxygen and resources the patient may utilize.   Oxygen Use Group instruction provided by PowerPoint, verbal discussion, and written material to discuss how supplemental oxygen is prescribed and different types of oxygen supply systems. Resources for more information are provided.  Flowsheet Row PULMONARY REHAB CHRONIC OBSTRUCTIVE PULMONARY DISEASE from 04/12/2023 in [facility], [facility]  Date 04/12/23  Educator RT  Instruction Review Code 1- Verbalizes Understanding       Breathing Techniques Group instruction that is supported by demonstration and informational handouts. Instructor discusses the benefits of pursed lip and diaphragmatic breathing and detailed demonstration on how to perform both.     Risk Factor Reduction Group instruction that is supported by a PowerPoint presentation. Instructor discusses the definition of a risk factor, different risk factors for pulmonary disease, and how the heart and lungs work together.   MD Day A group question and answer session with a medical doctor that allows participants to ask questions that relate to their pulmonary disease state.   Nutrition for the Pulmonary Patient Group instruction provided by PowerPoint slides, verbal discussion, and written materials to support subject matter. The instructor gives an explanation and review of healthy diet recommendations, which includes a discussion on weight management, recommendations for fruit and vegetable consumption, as well as protein, fluid, caffeine, fiber, sodium, sugar, and alcohol. Tips for eating when patients are  short of breath are discussed.    Other Education Group or individual verbal, written, or video instructions that support the educational goals of the pulmonary rehab program.    Knowledge Questionnaire Score:  Knowledge Questionnaire Score - 02/14/23 1447       Knowledge Questionnaire Score   Pre Score 12/18             Core Components/Risk Factors/Patient Goals at Admission:  Personal Goals and Risk Factors at Admission - 03/16/23 0950       Core Components/Risk Factors/Patient Goals on Admission    Weight Management Weight Maintenance    Improve shortness of breath with ADL's Yes    Intervention Provide education, individualized exercise plan and daily activity instruction to help decrease symptoms of SOB with activities of daily living.    Expected Outcomes Short Term: Improve cardiorespiratory fitness to achieve a reduction of symptoms when performing ADLs;Long Term: Be able to perform more ADLs without symptoms or delay the onset of  symptoms    Increase knowledge of respiratory medications and ability to use respiratory devices properly  Yes    Intervention Provide education and demonstration as needed of appropriate use of medications, inhalers, and oxygen therapy.    Expected Outcomes Short Term: Achieves understanding of medications use. Understands that oxygen is a medication prescribed by physician. Demonstrates appropriate use of inhaler and oxygen therapy.;Long Term: Maintain appropriate use of medications, inhalers, and oxygen therapy.             Core Components/Risk Factors/Patient Goals Review:   Goals and Risk Factor Review     Row Name 02/21/23 1316 03/16/23 0950 04/23/23 1511         Core Components/Risk Factors/Patient Goals Review   Personal Goals Review Develop more efficient breathing techniques such as purse lipped breathing and diaphragmatic breathing and practicing self-pacing with activity.;Increase knowledge of respiratory medications and  ability to use respiratory devices properly.;Improve shortness of breath with ADL's Develop more efficient breathing techniques such as purse lipped breathing and diaphragmatic breathing and practicing self-pacing with activity.;Increase knowledge of respiratory medications and ability to use respiratory devices properly.;Improve shortness of breath with ADL's Develop more efficient breathing techniques such as purse lipped breathing and diaphragmatic breathing and practicing self-pacing with activity.;Increase knowledge of respiratory medications and ability to use respiratory devices properly.;Improve shortness of breath with ADL's;Weight Management/Obesity     Review Virginia [name] has attended 1 class so far. She is currently exercising on the recumbent bike and the Nustep. Virginia [name] is practicing pursed lip breathing. We will continue to assess any progress Virginia [name] makes throughout the program. Virginia [name] has attended 8 classes so far. She is currently exercising on the recumbent bike, Nustep, and walking the track depending on her pain due to scoliosis. She is making little improvement at this time. Virginia [name] is practicing pursed lip breathing but needs to be reminded to do so when short of breath or in pain. Virginia [name] has been maintaining her weight since starting the program. She knows how to report her Rate of Perceived Exertion and her Dyspnea scale to staff. Virginia [name] has attended the sedentary lifestyle class and the pulmonary medications class. Virginia [name] has correctly stated when to use her inhaler and has properly demonstrated it with our respiratory therapist. Virginia [name] is achieving her goal of maintaining her weight. She has been working with our dietitian. (See dietician notes) Virginia [name] has met her goal of increasing her knowledge of respiratory meds and using them properly. She has correctly stated when to use her inhaler and has properly demonstrated it with our respiratory therapist. She has also met her goal of developing more efficient breathing  techniques and self-pacing. Virginia [name] can initiate pursed lip breathing when she is short of breath without staff involvement. Virginia [name] is still working on improving her shortness of breath but feels like PR is helping.     Expected Outcomes See admission goals See admission goals See admission goals              Core Components/Risk Factors/Patient Goals at Discharge (Final Review):   Goals and Risk Factor Review - 04/23/23 1511       Core Components/Risk Factors/Patient Goals Review   Personal Goals Review Develop more efficient breathing techniques such as purse lipped breathing and diaphragmatic breathing and practicing self-pacing with activity.;Increase knowledge of respiratory medications and ability to use respiratory devices properly.;Improve shortness of breath with ADL's;Weight Management/Obesity    Review Virginia [name] is achieving her goal of maintaining her weight. She has been working  with our dietitian. (See dietician notes) Virginia [name] has met her goal of increasing her knowledge of respiratory meds and using them properly. She has correctly stated when to use her inhaler and has properly demonstrated it with our respiratory therapist. She has also met her goal of developing more efficient breathing techniques and self-pacing. Virginia [name] can initiate pursed lip breathing when she is short of breath without staff involvement. Virginia [name] is still working on improving her shortness of breath but feels like PR is helping.    Expected Outcomes See admission goals             ITP Comments:Pt is making expected progress toward Pulmonary Rehab goals after completing 19 sessions. Recommend continued exercise, life style modification, education, and utilization of breathing techniques to increase stamina and strength, while also decreasing shortness of breath with exertion.  Dr. Mechele Collin is Medical Director for Pulmonary Rehab at [facility].     Comments: Dr. Mechele Collin is Medical Director for Pulmonary  Rehab at [facility].

## 2023-05-03 ENCOUNTER — Ambulatory Visit (HOSPITAL_COMMUNITY): Payer: Medicare Other

## 2023-05-03 ENCOUNTER — Encounter (HOSPITAL_COMMUNITY)
Admission: RE | Admit: 2023-05-03 | Discharge: 2023-05-03 | Disposition: A | Discharge status: Home / Self Care | Payer: Medicare Other | Source: Ambulatory Visit | Attending: Pulmonary Disease | Admitting: Pulmonary Disease

## 2023-05-03 DIAGNOSIS — J449 Chronic obstructive pulmonary disease, unspecified: Secondary | ICD-10-CM | POA: Diagnosis not present

## 2023-05-03 NOTE — Progress Notes (Signed)
Daily Session Note  Patient Details  Name: Virginia [name] MRN: [id number] Date of Birth: 1941/11/10 Referring Provider:   Doristine Devoid Pulmonary Rehab Walk Test from 02/14/2023 in [facility]  Referring Provider Dewald       Encounter Date: 05/03/2023  Check In:  Session Check In - 05/03/23 1133       Check-In   Supervising physician immediately available to respond to emergencies [facility] MD immediately available    Physician(s) Eligha Bridegroom, NP    Location [facility]    Staff Present Samantha Belarus, RD, Dutch Gray, RN, BSN;Randi Reeve BS, ACSM-CEP, Exercise Physiologist;Kaylee Earlene Plater, MS, ACSM-CEP, Exercise Physiologist;Sufyan Meidinger Marcille Buffy, RN, BSN    Virtual Visit No    Medication changes reported     No    Fall or balance concerns reported    No    Tobacco Cessation No Change    Warm-up and Cool-down Performed as group-led instruction    Resistance Training Performed Yes    VAD Patient? No    PAD/SET Patient? No      Pain Assessment   Currently in Pain? No/denies    Pain Score 0-No pain    Multiple Pain Sites No             Capillary Blood Glucose: No results found for this or any previous visit (from the past 24 hour(s)).    Social History   Tobacco Use  Smoking Status Former   Packs/day: 2.00   Years: 42.00   Additional pack years: 0.00   Total pack years: 84.00   Types: Cigarettes   Start date: 12/18/1954   Quit date: 07/12/2002   Years since quitting: 20.8  Smokeless Tobacco Never  Tobacco Comments   Quit smoking for about 5 years - 1974-1979    Goals Met:  Proper associated with RPD/PD & O2 Sat Independence with exercise equipment Exercise tolerated well No report of concerns or symptoms today Strength training completed today  Goals Unmet:  Not Applicable  Comments: Service time is from 1011 to 1450.    Dr. Mechele Collin is Medical Director  for Pulmonary Rehab at [facility].

## 2023-05-08 ENCOUNTER — Ambulatory Visit (HOSPITAL_COMMUNITY): Payer: Medicare Other

## 2023-05-08 ENCOUNTER — Encounter (HOSPITAL_COMMUNITY)
Admission: RE | Admit: 2023-05-08 | Discharge: 2023-05-08 | Disposition: A | Discharge status: Home / Self Care | Payer: Medicare Other | Source: Ambulatory Visit | Attending: Pulmonary Disease | Admitting: Pulmonary Disease

## 2023-05-08 DIAGNOSIS — J449 Chronic obstructive pulmonary disease, unspecified: Secondary | ICD-10-CM | POA: Diagnosis not present

## 2023-05-08 NOTE — Progress Notes (Signed)
Daily Session Note  Patient Details  Name: Virginia [name] MRN: [id number] Date of Birth: 1941/04/30 Referring Provider:   Doristine Devoid Pulmonary Rehab Walk Test from 02/14/2023 in [facility]  Referring Provider Dewald       Encounter Date: 05/08/2023  Check In:  Session Check In - 05/08/23 1128       Check-In   Supervising physician immediately available to respond to emergencies [facility] MD immediately available    Physician(s) Bernadene Person, NP    Location [facility]    Staff Present Samantha Belarus, RD, Dutch Gray, RN, BSN;Randi Reeve BS, ACSM-CEP, Exercise Physiologist;Kaylee Earlene Plater, MS, ACSM-CEP, Exercise Physiologist;Casey Marcille Buffy, RN, BSN    Virtual Visit No    Medication changes reported     No    Fall or balance concerns reported    No    Tobacco Cessation No Change    Warm-up and Cool-down Performed as group-led instruction    Resistance Training Performed Yes    VAD Patient? No    PAD/SET Patient? No      Pain Assessment   Currently in Pain? No/denies    Multiple Pain Sites No             Capillary Blood Glucose: No results found for this or any previous visit (from the past 24 hour(s)).    Social History   Tobacco Use  Smoking Status Former   Packs/day: 2.00   Years: 42.00   Additional pack years: 0.00   Total pack years: 84.00   Types: Cigarettes   Start date: 12/18/1954   Quit date: 07/12/2002   Years since quitting: 20.8  Smokeless Tobacco Never  Tobacco Comments   Quit smoking for about 5 years - 1974-1979    Goals Met:  Independence with exercise equipment Exercise tolerated well No report of concerns or symptoms today Strength training completed today  Goals Unmet:  Not Applicable  Comments: Service time is from 1003 to 1152    Dr. Mechele Collin is Medical Director for Pulmonary Rehab at [facility].

## 2023-05-10 ENCOUNTER — Ambulatory Visit (HOSPITAL_COMMUNITY): Payer: Medicare Other

## 2023-05-10 ENCOUNTER — Encounter (HOSPITAL_COMMUNITY)
Admission: RE | Admit: 2023-05-10 | Discharge: 2023-05-10 | Disposition: A | Discharge status: Home / Self Care | Payer: Medicare Other | Source: Ambulatory Visit | Attending: Pulmonary Disease | Admitting: Pulmonary Disease

## 2023-05-10 ENCOUNTER — Telehealth: Payer: Self-pay

## 2023-05-10 DIAGNOSIS — J449 Chronic obstructive pulmonary disease, unspecified: Secondary | ICD-10-CM | POA: Diagnosis not present

## 2023-05-10 NOTE — Telephone Encounter (Signed)
Call to pt reference PREP referral.  Okay with cost of program. Would prefer [facility] location. Will have coach call her with class availability. Only missed 1 day of pulm rehab and looks forward to the structure of PREP.  Afternoon would work best for schedule.

## 2023-05-10 NOTE — Progress Notes (Signed)
Discharge Progress Report  Patient Details  Name: Virginia [name] MRN: [id number] Date of Birth: [birth year] Referring Provider:   Doristine Devoid Pulmonary Rehab Walk Test from 02/14/2023 in [facility]  Referring Provider Dewald        Number of Visits: 22  Reason for Discharge:  Patient has met program and personal goals.  Smoking History:  Social History   Tobacco Use  Smoking Status Former   Packs/day: 2.00   Years: 42.00   Additional pack years: 0.00   Total pack years: 84.00   Types: Cigarettes   Start date: 12/18/1954   Quit date: 07/12/2002   Years since quitting: 20.8  Smokeless Tobacco Never  Tobacco Comments   Quit smoking for about 5 years - 1974-1979    Diagnosis:  Stage 3 severe COPD by GOLD classification (HCC)  ADL [facility]:  Pulmonary Assessment Scores     Row Name 02/14/23 1433 05/08/23 1637       ADL [facility]   ADL Phase -- Exit    SOB Score total 64 43      CAT Score   CAT Score 16 18      mMRC Score   mMRC Score 3 --             Initial Exercise Prescription:  Initial Exercise Prescription - 02/14/23 1400       Date of Initial Exercise RX and Referring Provider   Date 02/14/23    Referring Provider Dewald    Expected Discharge Date 05/10/23      Oxygen   Oxygen Continuous    Liters 4    Maintain Oxygen Saturation 88% or higher      Recumbant Bike   Level 1    RPM 60    Minutes 15    METs 2      NuStep   Level 1    SPM 60    Minutes 15    METs 2      Prescription Details   Frequency (times per week) 2    Duration Progress to 30 minutes of continuous aerobic without signs/symptoms of physical distress      Intensity   THRR 40-80% of Max Heartrate 55-110    Ratings of Perceived Exertion 11-13    Perceived Dyspnea 0-4      Progression   Progression Continue to progress workloads to maintain intensity without signs/symptoms of physical distress.       Resistance Training   Training Prescription Yes    Weight red bands    Reps 10-15             Discharge Exercise Prescription (Final Exercise Prescription Changes):  Exercise Prescription Changes - 05/01/23 1200       Response to Exercise   Blood Pressure (Admit) 128/80    Blood Pressure (Exercise) 152/90    Blood Pressure (Exit) 118/74    Heart Rate (Admit) 82 bpm    Heart Rate (Exercise) 121 bpm    Heart Rate (Exit) 88 bpm    Oxygen Saturation (Admit) 99 %    Oxygen Saturation (Exercise) 93 %    Oxygen Saturation (Exit) 91 %    Rating of Perceived Exertion (Exercise) 13    Perceived Dyspnea (Exercise) 1    Duration Continue with 30 min of aerobic exercise without signs/symptoms of physical distress.    Intensity THRR unchanged      Progression   Progression Continue to progress  workloads to maintain intensity without signs/symptoms of physical distress.      Resistance Training   Training Prescription Yes    Weight red bands    Reps 10-15    Time 10 Minutes      Oxygen   Oxygen Continuous    Liters 4      NuStep   Level 3    SPM 60    Minutes 15    METs 2.8      Track   Laps 10    Minutes 15    METs 2.54             Functional Capacity:  6 Minute Walk     Row Name 02/14/23 1438 05/01/23 1536       6 Minute Walk   Phase Initial Discharge    Distance 960 feet 1510 feet    Distance % Change -- 57.29 %    Distance Feet Change -- 550 ft    Walk Time 6 minutes 6 minutes    # of Rest Breaks 0 0    MPH 1.82 2.86    METS 2.31 4.05    RPE 11 11    Perceived Dyspnea  1 2    VO2 Peak 8.1 14.18    Symptoms Yes (comment) No    Comments dyspnea on exertion --    Resting HR 107 bpm 94 bpm    Resting BP 132/76 124/72    Resting Oxygen Saturation  92 % 100 %    Exercise Oxygen Saturation  during 6 min walk 85 % 90 %    Max Ex. HR 120 bpm 144 bpm    Max Ex. BP 144/76 174/90    2 Minute Post BP 140/72 160/82      Interval HR   1 Minute HR 111 117     2 Minute HR 118  at 40sec HR 114 120    3 Minute HR 120 135    4 Minute HR 116 141    5 Minute HR 118 144    6 Minute HR 120 156    2 Minute Post HR 108 109    Interval Heart Rate? Yes Yes      Interval Oxygen   Interval Oxygen? Yes Yes    Baseline Oxygen Saturation % 92 % 100 %    1 Minute Oxygen Saturation % 87 %  31 sec O2 85% 97 %    1 Minute Liters of Oxygen 3 L  31 sec 2L, 1 min 3L 4 L    2 Minute Oxygen Saturation % 94 %  at 40sec O2 86% 94 %    2 Minute Liters of Oxygen 3 L  at 40sec increased to 4L 4 L    3 Minute Oxygen Saturation % 94 % 93 %    3 Minute Liters of Oxygen 4 L 4 L    4 Minute Oxygen Saturation % 92 % 90 %    4 Minute Liters of Oxygen 116 L 4 L    5 Minute Oxygen Saturation % 93 % 91 %    5 Minute Liters of Oxygen 118 L 4 L    6 Minute Oxygen Saturation % 93 % 90 %    6 Minute Liters of Oxygen 4 L 4 L    2 Minute Post Oxygen Saturation % 93 %  after O2 92% on RA 98 %    2 Minute Post Liters of  Oxygen 4 L 4 L             Psychological, QOL, Others - Outcomes: PHQ 2/9:    05/08/2023    4:34 PM 02/14/2023    2:29 PM 10/08/2015   12:00 PM  Depression screen PHQ 2/9  Decreased Interest 0 0 0  Down, Depressed, Hopeless 0 0 0  PHQ - 2 Score 0 0 0  Altered sleeping 0 0   Tired, decreased energy 0 1   Change in appetite 0 0   Feeling bad or failure about yourself  0 0   Trouble concentrating 0 0   Moving slowly or fidgety/restless 0 0   Suicidal thoughts 0 0   PHQ-9 Score 0 1   Difficult doing work/chores Not difficult at all Somewhat difficult     Quality of Life:   Personal Goals: Goals established at orientation with interventions provided to work toward goal.  Personal Goals and Risk Factors at Admission - 03/16/23 0950       Core Components/Risk Factors/Patient Goals on Admission    Weight Management Weight Maintenance    Improve shortness of breath with ADL's Yes    Intervention Provide education,  individualized exercise plan and daily activity instruction to help decrease symptoms of SOB with activities of daily living.    Expected Outcomes Short Term: Improve cardiorespiratory fitness to achieve a reduction of symptoms when performing ADLs;Long Term: Be able to perform more ADLs without symptoms or delay the onset of symptoms    Increase knowledge of respiratory medications and ability to use respiratory devices properly  Yes    Intervention Provide education and demonstration as needed of appropriate use of medications, inhalers, and oxygen therapy.    Expected Outcomes Short Term: Achieves understanding of medications use. Understands that oxygen is a medication prescribed by physician. Demonstrates appropriate use of inhaler and oxygen therapy.;Long Term: Maintain appropriate use of medications, inhalers, and oxygen therapy.              Personal Goals Discharge:  Goals and Risk Factor Review     Row Name 02/21/23 1316 03/16/23 0950 04/23/23 1511         Core Components/Risk Factors/Patient Goals Review   Personal Goals Review Develop more efficient breathing techniques such as purse lipped breathing and diaphragmatic breathing and practicing self-pacing with activity.;Increase knowledge of respiratory medications and ability to use respiratory devices properly.;Improve shortness of breath with ADL's Develop more efficient breathing techniques such as purse lipped breathing and diaphragmatic breathing and practicing self-pacing with activity.;Increase knowledge of respiratory medications and ability to use respiratory devices properly.;Improve shortness of breath with ADL's Develop more efficient breathing techniques such as purse lipped breathing and diaphragmatic breathing and practicing self-pacing with activity.;Increase knowledge of respiratory medications and ability to use respiratory devices properly.;Improve shortness of breath with ADL's;Weight Management/Obesity     Review  Virginia [name] has attended 1 class so far. She is currently exercising on the recumbent bike and the Nustep. Virginia [name] is practicing pursed lip breathing. We will continue to assess any progress pat makes throughout the program. Virginia [name] has attended 8 classes so far. She is currently exercising on the recumbent bike, Nustep, and walking the track depending on her pain due to scoliosis. She is making little improvement at this time. Virginia [name] is practicing pursed lip breathing but needs to be reminded to do so when short of breath or in pain. Virginia [name] has been maintaining her weight since starting the program. She knows how to  report her Rate of Perceived Exertion and her Dyspnea scale to staff. Virginia [name] has attended the sedentary lifestyle class and the pulmonary medications class. Virginia [name] has correctly stated when to use her inhaler and has properly demonstrated it with our respiratory therapist. Virginia [name] is achieving her goal of maintaining her weight. She has been working with our dietitian. (See dietician notes) Virginia [name] has met her goal of increasing her knowledge of respiratory meds and using them properly. She has correctly stated when to use her inhaler and has properly demonstrated it with our respiratory therapist. She has also met her goal of developing more efficient breathing techniques and self-pacing. Virginia [name] can initiate pursed lip breathing when she is short of breath without staff involvement. Virginia [name] is still working on improving her shortness of breath but feels like PR is helping.     Expected Outcomes See admission goals See admission goals See admission goals              Exercise Goals and Review:  Exercise Goals     Row Name 02/14/23 1350 02/22/23 1635 03/19/23 0838 04/26/23 1622       Exercise Goals   Increase Physical Activity Yes Yes Yes Yes    Intervention Provide advice, education, support and counseling about physical activity/exercise needs.;Develop an individualized exercise prescription for aerobic and resistive training  based on initial evaluation findings, risk stratification, comorbidities and participant's personal goals. Provide advice, education, support and counseling about physical activity/exercise needs.;Develop an individualized exercise prescription for aerobic and resistive training based on initial evaluation findings, risk stratification, comorbidities and participant's personal goals. Provide advice, education, support and counseling about physical activity/exercise needs.;Develop an individualized exercise prescription for aerobic and resistive training based on initial evaluation findings, risk stratification, comorbidities and participant's personal goals. Provide advice, education, support and counseling about physical activity/exercise needs.;Develop an individualized exercise prescription for aerobic and resistive training based on initial evaluation findings, risk stratification, comorbidities and participant's personal goals.    Expected Outcomes Short Term: Attend rehab on a regular basis to increase amount of physical activity.;Long Term: Exercising regularly at least 3-5 days a week.;Long Term: Add in home exercise to make exercise part of routine and to increase amount of physical activity. Short Term: Attend rehab on a regular basis to increase amount of physical activity.;Long Term: Exercising regularly at least 3-5 days a week.;Long Term: Add in home exercise to make exercise part of routine and to increase amount of physical activity. Short Term: Attend rehab on a regular basis to increase amount of physical activity.;Long Term: Exercising regularly at least 3-5 days a week.;Long Term: Add in home exercise to make exercise part of routine and to increase amount of physical activity. Short Term: Attend rehab on a regular basis to increase amount of physical activity.;Long Term: Exercising regularly at least 3-5 days a week.;Long Term: Add in home exercise to make exercise part of routine and to  increase amount of physical activity.    Increase Strength and Stamina Yes Yes Yes Yes    Intervention Provide advice, education, support and counseling about physical activity/exercise needs.;Develop an individualized exercise prescription for aerobic and resistive training based on initial evaluation findings, risk stratification, comorbidities and participant's personal goals. Provide advice, education, support and counseling about physical activity/exercise needs.;Develop an individualized exercise prescription for aerobic and resistive training based on initial evaluation findings, risk stratification, comorbidities and participant's personal goals. Provide advice, education, support and counseling about physical activity/exercise needs.;Develop an individualized exercise prescription for aerobic and resistive  training based on initial evaluation findings, risk stratification, comorbidities and participant's personal goals. Provide advice, education, support and counseling about physical activity/exercise needs.;Develop an individualized exercise prescription for aerobic and resistive training based on initial evaluation findings, risk stratification, comorbidities and participant's personal goals.    Expected Outcomes Short Term: Increase workloads from initial exercise prescription for resistance, speed, and METs.;Short Term: Perform resistance training exercises routinely during rehab and add in resistance training at home;Long Term: Improve cardiorespiratory fitness, muscular endurance and strength as measured by increased METs and functional capacity ( ) Short Term: Increase workloads from initial exercise prescription for resistance, speed, and METs.;Short Term: Perform resistance training exercises routinely during rehab and add in resistance training at home;Long Term: Improve cardiorespiratory fitness, muscular endurance and strength as measured by increased METs and functional capacity ( )  Short Term: Increase workloads from initial exercise prescription for resistance, speed, and METs.;Short Term: Perform resistance training exercises routinely during rehab and add in resistance training at home;Long Term: Improve cardiorespiratory fitness, muscular endurance and strength as measured by increased METs and functional capacity ( ) Short Term: Increase workloads from initial exercise prescription for resistance, speed, and METs.;Short Term: Perform resistance training exercises routinely during rehab and add in resistance training at home;Long Term: Improve cardiorespiratory fitness, muscular endurance and strength as measured by increased METs and functional capacity ( )    Able to understand and use rate of perceived exertion (RPE) scale Yes Yes Yes Yes    Intervention Provide education and explanation on how to use RPE scale Provide education and explanation on how to use RPE scale Provide education and explanation on how to use RPE scale Provide education and explanation on how to use RPE scale    Expected Outcomes Short Term: Able to use RPE daily in rehab to express subjective intensity level;Long Term:  Able to use RPE to guide intensity level when exercising independently Short Term: Able to use RPE daily in rehab to express subjective intensity level;Long Term:  Able to use RPE to guide intensity level when exercising independently Short Term: Able to use RPE daily in rehab to express subjective intensity level;Long Term:  Able to use RPE to guide intensity level when exercising independently Short Term: Able to use RPE daily in rehab to express subjective intensity level;Long Term:  Able to use RPE to guide intensity level when exercising independently    Able to understand and use Dyspnea scale Yes Yes Yes Yes    Intervention Provide education and explanation on how to use Dyspnea scale Provide education and explanation on how to use Dyspnea scale Provide education and explanation  on how to use Dyspnea scale Provide education and explanation on how to use Dyspnea scale    Expected Outcomes Short Term: Able to use Dyspnea scale daily in rehab to express subjective sense of shortness of breath during exertion;Long Term: Able to use Dyspnea scale to guide intensity level when exercising independently Short Term: Able to use Dyspnea scale daily in rehab to express subjective sense of shortness of breath during exertion;Long Term: Able to use Dyspnea scale to guide intensity level when exercising independently Short Term: Able to use Dyspnea scale daily in rehab to express subjective sense of shortness of breath during exertion;Long Term: Able to use Dyspnea scale to guide intensity level when exercising independently Short Term: Able to use Dyspnea scale daily in rehab to express subjective sense of shortness of breath during exertion;Long Term: Able to use Dyspnea scale to guide intensity level when exercising  independently    Intervention Provide education and explanation of THRR including how the numbers were predicted and where they are located for reference Provide education and explanation of THRR including how the numbers were predicted and where they are located for reference Provide education and explanation of THRR including how the numbers were predicted and where they are located for reference Provide education and explanation of THRR including how the numbers were predicted and where they are located for reference    Expected Outcomes Short Term: Able to state/look up THRR;Long Term: Able to use THRR to govern intensity when exercising independently;Short Term: Able to use daily as guideline for intensity in rehab Short Term: Able to state/look up THRR;Long Term: Able to use THRR to govern intensity when exercising independently;Short Term: Able to use daily as guideline for intensity in rehab Short Term: Able to state/look up THRR;Long Term: Able to use THRR to govern intensity  when exercising independently;Short Term: Able to use daily as guideline for intensity in rehab Short Term: Able to state/look up THRR;Long Term: Able to use THRR to govern intensity when exercising independently;Short Term: Able to use daily as guideline for intensity in rehab    Understanding of Exercise Prescription Yes Yes Yes Yes    Intervention Provide education, explanation, and written materials on patient's individual exercise prescription Provide education, explanation, and written materials on patient's individual exercise prescription Provide education, explanation, and written materials on patient's individual exercise prescription Provide education, explanation, and written materials on patient's individual exercise prescription    Expected Outcomes Short Term: Able to explain program exercise prescription;Long Term: Able to explain home exercise prescription to exercise independently Short Term: Able to explain program exercise prescription;Long Term: Able to explain home exercise prescription to exercise independently Short Term: Able to explain program exercise prescription;Long Term: Able to explain home exercise prescription to exercise independently Short Term: Able to explain program exercise prescription;Long Term: Able to explain home exercise prescription to exercise independently             Exercise Goals Re-Evaluation:  Exercise Goals Re-Evaluation     Row Name 02/22/23 1635 03/19/23 0838 04/26/23 1622         Exercise Goal Re-Evaluation   Exercise Goals Review Increase Physical Activity;Able to understand and use Dyspnea scale;Understanding of Exercise Prescription;Increase Strength and Stamina;Knowledge and understanding of Target Heart Rate Range (THRR);Able to understand and use rate of perceived exertion (RPE) scale Increase Physical Activity;Able to understand and use Dyspnea scale;Understanding of Exercise Prescription;Increase Strength and Stamina;Knowledge and  understanding of Target Heart Rate Range (THRR);Able to understand and use rate of perceived exertion (RPE) scale Increase Physical Activity;Able to understand and use Dyspnea scale;Understanding of Exercise Prescription;Increase Strength and Stamina;Knowledge and understanding of Target Heart Rate Range (THRR);Able to understand and use rate of perceived exertion (RPE) scale     Comments Virginia [name] has completed 2 exercise sessions. She exercises for 15 min on the recumbent bike and Nustep. Pat averages 2.5 METs at level 1 on the recumbent bike and 2.2 METs at level 2 on the Nustep. She performs the warmup and cooldown standing without limitations. Virginia [name] is deconditioned. It is too soon to notate any discernable progressions. Will continue to monitor and progress as able. Virginia [name] has completed 8 exercise sessions. She exercises for 15 min on the recumbent bike and track. Pat averages 2.2 METs at level 1.5 on the recumbent bike and 2.69 METs on the track. She performs the warmup and cooldown standing without limitations.  Virginia [name] has switched from exercising on the Nustep to walking the track due to her hip pain. Her chronic hip pain has limited her progression. Her level on the recumbent bike has not increased and her exercising time has been inconsistent. Will continue to monitor and progress as able. Virginia [name] has completed 18 exercise sessions. She exercises for 15 min on the track and Nustep. Pat averages 2.38 METs on the track and 1.9 METsat level 2 on the Nustep. Virginia [name] performs the warmup and cooldown standing without limitations. She has been slow to progress due to her chronic back and hip pain. She was switched from doing the recumbent bike to the Nustep. She enjoys track walking as it allows her to stretch her hip. I have discussed home exercise with Virginia [name] as I have encouraged her to walk around the grocery store. She seems motivated to exercise at home. Will continue to monitor and progress as able.     Expected Outcomes Through  exercise at rehab and home, the patient will decrease shortness of breath with daily activities and feel confident in carrying out an exercise regimen at home. Through exercise at rehab and home, the patient will decrease shortness of breath with daily activities and feel confident in carrying out an exercise regimen at home. Through exercise at rehab and home, the patient will decrease shortness of breath with daily activities and feel confident in carrying out an exercise regimen at home.              Nutrition & Weight - Outcomes:  Pre Biometrics - 02/14/23 1453       Pre Biometrics   Grip Strength 18 kg              Nutrition:  Nutrition Therapy & Goals - 04/17/23 1147       Nutrition Therapy   Diet General Healthy Diet      Personal Nutrition Goals   Nutrition Goal Patient to improve diet quality by using the plate method as a daily guide for meal planning to include lean protein/plant protein, fruits, vegetables, whole grains, nonfat dairy as part of well balanced diet    Personal Goal #2 Patient to identify strategies for weight maintanence/weight gain of 0.5-2.0# per week.    Comments Goals have not been met. Reviewed weight history and weight goals. She reports normal appetite and maintaining weight of ~124# over the last year. Her weight is down 4# since starting with our program. Reviewed strategies for weight gain including calorie density, increasing eating frequency, etc. Virginia [name] will benefit from participation in pulmonary rehab for nutrition, exercise, and lifestyle modification.      Intervention Plan   Intervention Prescribe, educate and counsel regarding individualized specific dietary modifications aiming towards targeted core components such as weight, hypertension, lipid management, diabetes, heart failure and other comorbidities.;Nutrition handout(s) given to patient.    Expected Outcomes Short Term Goal: Understand basic principles of dietary content, such as  calories, fat, sodium, cholesterol and nutrients.;Long Term Goal: Adherence to prescribed nutrition plan.             Nutrition Discharge:  Nutrition Assessments - 05/08/23 1454       Rate Your Plate Scores   Post Score 58             Education Questionnaire Score:  Knowledge Questionnaire Score - 05/08/23 1635       Knowledge Questionnaire Score   Post Score 18/18  Goals reviewed with patient; copy given to patient.

## 2023-05-10 NOTE — Progress Notes (Signed)
Daily Session Note  Patient Details  Name: Virginia [name] MRN: [id number] Date of Birth: 12-28-1940 Referring Provider:   Doristine Devoid Pulmonary Rehab Walk Test from 02/14/2023 in [facility]  Referring Provider Dewald       Encounter Date: 05/10/2023  Check In:  Session Check In - 05/10/23 1147       Check-In   Supervising physician immediately available to respond to emergencies [facility] MD immediately available    Physician(s) Robin Searing, NP    Location [facility]    Staff Present Samantha Belarus, RD, Dutch Gray, RN, BSN;Randi Reeve BS, ACSM-CEP, Exercise Physiologist;Kaylee Earlene Plater, MS, ACSM-CEP, Exercise Physiologist;Casey Marcille Buffy, RN, BSN    Virtual Visit No    Medication changes reported     No    Fall or balance concerns reported    No    Tobacco Cessation No Change    Warm-up and Cool-down Performed as group-led instruction    Resistance Training Performed Yes    VAD Patient? No    PAD/SET Patient? No      Pain Assessment   Currently in Pain? No/denies    Multiple Pain Sites No             Capillary Blood Glucose: No results found for this or any previous visit (from the past 24 hour(s)).    Social History   Tobacco Use  Smoking Status Former   Packs/day: 2.00   Years: 42.00   Additional pack years: 0.00   Total pack years: 84.00   Types: Cigarettes   Start date: 12/18/1954   Quit date: 07/12/2002   Years since quitting: 20.8  Smokeless Tobacco Never  Tobacco Comments   Quit smoking for about 5 years - 1974-1979    Goals Met:  Independence with exercise equipment Improved SOB with ADL's Exercise tolerated well No report of concerns or symptoms today Strength training completed today  Goals Unmet:  Not Applicable  Comments: Service time is from 1008 to 1203    Dr. Mechele Collin is Medical Director for Pulmonary Rehab at [facility].

## 2023-05-11 ENCOUNTER — Telehealth: Payer: Self-pay

## 2023-05-11 NOTE — Telephone Encounter (Signed)
Called to discuss PREP program schedule at [facility]; wants to attend next class at [facility] on June 18 every T/Th 10-11:15; will contact first week of June to set up assessment visit.

## 2023-05-24 ENCOUNTER — Telehealth: Payer: Self-pay

## 2023-05-24 NOTE — Telephone Encounter (Signed)
Called to confirm participation in next PREP class at [facility] on June 18, assessment visit scheduled for June 13 at 10am

## 2023-05-29 ENCOUNTER — Encounter: Payer: Self-pay | Admitting: Internal Medicine

## 2023-05-29 ENCOUNTER — Ambulatory Visit: Payer: Medicare Other | Attending: Internal Medicine | Admitting: Internal Medicine

## 2023-05-29 VITALS — BP 138/76 | HR 98 | Ht 65.0 in | Wt 113.4 lb

## 2023-05-29 DIAGNOSIS — I491 Atrial premature depolarization: Secondary | ICD-10-CM | POA: Diagnosis not present

## 2023-05-29 DIAGNOSIS — I471 Supraventricular tachycardia, unspecified: Secondary | ICD-10-CM

## 2023-05-29 DIAGNOSIS — I700 Atherosclerosis of aorta: Secondary | ICD-10-CM | POA: Insufficient documentation

## 2023-05-29 DIAGNOSIS — I493 Ventricular premature depolarization: Secondary | ICD-10-CM | POA: Insufficient documentation

## 2023-05-29 DIAGNOSIS — I2510 Atherosclerotic heart disease of native coronary artery without angina pectoris: Secondary | ICD-10-CM | POA: Insufficient documentation

## 2023-05-29 DIAGNOSIS — J4489 Other specified chronic obstructive pulmonary disease: Secondary | ICD-10-CM | POA: Diagnosis not present

## 2023-05-29 DIAGNOSIS — Z87891 Personal history of nicotine dependence: Secondary | ICD-10-CM

## 2023-05-29 DIAGNOSIS — J439 Emphysema, unspecified: Secondary | ICD-10-CM

## 2023-05-29 DIAGNOSIS — I2584 Coronary atherosclerosis due to calcified coronary lesion: Secondary | ICD-10-CM
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
## 2023-05-29 NOTE — Patient Instructions (Addendum)
Medication Instructions:  Your physician recommends that you continue on your current medications as directed. Please refer to the Current Medication list given to you today.  *If you need a refill on your cardiac medications before your next appointment, please call your pharmacy*   Lab Work: NONE If you have labs (blood work) drawn today and your tests are completely normal, you will receive your results only by: MyChart Message (if you have MyChart) OR A paper copy in the mail If you have any lab test that is abnormal or we need to change your treatment, we will call you to review the results.   Testing/Procedures: NONE   Follow-Up: At [facility], you and your health needs are our priority.  As part of our continuing mission to provide you with exceptional heart care, we have created designated Provider Care Teams.  These Care Teams include your primary Cardiologist (physician) and Advanced Practice Providers (APPs -  Physician Assistants and Nurse Practitioners) who all work together to provide you with the care you need, when you need it.    Your next appointment:   1 year(s)  Provider:   Riley Lam, MD, or Eligha Bridegroom, NP

## 2023-05-31 NOTE — Progress Notes (Signed)
[facility] PREP Evaluation  Patient Details  Name: Virginia [name] MRN: [id number] Date of Birth: 06-16-1941 Age: 82 y.o. PCP: Ralene Ok, MD  Vitals:   05/31/23 1050  BP: (!) 140/56  Pulse: 96  SpO2: 94%  Weight: 113 lb 12.8 oz (51.6 kg)     [facility] Eval - 05/31/23 1000       [facility] "PREP" Location   [facility] "PREP" Location [facility]      Referral    Referring Provider Pulm Rehab    Reason for referral Other   COPD/asthma   Program Start Date 06/05/23      Measurement   Waist Circumference 32 inches    Hip Circumference 38.5 inches    Body fat 41 percent      Information for Trainer   Goals --   Increase stamina; establish exercise and strength training program   Current Exercise --   walking   Orthopedic Concerns --   scoliosis   Pertinent Medical History --   COPD, asthma, osteopenia   Restrictions/Precautions Other   O2   Medications that affect exercise Asthma inhaler;Oxgen      Timed Up and Go (TUGS)   Timed Up and Go Low risk <9 seconds      Mobility and Daily Activities   I find it easy to walk up or down two or more flights of stairs. 2    I have no trouble taking out the trash. 2    I do housework such as vacuuming and dusting on my own without difficulty. 1    I can easily lift a gallon of milk (8lbs). 4    I can easily walk a mile. 1    I have no trouble reaching into high cupboards or reaching down to pick up something from the floor. 1    I do not have trouble doing out-door work such as Loss adjuster, chartered, raking leaves, or gardening. 1      Mobility and Daily Activities   I feel younger than my age. 4    I feel independent. 4    I feel energetic. 3    I live an active life.  2    I feel strong. 2    I feel healthy. 2    I feel active as other people my age. 3      How fit and strong are you.   Fit and Strong Total Score 32            Past Medical History:  Diagnosis Date   Allergic    Asthma    COPD (chronic obstructive pulmonary  disease) (HCC)    Osteopenia    Palpitations    No past surgical history on file. Social History   Tobacco Use  Smoking Status Former   Packs/day: 2.00   Years: 42.00   Additional pack years: 0.00   Total pack years: 84.00   Types: Cigarettes   Start date: 12/18/1954   Quit date: 07/12/2002   Years since quitting: 20.8  Smokeless Tobacco Never  Tobacco Comments   Quit smoking for about 5 years - 1974-1979  To begin PREP class at [facility] on June 18, every T/Th 10-11:15  Sonia Baller 05/31/2023, 10:54 AM

## 2023-06-05 ENCOUNTER — Telehealth: Payer: Self-pay | Admitting: Pulmonary Disease

## 2023-06-05 DIAGNOSIS — J439 Emphysema, unspecified: Secondary | ICD-10-CM

## 2023-06-05 NOTE — Telephone Encounter (Signed)
Patient would like new RX for Trelegy. Patient currently using Qvar and Anoro. Pharmacy is [facility] [address]. Patient phone number is [phone]. Please call after 11:30 am. Today.

## 2023-06-05 NOTE — Progress Notes (Signed)
[facility] PREP Weekly Session  Patient Details  Name: Tanessa Manygoats MRN: [id number] Date of Birth: 1941-07-10 Age: 82 y.o. PCP: Ralene Ok, MD  There were no vitals filed for this visit.   [facility] Weekly seesion - 06/05/23 1100       [facility] "PREP" Location   [facility] "PREP" Location [facility]      Weekly Session   Topic Discussed Goal setting and welcome to the program   introductions, review of notebook tour of facility; offered short intro to cardio machine w/option exercise   Classes attended to date 1             Rosalina Dingwall B Lilyanne Mcquown 06/05/2023, 11:39 AM

## 2023-06-06 MED ORDER — TRELEGY ELLIPTA 100-62.5-25 MCG/ACT IN AEPB: 1.0000 | INHALATION_SPRAY | Freq: Every day | RESPIRATORY_TRACT | 11 refills | Status: DC

## 2023-06-06 NOTE — Telephone Encounter (Signed)
Pt is requesting that Qvar and Anoro be replaced by Trelegy. Pt feels that Qvar in not very effective. Dr. Francine Graven please advise.

## 2023-06-06 NOTE — Telephone Encounter (Signed)
Anoro and Qvar discontinued and prescription sent for Trelegy ellipta 1 puff daily.  Thanks, JD

## 2023-06-06 NOTE — Telephone Encounter (Signed)
Spoke with patient. Advised Dr. Francine Graven has sent Trelegy prescription to her pharmacy.

## 2023-06-07 ENCOUNTER — Telehealth: Payer: Self-pay

## 2023-06-07 NOTE — Telephone Encounter (Signed)
She called to report she is not feeling well, was very fatigued after class Tuesday; states she wants to get a Rolator for her O2 and is currently adjusting COPD meds/inhalers. Discussed option of withdrawing from this PREP class and enrolling in next class in July. She is in agreement. Will contact next week to follow up and discuss options including whether this is a program that will fit her needs.

## 2023-06-11 ENCOUNTER — Telehealth: Payer: Self-pay

## 2023-06-11 NOTE — Telephone Encounter (Signed)
Called to discus participation in PREP class; she plans to use/bring her walker and wants to try to attend the class tomorrow.

## 2023-06-12 NOTE — Progress Notes (Signed)
[facility] PREP Weekly Session  Patient Details  Name: Virginia [name] MRN: [id number] Date of Birth: August 05, 1941 Age: 82 y.o. PCP: Ralene Ok, MD  There were no vitals filed for this visit.   [facility] Weekly seesion - 06/12/23 1100       [facility] "PREP" Location   [facility] "PREP" Location [facility]      Weekly Session   Topic Discussed Importance of resistance training;Other ways to be active   Cardio goal: work up to 150 minutes/wk; resistance training: work up to 2-3 times/wk for 20-40 minutes   Classes attended to date 2           Has decided she does want to continue with this class, every T/Th 10-11::15; encouraged her to make sure O2 portable tank is charged, bring her walker when she comes  Sonia Baller 06/12/2023, 11:41 AM

## 2023-06-19 NOTE — Progress Notes (Signed)
[facility] PREP Weekly Session  Patient Details  Name: Virginia [name] MRN: [id number] Date of Birth: 06-04-1941 Age: 82 y.o. PCP: Ralene Ok, MD  Vitals:   06/19/23 1136  Weight: 112 lb 3.2 oz (50.9 kg)     [facility] Weekly seesion - 06/19/23 1100       [facility] "PREP" Location   [facility] "PREP[facility] Location [facility]      Weekly Session   Topic Discussed Healthy eating tips   Foods to increase, foods to reduce; introduced YUKA app, eat the rainbow of colors   Minutes exercised this week 30 minutes    Classes attended to date 4             Marcial Pless B Dnasia Gauna 06/19/2023, 11:37 AM

## 2023-06-26 NOTE — Progress Notes (Signed)
[facility] PREP Weekly Session  Patient Details  Name: Virginia [name] MRN: [id number] Date of Birth: 1941-01-12 Age: 82 y.o. PCP: Ralene Ok, MD  Vitals:   06/26/23 1147  Weight: 113 lb (51.3 kg)     [facility] Weekly seesion - 06/26/23 1100       [facility] "PREP" Location   [facility] "PREP" Location [facility]      Weekly Session   Topic Discussed Health habits   Limit added sugars to 24 gms/women, 36 gms/men per day; Sugar demo   Minutes exercised this week 150 minutes    Classes attended to date 5             Marisela Line B Shaunie Boehm 06/26/2023, 11:47 AM

## 2023-07-02 ENCOUNTER — Telehealth: Payer: Self-pay | Admitting: Pulmonary Disease

## 2023-07-02 MED ORDER — BENZONATATE 200 MG PO CAPS: 200.0000 mg | ORAL_CAPSULE | Freq: Three times a day (TID) | ORAL | 1 refills | Status: DC | PRN

## 2023-07-02 MED ORDER — PREDNISONE 20 MG PO TABS: 20.0000 mg | ORAL_TABLET | Freq: Every day | ORAL | 0 refills | Status: DC

## 2023-07-02 NOTE — Telephone Encounter (Signed)
Prednisone 20 mg daily for 5 days. Benzonatate 200 mg 1 capsule Three times a day as needed for cough #30/0. She should also start flonase nasal spray for the sinus symptoms. This is over the counter. Needs f/u appt in October (not scheduled after last OV). If no improvement, she needs to call back or if symptoms worsen, go to UC or ED. Thanks.

## 2023-07-02 NOTE — Telephone Encounter (Signed)
Went over recommendations from [place] with patient. Prednisone and Tessalon pearls have been sent to pharmacy. NFN

## 2023-07-02 NOTE — Telephone Encounter (Signed)
Patient states having symptoms of cough, shortness of breath, and wheezing. Would like Prednisone. Pharmacy is [facility] [address]. Patient phone number is [phone].

## 2023-07-02 NOTE — Telephone Encounter (Signed)
Spoke with patient. She complains of dry cough, stuffy nose, SOB in general, wheezing. Symptoms started about a week ago. Patient hasn't tried anything OTC she wasn't sure what to take  Patient is requesting prednisone Pharmacy [facility] [address] please advise since Dr. Francine Graven is off

## 2023-07-17 ENCOUNTER — Telehealth: Payer: Self-pay
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
## 2023-07-20 ENCOUNTER — Telehealth: Payer: Self-pay | Admitting: Internal Medicine

## 2023-07-20 NOTE — Telephone Encounter (Signed)
Pt c/o medication issue:  1. Name of Medication:  Farxiga  2. How are you currently taking this medication (dosage and times per day)?   3. Are you having a reaction (difficulty breathing--STAT)?   4. What is your medication issue?   Patient states she has been put on Farxiga and she doesn't want to start it until she speaks with Dr. Izora Ribas. Patient prefers to speak directly with Dr. Izora Ribas if possible.

## 2023-07-22 ENCOUNTER — Encounter: Payer: Self-pay | Admitting: Internal Medicine

## 2023-07-24 NOTE — Telephone Encounter (Signed)
Unable to leave a message voice mail not set up

## 2023-08-01 NOTE — Telephone Encounter (Signed)
Called pt to f/u concern regarding Farxiga. Reports PCP prescribed Farxiga for kidney protection d/t CKD.  Pt did not start medication d/t potential s/e.  Reports uses O2 and sits a lot which cause moisture which would put pt at increased risk for UTI/ yeast infection. Also expresses can cause high HR pt already has HR issues. Does not like the s/e associated with [place].  Wants to know MD thoughts and if he thinks there is another medication she can take instead of [place]. Will send to MD to advise.

## 2023-08-09 ENCOUNTER — Telehealth: Payer: Self-pay | Admitting: Pulmonary Disease

## 2023-08-09 NOTE — Telephone Encounter (Signed)
Patient has a cough and would like to see Dr.Dewald. Next available appoint was offered (9/23). She wants to be seen sooner but only by Dr.Dewald.She would like a call [phone] or [phone].

## 2023-08-13 ENCOUNTER — Other Ambulatory Visit: Payer: Self-pay | Admitting: Pulmonary Disease

## 2023-08-16 NOTE — Telephone Encounter (Signed)
Attempted to call patient no answer or voicemail option.

## 2023-08-20 NOTE — Progress Notes (Addendum)
Virginia [name], female    DOB: [birth year]   MRN: [id number]   Brief patient profile:  82 yowf quit smoking 2003 with sinus problems since age 82 self  referred to pulmonary clinic 08/21/2023 for acute visit with need for inhalers x 2019 mostly with exertion (Golf) with pos allergy testing  remotely  yowf quit smoking 2003 with sinus problems since age 82 self  referred to pulmonary clinic 08/21/2023 for acute visit with need for inhalers x 2019 mostly with exertion (Golf) with pos allergy testing  remotely   Dr Francine Graven pt PFTS 2021 showed ratio 41%, FEV1 0.81L (40%), FVC 1.88L (69%), no significant bronchodilator response. DLCO 33%    History of Present Illness  08/21/2023  Pulmonary/ 1st office eval/Mendy Lapinsky on Trelegy had been anoro  Chief Complaint  Patient presents with   Acute Visit    Cough x 6 weeks.  Prednisone called in by APP did not help sx.  Dyspnea:  pushes cart fine hamricks Cough: dry raspy feels something stuck in throat < one tsp  Sleep: fine flat bed  SABA use: last used pm one day prior to eval   No obvious day to day or daytime pattern/variability or assoc excess/ purulent sputum or mucus plugs or hemoptysis or cp or chest tightness, subjective wheeze or overt sinus or hb symptoms.    Also denies any obvious fluctuation of symptoms with weather or environmental changes or other aggravating or alleviating factors except as outlined above   No unusual exposure hx or h/o childhood pna/ asthma or knowledge of premature birth.  Current Allergies, Complete Past Medical History, Past Surgical History, Family History, and Social History were reviewed in [name] record.  ROS  The following are not active complaints unless bolded Hoarseness, sore throat, dysphagia, dental problems, itching, sneezing,  nasal congestion or discharge of sense of excess mucus or purulent secretions, ear ache,   fever, chills, sweats, unintended wt loss or wt gain, classically pleuritic or exertional cp,  orthopnea pnd or arm/hand swelling  or leg swelling, presyncope, palpitations, abdominal pain, anorexia, nausea, vomiting, diarrhea  or change in bowel habits or change in bladder  habits, change in stools or change in urine, dysuria, hematuria,  rash, arthralgias, visual complaints, headache, numbness, weakness or ataxia or problems with walking or coordination,  change in mood or  memory.             Outpatient Medications Prior to Visit  Medication Sig Dispense Refill   albuterol (VENTOLIN HFA) 108 (90 Base) MCG/ACT inhaler Inhale 1 puff into the lungs every 6 (six) hours as needed for wheezing or shortness of breath.     benzonatate (TESSALON) 200 MG capsule Take 1 capsule (200 mg total) by mouth 3 (three) times daily as needed for cough. 30 capsule 1   calcium carbonate (TUMS - DOSED IN MG ELEMENTAL CALCIUM) 500 MG chewable tablet Chew 1 tablet by mouth daily.     chlorpheniramine (CHLOR-TRIMETON) 4 MG tablet Take 4 mg by mouth at bedtime.     dextromethorphan-guaiFENesin (MUCINEX DM) 30-600 MG 12hr tablet Take 1 tablet by mouth daily at 6 (six) AM.     fexofenadine (ALLEGRA) 180 MG tablet Take 180 mg by mouth daily.     fluticasone (FLONASE ALLERGY RELIEF) 50 MCG/ACT nasal spray Place 1 spray into both nostrils as needed for allergies.     Fluticasone-Umeclidin-Vilant (TRELEGY ELLIPTA) 100-62.5-25 MCG/ACT AEPB Inhale 1 puff into the lungs daily. 28 each 11   latanoprost (XALATAN) 0.005 % ophthalmic solution 1 drop at bedtime.     magnesium oxide (MAG-OX) 400 MG tablet Take 400 mg by mouth daily.     montelukast (SINGULAIR) 10 MG tablet  TAKE 1 TABLET(10 MG) BY MOUTH AT BEDTIME 30 tablet 5   predniSONE (DELTASONE) 20 MG tablet  Finished      triamcinolone ointment (KENALOG) 0.1 % Apply 1 Application topically 2 (two) times daily as needed (rash flare). Do not use on the face, neck, armpits or groin area. Do not use more than 3 weeks in a row. (Patient not taking: Reported on 05/29/2023) 30 g 1       Past Medical History:  Diagnosis Date   Allergic    Asthma    COPD (chronic obstructive pulmonary disease) (HCC)    Osteopenia    Palpitations        Objective:     BP 134/82 (BP Location: Left Arm, Patient Position: Sitting, Cuff Size: Normal)   Pulse 98   Temp 97.8 F (36.6 C) (Oral)   Ht 5\' 5"  (1.651 m)   Wt 112 lb 9.6 oz (51.1 kg)   SpO2 95% Comment: room air  BMI 18.74 kg/m   SpO2: 95 % (room air) slt hoarse amb wf nad   HEENT : Oropharynx  clear  / full dentures/ no thrush  Nasal turbinates nl    NECK :  without  apparent JVD/ palpable Nodes/TM    LUNGS: no acc muscle use,  Mod scoliosis and barrel  contour chest wall with bilateral  Distant bs s audible wheeze and  without cough on insp or exp maneuvers  and mild  Hyperresonant  to  percussion bilaterally     CV:  RRR  no s3 or murmur or increase in P2, and no edema   ABD:  soft and nontender with pos end  insp Hoover's  in the supine position.  No bruits or organomegaly appreciated   MS:  Nl gait/ ext warm without deformities Or obvious joint restrictions  calf tenderness, cyanosis or clubbing     SKIN: warm and dry without lesions    NEURO:  alert, approp, nl sensorium with  no motor or cerebellar deficits apparent.    CXR PA and Lateral:   08/21/2023 :    I personally reviewed images and impression is as follows:     Copd/ scoliosis/ no acute findings   ADD:  radiology concerned about subtle shadow RUL > rec CT chest     Assessment   No problem-specific Assessment & Plan notes found for this encounter.     Sandrea Hughs, MD 08/21/2023

## 2023-08-21 ENCOUNTER — Encounter: Payer: Self-pay | Admitting: Internal Medicine

## 2023-08-21 ENCOUNTER — Ambulatory Visit: Payer: Medicare Other | Admitting: Internal Medicine

## 2023-08-21 ENCOUNTER — Ambulatory Visit (INDEPENDENT_AMBULATORY_CARE_PROVIDER_SITE_OTHER): Payer: Medicare Other

## 2023-08-21 ENCOUNTER — Telehealth: Payer: Self-pay | Admitting: Internal Medicine

## 2023-08-21 VITALS — BP 134/82 | HR 98 | Temp 97.8°F | Ht 65.0 in | Wt 112.6 lb

## 2023-08-21 DIAGNOSIS — J441 Chronic obstructive pulmonary disease with (acute) exacerbation: Secondary | ICD-10-CM

## 2023-08-21 DIAGNOSIS — J45901 Unspecified asthma with (acute) exacerbation: Secondary | ICD-10-CM | POA: Diagnosis not present

## 2023-08-21 MED ORDER — PREDNISONE 10 MG PO TABS: ORAL_TABLET | ORAL | 0 refills | Status: DC

## 2023-08-21 MED ORDER — ASMANEX HFA 100 MCG/ACT IN AERO: INHALATION_SPRAY | RESPIRATORY_TRACT | 11 refills | Status: DC

## 2023-08-21 MED ORDER — ANORO ELLIPTA 62.5-25 MCG/ACT IN AEPB: 1.0000 | INHALATION_SPRAY | Freq: Every day | RESPIRATORY_TRACT | 11 refills | Status: DC

## 2023-08-21 NOTE — Telephone Encounter (Signed)
Upon check out patient is needing to be seen in 2 weeks by an NP, unfortunately there are no appts available. Please advise patient.

## 2023-08-21 NOTE — Telephone Encounter (Signed)
3 weeks fine then

## 2023-08-21 NOTE — Patient Instructions (Addendum)
Plan A = Automatic = Always=    Anoro one click each am then Either qvar or asmanex 100 2 puffs each AM  Prednisone 10 mg take  4 each am x 2 days,   2 each am x 2 days,  1 each am x 2 days and stop    Plan B = Backup (to supplement plan A, not to replace it) Only use your albuterol inhaler as a rescue medication to be used if you can't catch your breath by resting or doing a relaxed purse lip breathing pattern.  - The less you use it, the better it will work when you need it. - Ok to use the inhaler up to 2 puffs  every 4 hours if you must but call for appointment if use goes up over your usual need - Don't leave home without it !!  (think of it like the spare tire for your car)     Work on inhaler technique:  relax and gently blow all the way out then take a nice smooth full deep breath back in, triggering the inhaler at same time you start breathing in.  Hold breath in for at least  5 seconds if you can. Blow out qvar/asmanex  thru nose. Rinse and gargle with water when done.  If mouth or throat bother you at all,  try brushing teeth/gums/tongue with arm and hammer toothpaste/ make a slurry and gargle and spit out.   Please remember to go to the  x-ray department  for your tests - we will call you with the results when they are available    ADD:  radiology concerned about subtle shadow RUL > rec CT chest    Return to clinic in 2 weeks for recheck by NP

## 2023-08-22 ENCOUNTER — Other Ambulatory Visit (HOSPITAL_COMMUNITY): Payer: Self-pay

## 2023-08-22 ENCOUNTER — Telehealth: Payer: Self-pay

## 2023-08-22 NOTE — Telephone Encounter (Signed)
Pharmacy Patient Advocate Encounter   Received notification from CoverMyMeds that prior authorization for Asmanex HFA 100MCG/ACT aerosol is required/requested.   Insurance verification completed.   The patient is insured through  [employer] Medicare Part D  .   Per test claim: PA required; PA submitted to [employer] Medicare Part D via CoverMyMeds Key/confirmation #/[id number] [facility] Status is pending

## 2023-08-22 NOTE — Assessment & Plan Note (Addendum)
Group D (now reclassified as E) in terms of symptom/risk and laba/lama/ICS  therefore appropriate rx at this point >>>  trelegy approp but having problems with upper airway irritation(hoarseness/cough)  with sensation of globus sensation s any adverse effect of ex tol p started trelegy so rec :  Prednisone 10 mg take  4 each am x 2 days,   2 each am x 2 days,  1 each am x 2 days and stop   Change back to anoro and take qvar or asmanex 100 2 puffs qam to see if improves her upper airway symptoms  the other option budesonide 0.25 mg per neb bid   Discussed in detail all the  indications, usual  risks and alternatives  relative to the benefits with patient who agrees to proceed with Rx as outlined.      F/u in 2-3 weeks with NP if Dr Lanora Manis schdule is full.         Each maintenance medication was reviewed in detail including emphasizing most importantly the difference between maintenance and prns and under what circumstances the prns are to be triggered using an action plan format where appropriate.  Total time for H and P, chart review, counseling, reviewing hfa/dpi device(s) and generating customized AVS unique to this ACUTE office visit / same day charting = 40 min with pt new to me

## 2023-08-23 ENCOUNTER — Telehealth: Payer: Self-pay | Admitting: Internal Medicine

## 2023-08-23 ENCOUNTER — Encounter: Payer: Self-pay | Admitting: Internal Medicine

## 2023-08-23 NOTE — Telephone Encounter (Signed)
That's fine

## 2023-08-23 NOTE — Telephone Encounter (Signed)
Message addressed via mychart. Nothing further needed.

## 2023-08-23 NOTE — Telephone Encounter (Signed)
Patient is calling wanting to speak with a nurse. She currently has azmnex. She needs to know if the generic brand is okay for her to take because that is what her pharmacy has. Please call and advise as soon as possible [phone].

## 2023-08-27 ENCOUNTER — Telehealth: Payer: Self-pay | Admitting: Internal Medicine

## 2023-08-27 NOTE — Telephone Encounter (Signed)
Patient states can't get Azmnex from any of the pharmacy's. States retail is too expensive. Patient phone number is [phone] and [phone].

## 2023-08-29 NOTE — Telephone Encounter (Signed)
Patient is returning phone call. Patient phone number is [phone].

## 2023-08-29 NOTE — Telephone Encounter (Signed)
Even with Good rx its over $100. Is there a different medication?

## 2023-08-29 NOTE — Telephone Encounter (Signed)
Until she returns can just stay on anoro and if breathing/ coughing worsen just take prednisone 10 mg 2 each am until better then 1 daily x 3 days and stop  Give #60 with on refill  Ov with drug formulary in hand

## 2023-08-30 ENCOUNTER — Other Ambulatory Visit (HOSPITAL_COMMUNITY): Payer: Self-pay

## 2023-08-30 NOTE — Telephone Encounter (Signed)
Results have been relayed to the patient/authorized caretaker. The patient/authorized caretaker verbalized understanding. No questions at this time.   

## 2023-08-30 NOTE — Telephone Encounter (Signed)
Patient states that it is too expensive. Per provider she will stay on anoro until her follow up appt.

## 2023-08-30 NOTE — Telephone Encounter (Signed)
Pharmacy Patient Advocate Encounter  Received notification from [facility] that Prior Authorization for Asmanex HFA 100MCG/ACT aerosol has been APPROVED from 08-22-2023 to 12-18-2023. Ran test claim, Copay is $100.00. This test claim was processed through [facility]- copay amounts may vary at other pharmacies due to pharmacy/plan contracts, or as the patient moves through the different stages of their insurance plan.   PA #/Case ID/Reference #: [facility]

## 2023-09-09 NOTE — Progress Notes (Unsigned)
Virginia [name], female    DOB: 12/28/1940   MRN: [id number]   Brief patient profile:  82  yowf quit smoking 2003 with sinus problems since age 82 self  referred to pulmonary clinic 08/21/2023 for acute visit with need for inhalers x 2019 mostly with exertion (Golf) with pos allergy testing  remotely   Dr Francine Graven pt PFTS 2021 showed ratio 41%, FEV1 0.81L (40%), FVC 1.88L (69%), no significant bronchodilator response. DLCO 33%    History of Present Illness  08/21/2023  Pulmonary/ 1st [name] eval/[place]ia [name] on Trelegy had been anoro  Chief Complaint  Patient presents with   Acute Visit    Cough x 6 weeks.  Prednisone called in by APP did not help sx.  Dyspnea:  pushes cart fine hamricks Cough: dry raspy feels something stuck in throat < one tsp  Sleep: fine flat bed  SABA use: last used pm one day prior to eval  Rec Plan A = Automatic = Always=    Anoro one click each am then Either qvar or asmanex 100 2 puffs each AM Prednisone 10 mg take  4 each am x 2 days,   2 each am x 2 days,  1 each am x 2 days and stop  Plan B = Backup (to supplement plan A, not to replace it) Only use your albuterol inhaler as a rescue medication  Work on inhaler technique: Please remember to go to the  x-ray department  for your tests - we will call you with the results when they are available     09/10/2023  f/u ov/Virginia [name]/Virginia [name] re: GOLD 3  maint on anoro /singulair  need f/u cxr or   Chief Complaint  Patient presents with   Follow-up    6 month follow up  Dyspnea:  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 on a flat grade s sob but does fine slow and flat  Cough: improved p prednisone and has not recurred off trelegy  Sleeping: flat bed on pillow s  resp cc  SABA use: 3 x daily  02: 3lpm HS  and not titrating daytime Hoarseness better off trelegy but hasn't started qvar yet    No obvious day to day or daytime variability or assoc excess/ purulent sputum or mucus plugs or hemoptysis or cp or chest tightness,  subjective wheeze or overt sinus or hb symptoms.    Also denies any obvious fluctuation of symptoms with weather or environmental changes or other aggravating or alleviating factors except as outlined above   No unusual exposure hx or h/o childhood pna/ asthma or knowledge of premature birth.  Current Allergies, Complete Past Medical History, Past Surgical History, Family History, and Social History were reviewed in [name] record.  ROS  The following are not active complaints unless bolded Hoarseness, sore throat, dysphagia, dental problems, itching, sneezing,  nasal congestion or discharge of excess mucus or purulent secretions, ear ache,   fever, chills, sweats, unintended wt loss or wt gain, classically pleuritic or exertional cp,  orthopnea pnd or arm/hand swelling  or leg swelling, presyncope, palpitations, abdominal pain, anorexia, nausea, vomiting, diarrhea  or change in bowel habits or change in bladder habits, change in stools or change in urine, dysuria, hematuria,  rash, arthralgias, visual complaints, headache, numbness, weakness or ataxia or problems with walking or coordination,  change in mood or  memory.        Current Meds  Medication Sig   albuterol (VENTOLIN HFA) 108 (90  Base) MCG/ACT inhaler Inhale 1 puff into the lungs every 6 (six) hours as needed for wheezing or shortness of breath.   benzonatate (TESSALON) 200 MG capsule Take 1 capsule (200 mg total) by mouth 3 (three) times daily as needed for cough.   chlorpheniramine (CHLOR-TRIMETON) 4 MG tablet Take 4 mg by mouth at bedtime.   dextromethorphan-guaiFENesin (MUCINEX DM) 30-600 MG 12hr tablet Take 1 tablet by mouth daily at 6 (six) AM.   fexofenadine (ALLEGRA) 180 MG tablet Take 180 mg by mouth daily.   latanoprost (XALATAN) 0.005 % ophthalmic solution 1 drop at bedtime.   magnesium oxide (MAG-OX) 400 MG tablet Take 400 mg by mouth daily.   Mometasone Furoate (ASMANEX HFA) 100 MCG/ACT AERO 2  puffs each am   triamcinolone ointment (KENALOG) 0.1 % Apply 1 Application topically 2 (two) times daily as needed (rash flare). Do not use on the face, neck, armpits or groin area. Do not use more than 3 weeks in a row.   umeclidinium-vilanterol (ANORO ELLIPTA) 62.5-25 MCG/ACT AEPB Inhale 1 puff into the lungs daily.            Past Medical History:  Diagnosis Date   Allergic    Asthma    COPD (chronic obstructive pulmonary disease) (HCC)    Osteopenia    Palpitations       Objective:     Wt Readings from Last 3 Encounters:  09/10/23 117 lb (53.1 kg)  08/21/23 112 lb 9.6 oz (51.1 kg)  06/26/23 113 lb (51.3 kg)      Vital signs reviewed  09/10/2023  - Note at rest 02 sats  92% on RA   General appearance:    amb thin pleasant f nad   HEENT :  Oropharynx  clear      NECK :  without JVD/Nodes/TM/ nl carotid upstrokes bilaterally   LUNGS: no acc muscle use,  Mod scoliotic and barrel  contour chest wall with bilateral  Distant   wheeze and  without cough on insp or exp maneuvers and mod  Hyperresonant  to  percussion bilaterally     CV:  RRR  no s3 or murmur or increase in P2, and no edema   ABD:  soft and nontender with pos mid insp Hoover's  in the supine position. No bruits or organomegaly appreciated, bowel sounds nl  MS:   Ext warm without deformities or   obvious joint restrictions , calf tenderness, cyanosis or clubbing  SKIN: warm and dry without lesions    NEURO:  alert, approp, nl sensorium with  no motor or cerebellar deficits apparent.       CXR PA and Lateral:   09/10/2023 :    I personally reviewed images and impression is as follows:     No nodules seen/ severe copd pattern     Assessment

## 2023-09-10 ENCOUNTER — Encounter: Payer: Self-pay | Admitting: Internal Medicine

## 2023-09-10 ENCOUNTER — Ambulatory Visit: Payer: Medicare Other | Admitting: Internal Medicine

## 2023-09-10 ENCOUNTER — Ambulatory Visit (HOSPITAL_COMMUNITY)
Admission: RE | Admit: 2023-09-10 | Discharge: 2023-09-10 | Disposition: A | Discharge status: Home / Self Care | Payer: Medicare Other | Source: Ambulatory Visit | Attending: Internal Medicine | Admitting: Internal Medicine

## 2023-09-10 VITALS — BP 142/67 | HR 99 | Ht 65.0 in | Wt 117.0 lb

## 2023-09-10 DIAGNOSIS — J9611 Chronic respiratory failure with hypoxia: Secondary | ICD-10-CM | POA: Insufficient documentation

## 2023-09-10 DIAGNOSIS — J449 Chronic obstructive pulmonary disease, unspecified: Secondary | ICD-10-CM | POA: Diagnosis present

## 2023-09-10 MED ORDER — PREDNISONE 10 MG PO TABS: ORAL_TABLET | ORAL | 0 refills | Status: DC

## 2023-09-10 NOTE — Assessment & Plan Note (Signed)
As of 09/10/2023  using 02 3lpm and prn daytime but not titrating with exertion   Make sure you check your oxygen saturation  AT  your highest level of activity (not after you stop)   to be sure it stays over 90% and adjust  02 flow upward to maintain this level if needed but remember to turn it back to previous settings when you stop (to conserve your supply).          Each maintenance medication was reviewed in detail including emphasizing most importantly the difference between maintenance and prns and under what circumstances the prns are to be triggered using an action plan format where appropriate.  Total time for H and P, chart review, counseling, reviewing hfa/dpi/02 / pulse ox  device(s) and generating customized AVS unique to this office visit / same day charting = 42 min on teaching how to use inhalers and 02 correctly.

## 2023-09-10 NOTE — Patient Instructions (Addendum)
Make sure you check your oxygen saturation  AT  your highest level of activity (not after you stop)   to be sure it stays over 90% and adjust  02 flow upward to maintain this level if needed but remember to turn it back to previous settings when you stop (to conserve your supply).    Plan A = Automatic = Always=    Anoro one click  each am and qvar 2 puffs to follow   Work on inhaler technique:  relax and gently blow all the way out then take a nice smooth full deep breath back in.  Hold breath in for at least  5 seconds if you can. Blow out qvar  thru nose. Rinse and gargle with water when done.  If mouth or throat bother you at all,  try brushing teeth/gums/tongue with arm and hammer toothpaste/ make a slurry and gargle and spit out.       Plan B = Backup (to supplement plan A, not to replace it) Only use your albuterol inhaler as a rescue medication to be used if you can't catch your breath by resting or doing a relaxed purse lip breathing pattern.  - The less you use it, the better it will work when you need it. - Ok to use the inhaler up to 2 puffs  every 4 hours if you must but call for appointment if use goes up over your usual need - Don't leave home without it !!  (think of it like the spare tire for your car)   Plan C = Crisis (instead of Plan B but only if Plan B stops working) - Prednisone 10 mg take  4 each am x 2 days,   2 each am x 2 days,  1 each am x 2 days and stop   Please remember to go to the  x-ray department  @  [facility] for your tests - we will call you with the results when they are available      Please schedule a follow up visit in 3 months but call sooner if needed - bring inhalers - if it's a morning appt ok to wait to take until we see you .

## 2023-09-10 NOTE — Assessment & Plan Note (Signed)
Quit smoking 2003 - PFTS 2021 showed ratio 41%, FEV1 0.81L (40%), FVC 1.88L (69%), no significant bronchodilator response. DLCO 33%  - 09/10/2023  After extensive coaching inhaler device,  effectiveness =    60% (early trigger) >  anoro/qvar if tol   Group D (now reclassified as E) in terms of symptom/risk and laba/lama/ICS  therefore appropriate rx at this point >>>  can't tol trelegy upper airway side effects so best bet = qvar trial as she already has this at home plus approp saba:  Re SABA :  I spent extra time with pt today reviewing appropriate use of albuterol for prn use on exertion with the following points: 1) saba is for relief of sob that does not improve by walking a slower pace or resting but rather if the pt does not improve after trying this first. 2) If the pt is convinced, as many are, that saba helps recover from activity faster then it's easy to tell if this is the case by re-challenging : ie stop, take the inhaler, then p 5 minutes try the exact same activity (intensity of workload) that just caused the symptoms and see if they are substantially diminished or not after saba 3) if there is an activity that reproducibly causes the symptoms, try the saba 15 min before the activity on alternate days   If in fact the saba really does help, then fine to continue to use it prn but advised may need to look closer at the maintenance regimen being used to achieve better control of airways disease with exertion.

## 2023-10-01 ENCOUNTER — Other Ambulatory Visit: Payer: Self-pay | Admitting: Nurse Practitioner

## 2023-10-01 DIAGNOSIS — J4489 Other specified chronic obstructive pulmonary disease: Secondary | ICD-10-CM

## 2023-10-02 ENCOUNTER — Telehealth: Payer: Self-pay

## 2023-10-02 NOTE — Telephone Encounter (Signed)
-----   Message from Sandrea Hughs sent at 09/30/2023  6:23 AM EDT ----- Call pt:  Reviewed cxr and no acute change so no change in recommendations made at [facility]

## 2023-10-02 NOTE — Telephone Encounter (Signed)
Called pt  hung up back she thought it was spam call , I tried calling patient back she didn't answer and she doesn't have a vm set up . Called and lvm for her Virginia [name] on her DPR to  call us back regarding her results.

## 2023-11-02 IMAGING — DX DG CHEST 2V
2 series · 2 of 2 positions shown · non-contrast
Comparison: 12/08/2020

CLINICAL DATA: Worsening cough, fevers. COVID. Shortness of breath

EXAM:
CHEST - 2 VIEW

[chest pa]
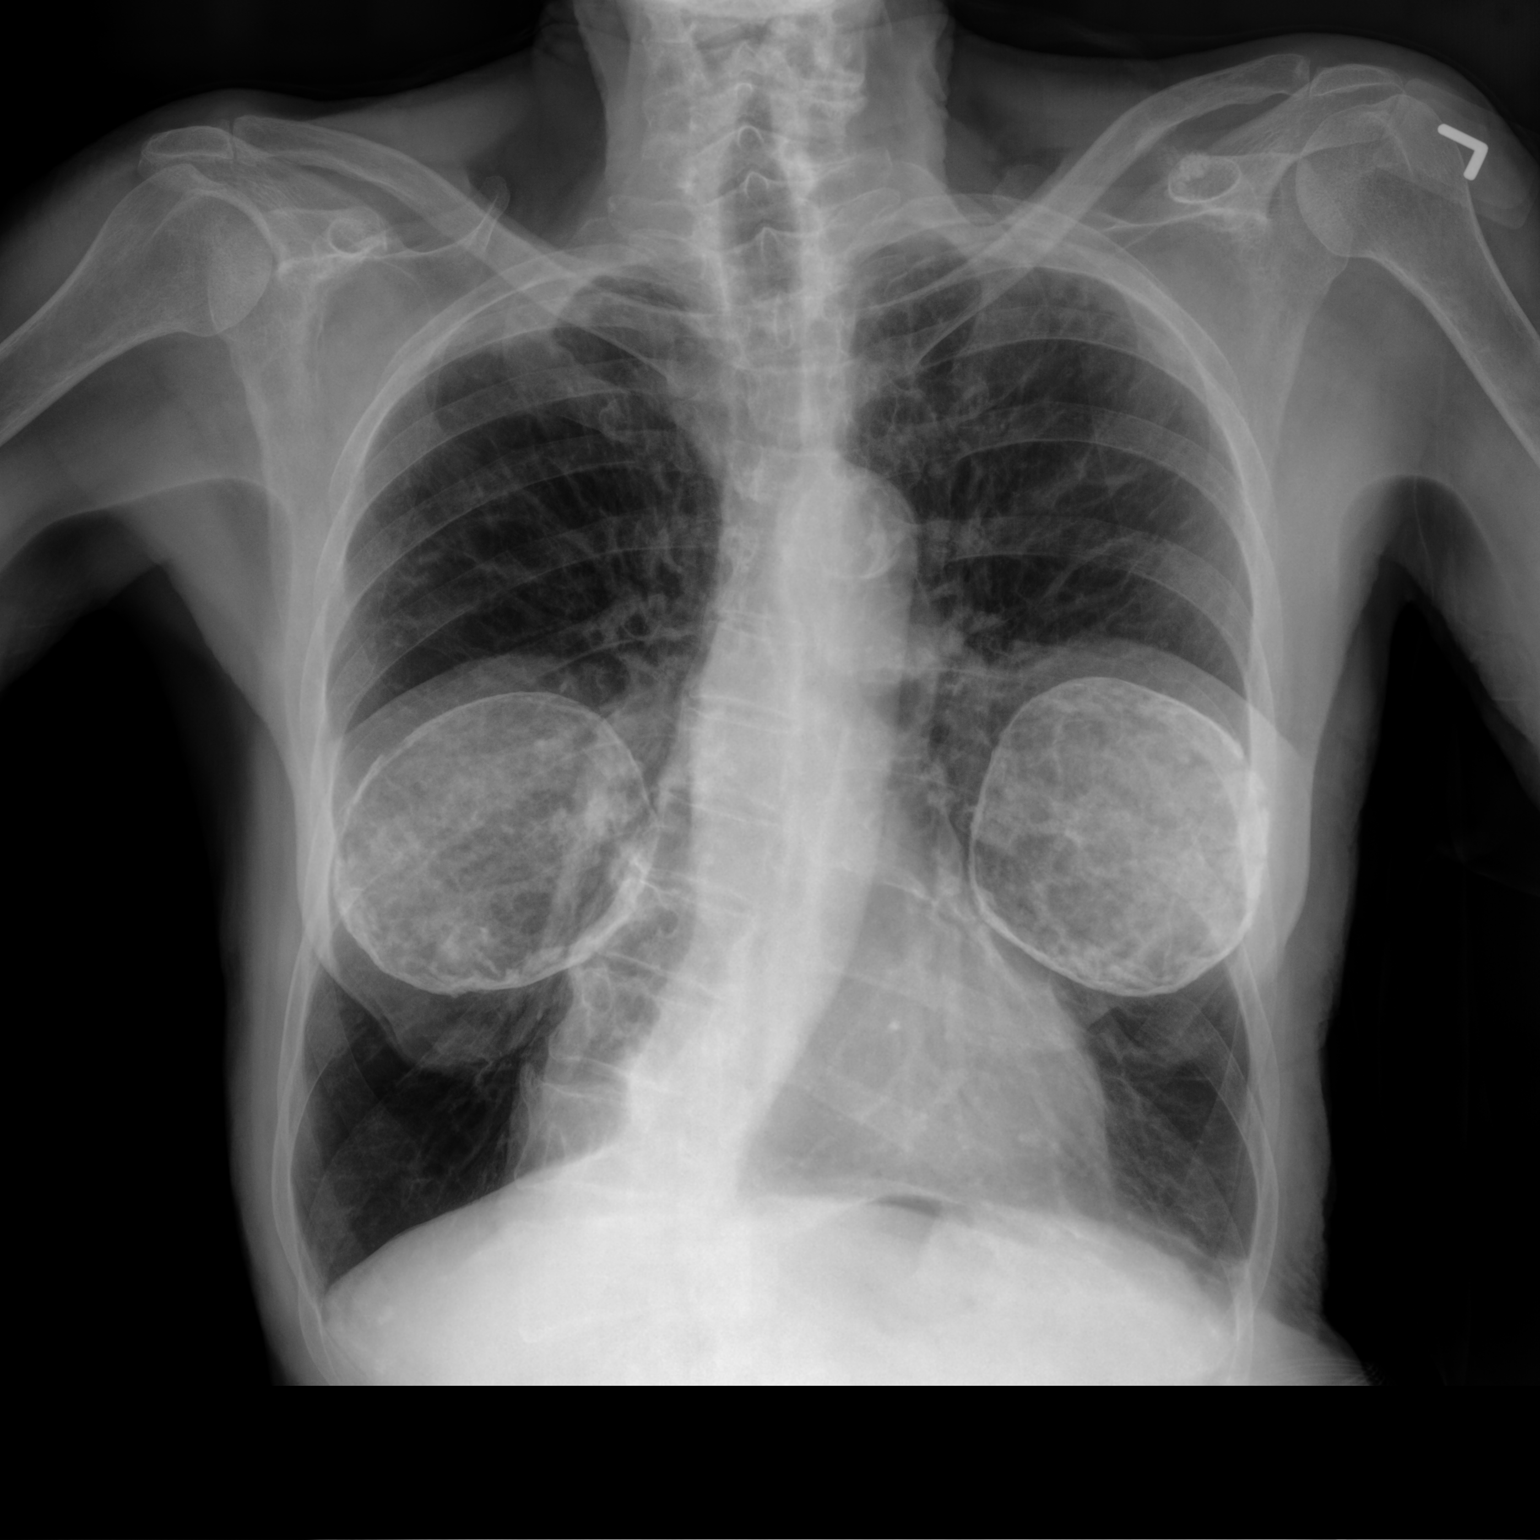

[chest lat]
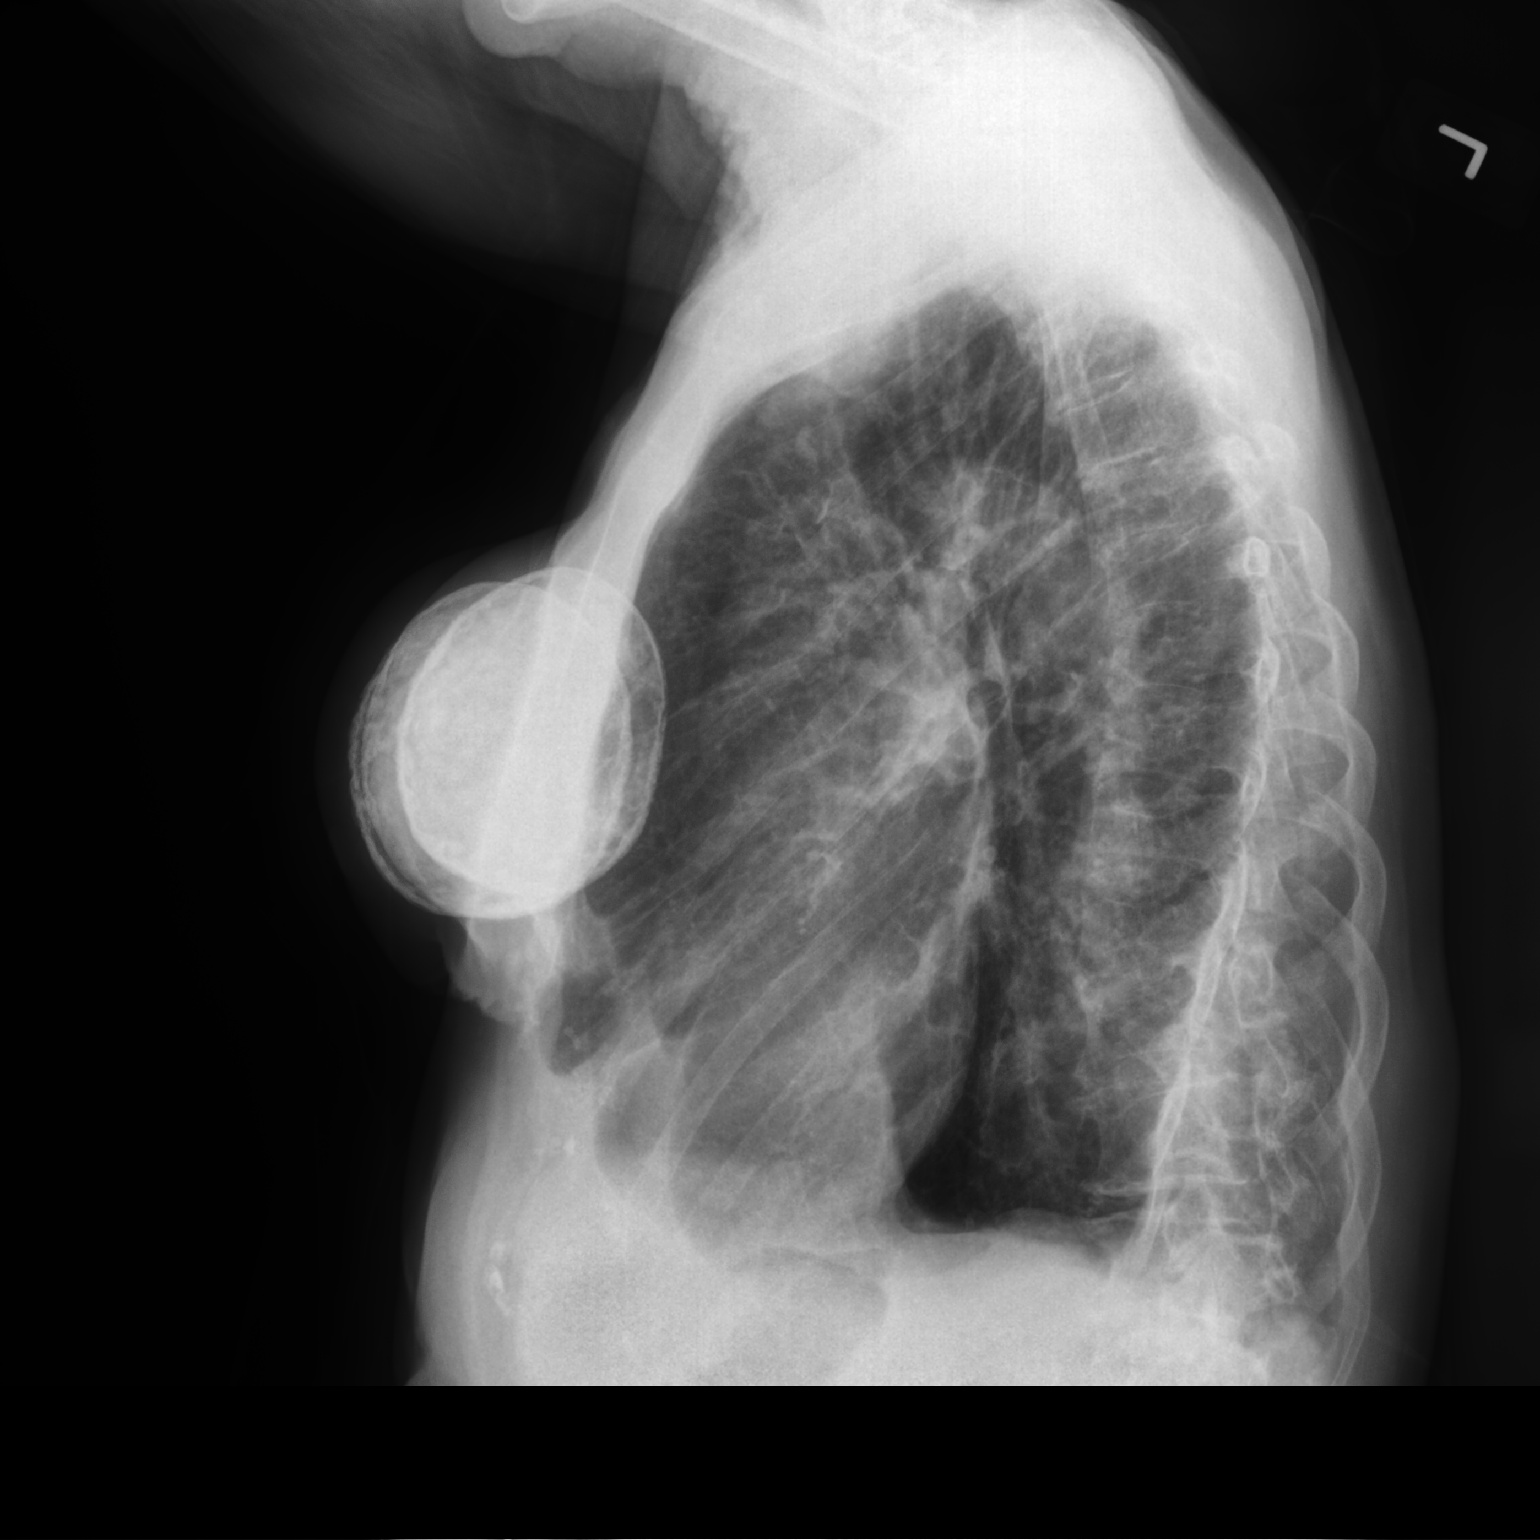

[2 of 2 positions shown; findings below may reference images not displayed]

FINDINGS: Bilateral calcified breast implants again noted, unchanged. Insert
sealed heart is normal size. Aortic calcifications. Mediastinal
contours within normal limits. No confluent airspace opacities or
effusions. No acute bony abnormality.
IMPRESSION: COPD.

No active disease.

## 2023-11-12 ENCOUNTER — Telehealth: Payer: Self-pay | Admitting: Internal Medicine

## 2023-11-12 NOTE — Telephone Encounter (Signed)
Pt callling in for Prednisone for her hard cough  [id number] - [place], [place] - [address] AT [facility] [address]

## 2023-11-13 NOTE — Telephone Encounter (Signed)
Pt calling in for a  second time to get Prednisone for her hard cough pls advise

## 2023-11-14 MED ORDER — PREDNISONE 10 MG PO TABS: ORAL_TABLET | ORAL | 0 refills | Status: DC

## 2023-11-14 NOTE — Telephone Encounter (Signed)
Spoke with patient and she reports increased productive cough with congestion, she states the mucus has no color. She reports cough is "rattling" in her chest with some wheezing. She denies fever/chills/increased shob. She has been taking Mucinex, Chlorpheniramine and Allegra. She cannot take Tessalon as she is allergic to gelatin.   Please advise, thank you!

## 2023-11-14 NOTE — Telephone Encounter (Signed)
Called and spoke with pt about pred being sent in to the pharmacy. She verbalized understanding nfn

## 2023-11-14 NOTE — Telephone Encounter (Signed)
Prednisone 10 mg take  4 each am x 2 days,   2 each am x 2 days,  1 each am x 2 days and stop

## 2023-11-14 NOTE — Telephone Encounter (Signed)
PT Calling again. Please call in ordered Pred by Dr. Richardean Sale is [facility] on [address] [facility]

## 2023-11-14 NOTE — Telephone Encounter (Signed)
Please route to Dr. Sherene Sires. This is his patient and he is here today. Thanks.

## 2023-12-16 NOTE — Progress Notes (Unsigned)
Virginia [name], female    DOB: 18-Sep-1941   MRN: [id number]   Brief patient profile:  82  yowf quit smoking 2003 with sinus problems since age 82 self  referred to pulmonary clinic 08/21/2023 for acute visit with need for inhalers x 2019 mostly with exertion (Golf) with pos allergy testing  remotely   Dr Francine Graven pt PFTS 2021 showed ratio 41%, FEV1 0.81L (40%), FVC 1.88L (69%), no significant bronchodilator response. DLCO 33%    History of Present Illness  08/21/2023  Pulmonary/ 1st office eval/[place] [name] on Trelegy had been anoro  Chief Complaint  Patient presents with   Acute Visit    Cough x 6 weeks.  Prednisone called in by APP did not help sx.  Dyspnea:  pushes cart fine hamricks Cough: dry raspy feels something stuck in throat < one tsp  Sleep: fine flat bed  SABA use: last used pm one day prior to eval  Rec Plan A = Automatic = Always=    Anoro one click each am then Either qvar or asmanex 100 2 puffs each AM Prednisone 10 mg take  4 each am x 2 days,   2 each am x 2 days,  1 each am x 2 days and stop  Plan B = Backup (to supplement plan A, not to replace it) Only use your albuterol inhaler as a rescue medication  Work on inhaler technique: Please remember to go to the  x-ray department  for your tests - we will call you with the results when they are available     09/10/2023  f/u ov/[place] office/[place]ia [name] re: GOLD 3  maint on anoro /singulair  need f/u cxr or   Chief Complaint  Patient presents with   Follow-up    6 month follow up  Dyspnea:  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 on a flat grade s sob but does fine slow and flat  Cough: improved p prednisone and has not recurred off trelegy  Sleeping: flat bed on pillow s  resp cc  SABA use: 3 x daily  02: 3lpm HS  and not titrating daytime Hoarseness better off trelegy but hasn't started qvar yet  Rec Make sure you check your oxygen saturation  AT  your highest level of activity (not after you stop)   to be sure it stays over 90%   Plan A = Automatic = Always=    Anoro one click  each am and qvar 2 puffs to follow  Work on inhaler technique:    Plan B = Backup (to supplement plan A, not to replace it) Only use your albuterol inhaler as a rescue medication Plan C = Crisis (instead of Plan B but only if Plan B stops working) - Prednisone 10 mg take  4 each am x 2 days,   2 each am x 2 days,  1 each am x 2 days and stop   cxray >>> c/w copd only   Please schedule a follow up visit in 3 months but call sooner if needed - bring inhalers     12/17/2023  f/u ov/ office/[place]ia [name] re: GOLD 3 copd maint on anoro/ qvar  did  bring qvar/saba hfa Chief Complaint  Patient presents with   Follow-up  Dyspnea:  slow and flat fine  = 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 on a flat grade s sob but does fine slow and flat   Cough: explosive dry cough sporatic  x  2 weeks assoc with dry  throat / mouth she attributes to taking singulair (she read the PI)  Sleeping: flat bed/ one pillow   s resp cc  SABA use: once a day x one puff  02: 3lpm hs only    No obvious day to day or daytime variability or assoc excess/ purulent sputum or mucus plugs or hemoptysis or cp or chest tightness, subjective wheeze or overt sinus or hb symptoms.    Also denies any obvious fluctuation of symptoms with weather or environmental changes or other aggravating or alleviating factors except as outlined above   No unusual exposure hx or h/o childhood pna/ asthma or knowledge of premature birth.  Current Allergies, Complete Past Medical History, Past Surgical History, Family History, and Social History were reviewed in [name] record.  ROS  The following are not active complaints unless bolded Hoarseness, sore throat, dysphagia, dental problems, itching, sneezing,  nasal congestion or discharge of excess mucus or purulent secretions, ear ache,   fever, chills, sweats, unintended wt loss or wt gain, classically pleuritic or exertional  cp,  orthopnea pnd or arm/hand swelling  or leg swelling, presyncope, palpitations, abdominal pain, anorexia, nausea, vomiting, diarrhea  or change in bowel habits or change in bladder habits, change in stools or change in urine, dysuria, hematuria,  rash, arthralgias, visual complaints, headache, numbness, weakness or ataxia or problems with walking or coordination,  change in mood or  memory.        Current Meds  Medication Sig   albuterol (VENTOLIN HFA) 108 (90 Base) MCG/ACT inhaler Inhale 1 puff into the lungs every 6 (six) hours as needed for wheezing or shortness of breath.   beclomethasone (QVAR REDIHALER) 80 MCG/ACT inhaler INHALE 2 PUFFS INTO THE LUNGS TWICE DAILY   chlorpheniramine (CHLOR-TRIMETON) 4 MG tablet Take 4 mg by mouth at bedtime.   dextromethorphan-guaiFENesin (MUCINEX DM) 30-600 MG 12hr tablet Take 1 tablet by mouth daily at 6 (six) AM.   fexofenadine (ALLEGRA) 180 MG tablet Take 180 mg by mouth daily.   latanoprost (XALATAN) 0.005 % ophthalmic solution 1 drop at bedtime.   magnesium oxide (MAG-OX) 400 MG tablet Take 400 mg by mouth daily.   montelukast (SINGULAIR) 10 MG tablet TAKE 1 TABLET(10 MG) BY MOUTH AT BEDTIME   predniSONE (DELTASONE) 10 MG tablet Take 4 tabs for 2 days, then 3 tabs for 2 days, 2 tabs for 2 days, then 1 tab for 2 days, then stop.   triamcinolone ointment (KENALOG) 0.1 % Apply 1 Application topically 2 (two) times daily as needed (rash flare). Do not use on the face, neck, armpits or groin area. Do not use more than 3 weeks in a row.   umeclidinium-vilanterol (ANORO ELLIPTA) 62.5-25 MCG/ACT AEPB Inhale 1 puff into the lungs daily.              Past Medical History:  Diagnosis Date   Allergic    Asthma    COPD (chronic obstructive pulmonary disease) (HCC)    Osteopenia    Palpitations       Objective:     12/17/2023     113    09/10/23 117 lb (53.1 kg)  08/21/23 112 lb 9.6 oz (51.1 kg)  06/26/23 113 lb (51.3 kg)    Vital signs  reviewed  12/17/2023  - Note at rest 02 sats  93% on RA   General appearance:    amb wf/ slt hoarse/ full dentures     HEENT :  Oropharynx  clear  /  full dentures    NECK :  without JVD/Nodes/TM/ nl carotid upstrokes bilaterally   LUNGS: no acc muscle use,  Mod barrel/scoliotic  contour chest wall with bilateral  Distant bs s audible wheeze and  without cough on insp or exp maneuvers and mod  Hyperresonant  to  percussion bilaterally     CV:  RRR  no s3 or murmur or increase in P2, and no edema   ABD:  soft and nontender with pos mid insp Hoover's  in the supine position. No bruits or organomegaly appreciated, bowel sounds nl  MS:   Ext warm without deformities or   obvious joint restrictions , calf tenderness, cyanosis or clubbing  SKIN: warm and dry without lesions    NEURO:  alert, approp, nl sensorium with  no motor or cerebellar deficits apparent.                  Assessment

## 2023-12-17 ENCOUNTER — Ambulatory Visit: Payer: Medicare Other | Admitting: Internal Medicine

## 2023-12-17 ENCOUNTER — Encounter: Payer: Self-pay | Admitting: Internal Medicine

## 2023-12-17 VITALS — BP 142/70 | HR 89 | Ht 65.0 in | Wt 113.2 lb

## 2023-12-17 DIAGNOSIS — J449 Chronic obstructive pulmonary disease, unspecified: Secondary | ICD-10-CM | POA: Diagnosis not present

## 2023-12-17 DIAGNOSIS — J9611 Chronic respiratory failure with hypoxia: Secondary | ICD-10-CM

## 2023-12-17 NOTE — Patient Instructions (Addendum)
Try off singulair for a few days and then stop chlorpheniramine a few days later if still having dry mouth/ throat    If cough not better >   Try prilosec otc 20mg   Take 30-60 min before first meal of the day and Pepcid ac (famotidine) 20 mg one @  bedtime until cough is completely gone for at least a week without the need for cough suppression   If still not happy with results please call for symbioct 80 to take one twice daily  (to replace both anoro and qvar)   Work on inhaler technique:  relax and gently blow all the way out then take a nice smooth full deep breath back in, triggering the (aerosol)  inhalers at same time you start breathing in.  Hold breath in for at least  5 seconds if you can.  Rinse and gargle with water when done.  If mouth or throat bother you at all,  try brushing teeth/gums/tongue with arm and hammer toothpaste/ make a slurry and gargle and spit out.        Please schedule a follow up visit in 3 months but call sooner if needed

## 2023-12-18 NOTE — Assessment & Plan Note (Addendum)
 Quit smoking 2003 - PFTS 2021 showed ratio 41%, FEV1 0.81L (40%), FVC 1.88L (69%), no significant bronchodilator response. DLCO 33%  - 09/10/2023  anoro/qvar if tol - 12/17/2023  After extensive coaching inhaler device,  effectiveness =    75% from a baseline of < 50% with hfa   Group D (now reclassified as E) in terms of symptom/risk and laba/lama/ICS  therefore appropriate rx at this point >>>  anoro and qvar 80 ok but now mostly bothered by upper airway symptoms of cough and dryness and I strongly doubt singulair  is the culprit so rec:  Try off singulair  for a few days and then stop chlorpheniramine a few days later if still having dry mouth/ throat    If cough not better >   Try prilosec otc 20mg   Take 30-60 min before first meal of the day and Pepcid  ac (famotidine ) 20 mg one @  bedtime until cough is completely gone for at least a week without the need for cough suppression   If still not happy with results please call for symbioct 80 to take one twice daily  (to replace both anoro and qvar) short term trial   Work on inhaler technique:    F/u in 3 m, sooner if needed  Discussed in detail all the  indications, usual  risks and alternatives  relative to the benefits with patient who agrees to proceed with Rx as outlined.

## 2023-12-18 NOTE — Assessment & Plan Note (Addendum)
 As of 12/17/2023   using 02 3lpm and prn daytime but not titrating with exertion   Again advised: Make sure you check your oxygen  saturation  AT  your highest level of activity (not after you stop)   to be sure it stays over 90% and adjust  02 flow upward to maintain this level if needed but remember to turn it back to previous settings when you stop (to conserve your supply).    Each maintenance medication was reviewed in detail including emphasizing most importantly the difference between maintenance and prns and under what circumstances the prns are to be triggered using an action plan format where appropriate.  Total time for H and P, chart review, counseling, reviewing hfa/dpi/ 02 /pulse ox device(s) and generating customized AVS unique to this office visit / same day charting = 31 min

## 2024-02-08 ENCOUNTER — Other Ambulatory Visit (HOSPITAL_COMMUNITY): Payer: Self-pay

## 2024-02-08 ENCOUNTER — Telehealth: Payer: Self-pay

## 2024-02-08 NOTE — Telephone Encounter (Signed)
Called patient.  Patient states the pharmacy was making a mistake in keying in the rx.  Patient states she has got this corrected.

## 2024-02-08 NOTE — Telephone Encounter (Signed)
Pharmacy Patient Advocate Encounter   Received notification from CoverMyMeds that prior authorization for Anoro Ellipta 62.5-25MCG/ACT aerosol powder is required/requested.   Insurance verification completed.   The patient is insured through [facility] Medicare Part D .   Per test claim: Refill too soon. PA is not needed at this time. Medication was filled 02-06-2024. Next eligible fill date is 02-29-2024.

## 2024-02-08 NOTE — Telephone Encounter (Signed)
Clearly someone is confused if the med was just refilled, no change in recs

## 2024-02-08 NOTE — Telephone Encounter (Signed)
Dr. Sherene Sires, Please see note per pharmacy team regarding Anoro.  Thank you.

## 2024-02-25 ENCOUNTER — Encounter: Payer: Self-pay | Admitting: Internal Medicine

## 2024-02-25 ENCOUNTER — Ambulatory Visit: Admitting: Internal Medicine

## 2024-02-25 VITALS — BP 175/83 | HR 121 | Ht 65.0 in | Wt 109.6 lb

## 2024-02-25 DIAGNOSIS — Z87891 Personal history of nicotine dependence: Secondary | ICD-10-CM | POA: Diagnosis not present

## 2024-02-25 DIAGNOSIS — J449 Chronic obstructive pulmonary disease, unspecified: Secondary | ICD-10-CM | POA: Diagnosis not present

## 2024-02-25 DIAGNOSIS — J9611 Chronic respiratory failure with hypoxia: Secondary | ICD-10-CM | POA: Diagnosis not present

## 2024-02-25 NOTE — Assessment & Plan Note (Signed)
 As of 02/25/2024  using 02 3lpm at hs  and prn daytime but not titrating with exertion   Advised: Make sure you check your oxygen saturation  AT  your highest level of activity (not after you stop)   to be sure it stays over 90% and adjust  02 flow upward to maintain this level if needed but remember to turn it back to previous settings when you stop (to conserve your supply).   F/u q 6 m sooner prn         Each maintenance medication was reviewed in detail including emphasizing most importantly the difference between maintenance and prns and under what circumstances the prns are to be triggered using an action plan format where appropriate.  Total time for H and P, chart review, counseling, reviewing dpi device(s) and generating customized AVS unique to this office visit / same day charting = 21 min

## 2024-02-25 NOTE — Progress Notes (Signed)
 Virginia [name], female    DOB: 1941/10/21   MRN: [id number]   Brief patient profile:  1  yowf quit smoking 2003 with sinus problems since age 83 self  referred to pulmonary clinic 08/21/2023 for acute visit with need for inhalers x 2019 mostly with exertion (Golf) with pos allergy testing  remotely   Dr Francine [name] pt PFTS 2021 showed ratio 41%, FEV1 0.81L (40%), FVC 1.88L (69%), no significant bronchodilator response. DLCO 33%    History of Present Illness  08/21/2023  Pulmonary/ 1st office eval/[place] on Trelegy had been anoro  Chief Complaint  Patient presents with   Acute Visit    Cough x 6 weeks.  Prednisone called in by APP did not help sx.  Dyspnea:  pushes cart fine hamricks Cough: dry raspy feels something stuck in throat < one tsp  Sleep: fine flat bed  SABA use: last used pm one day prior to eval  Rec Plan A = Automatic = Always=    Anoro one click each am then Either qvar or asmanex 100 2 puffs each AM Prednisone 10 mg take  4 each am x 2 days,   2 each am x 2 days,  1 each am x 2 days and stop  Plan B = Backup (to supplement plan A, not to replace it) Only use your albuterol inhaler as a rescue medication  Work on inhaler technique: Please remember to go to the  x-ray department  for your tests - we will call you with the results when they are available     09/10/2023  f/u ov/[place] office/[place]ia [name] re: GOLD 3  maint on anoro /singulair  need f/u cxr or   Chief Complaint  Patient presents with   Follow-up    6 month follow up  Dyspnea:  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 on a flat grade s sob but does fine slow and flat  Cough: improved p prednisone and has not recurred off trelegy  Sleeping: flat bed on pillow s  resp cc  SABA use: 3 x daily  02: 3lpm HS  and not titrating daytime Hoarseness better off trelegy but hasn't started qvar yet  Rec Make sure you check your oxygen saturation  AT  your highest level of activity (not after you stop)   to be sure it stays over 90%   Plan A = Automatic = Always=    Anoro one click  each am and qvar 2 puffs to follow  Work on inhaler technique:    Plan B = Backup (to supplement plan A, not to replace it) Only use your albuterol inhaler as a rescue medication Plan C = Crisis (instead of Plan B but only if Plan B stops working) - Prednisone 10 mg take  4 each am x 2 days,   2 each am x 2 days,  1 each am x 2 days and stop   cxray >>> c/w copd only   Please schedule a follow up visit in 3 months but call sooner if needed - bring inhalers     12/17/2023  f/u ov/[place] office/[place]ia [name] re: GOLD 3 copd maint on anoro/ qvar  did  bring qvar/saba hfa Chief Complaint  Patient presents with   Follow-up  Dyspnea:  slow and flat fine  = 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 on a flat grade s sob but does fine slow and flat   Cough: explosive dry cough sporatic  x  2 weeks assoc with dry  throat / mouth she attributes to taking singulair (she read the PI)  Sleeping: flat bed/ one pillow   s resp cc  SABA use: once a day x one puff  02: 3lpm hs only  Rec Try off singulair for a few days and then stop chlorpheniramine a few days later if still having dry mouth/ throat  If cough not better >   Try prilosec otc 20mg   Take 30-60 min before first meal of the day and Pepcid ac (famotidine) 20 mg one @  bedtime until cough is completely gone for at least a week without the need for cough suppression If still not happy with results please call for symbioct 80 to take one twice daily  (to replace both anoro and qvar)  Work on inhaler technique:       02/25/2024  f/u ov/[place] office/[place]ia [name] re: GOLD 3 copd  maint on anoro   Chief Complaint  Patient presents with   Follow-up    3 month follow up COPD   Dyspnea:  ok shopping [place]  ok leaning on cart / [employer] with 02 in cart but not using it or checking sats/ cane helps back pain which helps breathing  Cough: better  Sleeping: flat bed one pillow s    resp cc  SABA use: once a day  at most 02: 3lpm hs /    No obvious day to day or daytime variability or assoc excess/ purulent sputum or mucus plugs or hemoptysis or cp or chest tightness, subjective wheeze or overt sinus or hb symptoms.    Also denies any obvious fluctuation of symptoms with weather or environmental changes or other aggravating or alleviating factors except as outlined above   No unusual exposure hx or h/o childhood pna/ asthma or knowledge of premature birth.  Current Allergies, Complete Past Medical History, Past Surgical History, Family History, and Social History were reviewed in [name] record.  ROS  The following are not active complaints unless bolded Hoarseness, sore throat, dysphagia, dental problems, itching, sneezing,  nasal congestion or discharge of excess mucus or purulent secretions, ear ache,   fever, chills, sweats, unintended wt loss or wt gain, classically pleuritic or exertional cp,  orthopnea pnd or arm/hand swelling  or leg swelling, presyncope, palpitations, abdominal pain, anorexia, nausea, vomiting, diarrhea  or change in bowel habits or change in bladder habits, change in stools or change in urine, dysuria, hematuria,  rash, arthralgias, visual complaints, headache, numbness, weakness or ataxia or problems with walking or coordination,  change in mood or  memory.        Current Meds  Medication Sig   albuterol (VENTOLIN HFA) 108 (90 Base) MCG/ACT inhaler Inhale 1 puff into the lungs every 6 (six) hours as needed for wheezing or shortness of breath.   beclomethasone (QVAR REDIHALER) 80 MCG/ACT inhaler INHALE 2 PUFFS INTO THE LUNGS TWICE DAILY   chlorpheniramine (CHLOR-TRIMETON) 4 MG tablet Take 4 mg by mouth at bedtime.   dextromethorphan-guaiFENesin (MUCINEX DM) 30-600 MG 12hr tablet Take 1 tablet by mouth daily at 6 (six) AM.   fexofenadine (ALLEGRA) 180 MG tablet Take 180 mg by mouth daily.   latanoprost (XALATAN) 0.005 % ophthalmic solution 1 drop at  bedtime.   magnesium oxide (MAG-OX) 400 MG tablet Take 400 mg by mouth daily.   montelukast (SINGULAIR) 10 MG tablet TAKE 1 TABLET(10 MG) BY MOUTH AT BEDTIME   predniSONE (DELTASONE) 10 MG tablet Take  4 each am x 2  days,   2 each am x 2 days,  1 each am x 2 days and stop   predniSONE (DELTASONE) 10 MG tablet Take 4 tabs for 2 days, then 3 tabs for 2 days, 2 tabs for 2 days, then 1 tab for 2 days, then stop.   triamcinolone ointment (KENALOG) 0.1 % Apply 1 Application topically 2 (two) times daily as needed (rash flare). Do not use on the face, neck, armpits or groin area. Do not use more than 3 weeks in a row.   umeclidinium-vilanterol (ANORO ELLIPTA) 62.5-25 MCG/ACT AEPB Inhale 1 puff into the lungs daily.                Past Medical History:  Diagnosis Date   Allergic    Asthma    COPD (chronic obstructive pulmonary disease) (HCC)    Osteopenia    Palpitations       Objective:    Wts  02/25/2024       109  12/17/2023     113    09/10/23 117 lb (53.1 kg)  08/21/23 112 lb 9.6 oz (51.1 kg)  06/26/23 113 lb (51.3 kg)    Vital signs reviewed  02/25/2024  - Note at rest 02 sats  87% on RA after walking across office to exam room    General appearance:    amb pleasant thin but healthy appearing wf with robust personality   HEENT :  Oropharynx  clear/ full dentures  Nasal turbinates nl    NECK :  without JVD/Nodes/TM/ nl carotid upstrokes bilaterally   LUNGS: no acc muscle use,  Mod barrel/scoliotic   contour chest wall with bilateral  Distant bs s audible wheeze and  without cough on insp or exp maneuvers and mod  Hyperresonant  to  percussion bilaterally     CV:  RRR  no s3 or murmur or increase in P2, and no edema   ABD:  soft and nontender with pos mid insp Hoover's  in the supine position. No bruits or organomegaly appreciated, bowel sounds nl  MS:   Ext warm without deformities or   obvious joint restrictions , calf tenderness, cyanosis or clubbing  SKIN: warm  and dry without lesions    NEURO:  alert, approp, nl sensorium with  no motor or cerebellar deficits apparent.         Assessment

## 2024-02-25 NOTE — Assessment & Plan Note (Signed)
 Quit smoking 2003 - PFTS 2021 showed ratio 41%, FEV1 0.81L (40%), FVC 1.88L (69%), no significant bronchodilator response. DLCO 33%  - 09/10/2023  anoro/qvar if tol - 12/17/2023  After extensive coaching inhaler device,  effectiveness =    75% from a baseline of < 50% with hfa   Group D (now reclassified as E) in terms of symptom/risk and laba/lama/ICS  therefore appropriate rx at this point >>>  Anoro/ qvar 80 2 each am and approp saba

## 2024-02-25 NOTE — Patient Instructions (Addendum)
Make sure you check your oxygen saturation  AT  your highest level of activity (not after you stop)   to be sure it stays over 90% and adjust  02 flow upward to maintain this level if needed but remember to turn it back to previous settings when you stop (to conserve your supply).   No change in medications   Please schedule a follow up visit in 6  months but call sooner if needed  

## 2024-02-29 ENCOUNTER — Ambulatory Visit: Payer: Medicare Other | Admitting: Internal Medicine

## 2024-03-05 ENCOUNTER — Encounter: Payer: Self-pay | Admitting: Internal Medicine

## 2024-03-05 ENCOUNTER — Ambulatory Visit: Admitting: Internal Medicine

## 2024-03-05 VITALS — BP 126/74 | HR 87 | Temp 97.7°F | Ht 65.0 in | Wt 110.6 lb

## 2024-03-05 DIAGNOSIS — J449 Chronic obstructive pulmonary disease, unspecified: Secondary | ICD-10-CM | POA: Diagnosis not present

## 2024-03-05 DIAGNOSIS — J310 Chronic rhinitis: Secondary | ICD-10-CM

## 2024-03-05 DIAGNOSIS — J9611 Chronic respiratory failure with hypoxia: Secondary | ICD-10-CM

## 2024-03-05 DIAGNOSIS — Z87891 Personal history of nicotine dependence: Secondary | ICD-10-CM

## 2024-03-05 MED ORDER — VALACYCLOVIR HCL 500 MG PO TABS: ORAL_TABLET | ORAL | 0 refills | Status: AC

## 2024-03-05 MED ORDER — PREDNISONE 10 MG PO TABS: ORAL_TABLET | ORAL | 0 refills | Status: DC

## 2024-03-05 NOTE — Progress Notes (Unsigned)
 [place] [name], female    DOB: 1941/04/15   MRN: [id number]   Brief patient profile:  83  yowf quit smoking 2003 with sinus problems since age 83 self  referred to pulmonary clinic 08/21/2023 for acute visit with need for inhalers x 2019 mostly with exertion ([employer]olf) with pos allergy testing  remotely   Dr Francine Graven pt PFTS 2021 showed ratio 41%, FEV1 0.81L (40%), FVC 1.88L (69%), no significant bronchodilator response. DLCO 33%    History of Present Illness  08/21/2023  Pulmonary/ 1st [place] [name] eval/[place] [name] on Trelegy had been anoro  Chief Complaint  Patient presents with   Acute Visit    Cough x 6 weeks.  Prednisone called in by APP did not help sx.  Dyspnea:  pushes cart fine [place] Cough: dry raspy feels something stuck in throat < one tsp  Sleep: fine flat bed  SABA use: last used pm one day prior to eval  Rec Plan A = Automatic = Always=    Anoro one click each am then Either qvar or asmanex 100 2 puffs each AM Prednisone 10 mg take  4 each am x 2 days,   2 each am x 2 days,  1 each am x 2 days and stop  Plan B = Backup (to supplement plan A, not to replace it) Only use your albuterol inhaler as a rescue medication  Work on inhaler technique: Please remember to go to the  x-ray department  for your tests - we will call you with the results when they are available     09/10/2023  f/u ov/[place] [name]/[place] [name] re: GOLD 3  maint on anoro /singulair  need f/u cxr or   Chief Complaint  Patient presents with   Follow-up    6 month follow up  Dyspnea:  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 on a flat grade s sob but does fine slow and flat  Cough: improved p prednisone and has not recurred off trelegy  Sleeping: flat bed on pillow s  resp cc  SABA use: 3 x daily  02: 3lpm HS  and not titrating daytime Hoarseness better off trelegy but hasn't started qvar yet  Rec Make sure you check your oxygen saturation  AT  your highest level of activity (not after you stop)   to be sure it stays over 90%   Plan A = Automatic = Always=    Anoro one click  each am and qvar 2 puffs to follow  Work on inhaler technique:    Plan B = Backup (to supplement plan A, not to replace it) Only use your albuterol inhaler as a rescue medication Plan C = Crisis (instead of Plan B but only if Plan B stops working) - Prednisone 10 mg take  4 each am x 2 days,   2 each am x 2 days,  1 each am x 2 days and stop   cxray >>> c/w copd only   Please schedule a follow up visit in 3 months but call sooner if needed - bring inhalers     12/17/2023  f/u ov/[place] [name]/[place] [name] re: GOLD 3 copd maint on anoro/ qvar  did  bring qvar/saba hfa Chief Complaint  Patient presents with   Follow-up  Dyspnea:  slow and flat fine  = 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 on a flat grade s sob but does fine slow and flat   Cough: explosive dry cough sporatic  x  2 weeks assoc with dry  throat / mouth she attributes to taking singulair (she read the PI)  Sleeping: flat bed/ one pillow   s resp cc  SABA use: once a day x one puff  02: 3lpm hs only  Rec Try off singulair for a few days and then stop chlorpheniramine a few days later if still having dry mouth/ throat  If cough not better >   Try prilosec otc 20mg   Take 30-60 min before first meal of the day and Pepcid ac (famotidine) 20 mg one @  bedtime until cough is completely gone for at least a week without the need for cough suppression If still not happy with results please call for symbioct 80 to take one twice daily  (to replace both anoro and qvar)  Work on inhaler technique:       02/25/2024  f/u ov/[place] [name]/[place] [name] re: GOLD 3 copd  maint on anoro   Chief Complaint  Patient presents with   Follow-up    3 month follow up COPD   Dyspnea:  ok shopping [place]  ok leaning on cart / farmers [employer] with 02 in cart but not using it or checking sats/ cane helps back pain which helps breathing  Cough: better  Sleeping: flat bed one pillow s  resp cc  SABA use: once a day at  most 02: 3lpm hs Rec Make sure you check your oxygen saturation  AT  your highest level of activity (not after you stop)   to be sure it stays over 90%    03/05/2024  ACUTE   ov/[place] [name] re: GOLD 3  copd    maint on anoro / qvar   Chief Complaint  Patient presents with   Acute Visit    Cough, nasal congestion, right ear pain x 2 days   Dyspnea:  feels more sob when nose clogged up /  can't use sudaphed products  Cough: white  Sleeping: flat bed one pillow due to nasal congestion  SABA use: rarely 02: 3lpm hs / dry    No obvious day to day or daytime variability or assoc excess/ purulent sputum or mucus plugs or hemoptysis or cp or chest tightness, subjective wheeze or overt sinus or hb symptoms.    Also denies any obvious fluctuation of symptoms with weather or environmental changes or other aggravating or alleviating factors except as outlined above   No unusual exposure hx or h/o childhood pna/ asthma or knowledge of premature birth.  Current Allergies, Complete Past Medical History, Past Surgical History, Family History, and Social History were reviewed in [name] record.  ROS  The following are not active complaints unless bolded Hoarseness, sore throat, dysphagia, dental problems, itching, sneezing,  nasal congestion or discharge of excess mucus or purulent secretions, ear ache,   fever, chills, sweats, unintended wt loss or wt gain, classically pleuritic or exertional cp,  orthopnea pnd or arm/hand swelling  or leg swelling, presyncope, palpitations, abdominal pain, anorexia, nausea, vomiting, diarrhea  or change in bowel habits or change in bladder habits, change in stools or change in urine, dysuria, hematuria,  rash, arthralgias, visual complaints, headache, numbness, weakness or ataxia or problems with walking or coordination,  change in mood or  memory.        Current Meds  Medication Sig   albuterol (VENTOLIN HFA) 108 (90 Base) MCG/ACT inhaler Inhale  1 puff into the lungs every 6 (six) hours as needed for wheezing or shortness of breath.   beclomethasone (QVAR REDIHALER) 80  MCG/ACT inhaler INHALE 2 PUFFS INTO THE LUNGS TWICE DAILY   chlorpheniramine (CHLOR-TRIMETON) 4 MG tablet Take 4 mg by mouth at bedtime.   dextromethorphan-guaiFENesin (MUCINEX DM) 30-600 MG 12hr tablet Take 1 tablet by mouth daily at 6 (six) AM.   fexofenadine (ALLEGRA) 180 MG tablet Take 180 mg by mouth daily.   latanoprost (XALATAN) 0.005 % ophthalmic solution 1 drop at bedtime.   magnesium oxide (MAG-OX) 400 MG tablet Take 400 mg by mouth daily.   montelukast (SINGULAIR) 10 MG tablet TAKE 1 TABLET(10 MG) BY MOUTH AT BEDTIME   triamcinolone ointment (KENALOG) 0.1 % Apply 1 Application topically 2 (two) times daily as needed (rash flare). Do not use on the face, neck, armpits or groin area. Do not use more than 3 weeks in a row.   umeclidinium-vilanterol (ANORO ELLIPTA) 62.5-25 MCG/ACT AEPB Inhale 1 puff into the lungs daily.             Past Medical History:  Diagnosis Date   Allergic    Asthma    COPD (chronic obstructive pulmonary disease) (HCC)    Osteopenia    Palpitations       Objective:    Wts 03/05/2024        110  02/25/2024       109  12/17/2023     113    09/10/23 117 lb (53.1 kg)  08/21/23 112 lb 9.6 oz (51.1 kg)  06/26/23 113 lb (51.3 kg)     Vital signs reviewed  03/05/2024  - Note at rest 02 sats  93% on Ra   General appearance:    amb wf nad   HEENT : Oropharynx  clear   Nasal turbinates mod edema/ watery discharge/ nl ear canals and tm's   NECK :  without  apparent JVD/ palpable Nodes/TM    LUNGS: no acc muscle use,  Mild barrel/scoliotic  contour chest wall with bilateral  Distant bs  with wheeze on fvc resolves with plm  and  without cough on insp or exp maneuvers  and mild  Hyperresonant  to  percussion bilaterally     CV:  RRR  no s3 or murmur or increase in P2, and no edema   ABD:  soft and nontender with pos end  insp  Hoover's  in the supine position.  No bruits or organomegaly appreciated   MS:  Nl gait/ ext warm without deformities Or obvious joint restrictions  calf tenderness, cyanosis or clubbing     SKIN: warm and dry without lesions    NEURO:  alert, approp, nl sensorium with  no motor or cerebellar deficits apparent.            Assessment

## 2024-03-05 NOTE — Patient Instructions (Addendum)
 I emphasized that nasal steroids (flonase)have no immediate benefit in terms of improving symptoms.  To help them reached the target tissue, the patient should use Afrin two puffs every 12 hours applied one min before using the nasal steroids.  Afrin should be stopped after no more than 5 days.  If the symptoms worsen, Afrin can be restarted after 5 days off of therapy to prevent rebound congestion from overuse of Afrin.   Prednisone 10 mg take  4 each am x 2 days,   2 each am x 2 days,  1 each am x 2 days and stop   You may need humidified oxygen ( if not better on room humidifier with dry nasal passage)    Call if not better in a few days call for referral to [facility]

## 2024-03-06 NOTE — Assessment & Plan Note (Signed)
 Quit smoking 2003 - PFTS 2021 showed ratio 41%, FEV1 0.81L (40%), FVC 1.88L (69%), no significant bronchodilator response. DLCO 33%  - 09/10/2023  anoro/qvar if tol - 12/17/2023  After extensive coaching inhaler device,  effectiveness =    75% from a baseline of < 50% with hfa  No evidence of aecopd > no change rx anoro/ qvar

## 2024-03-06 NOTE — Assessment & Plan Note (Signed)
 Non specific symptoms/ findings rec rx;  Afrin x 5 d followed by flonase Avoid decongestants as says intolerant Humidify bedroom  Consider humidify 02   Prednisone 10 mg take  4 each am x 2 days,   2 each am x 2 days,  1 each am x 2 days and stop   Ent f/u prn   Keep prior appts          Each maintenance medication was reviewed in detail including emphasizing most importantly the difference between maintenance and prns and under what circumstances the prns are to be triggered using an action plan format where appropriate.  Total time for H and P, chart review, counseling, reviewing dpi/hfa/nasal  device(s) and generating customized AVS unique to this office visit / same day charting = 

## 2024-03-06 NOTE — Assessment & Plan Note (Signed)
 As of 03/05/2024  using 02 3lpm at hs  and prn daytime     Reminded: Make sure you check your oxygen saturation  AT  your highest level of activity (not after you stop)   to be sure it stays over 90% and adjust  02 flow upward to maintain this level if needed but remember to turn it back to previous settings when you stop (to conserve your supply).

## 2024-05-27 ENCOUNTER — Telehealth: Payer: Self-pay

## 2024-05-27 NOTE — Telephone Encounter (Signed)
 The medication takes a full week to wear off and nothing needed in meantime as long as has rescue rx. If get worse can just use pred x 6 days   Prednisone  10 mg take  4 each am x 2 days,   2 each am x 2 days,  1 each am x 2 days and stop

## 2024-05-27 NOTE — Telephone Encounter (Signed)
 Copied from CRM [id number]. Topic: Clinical - Prescription Issue >> May 27, 2024  2:14 PM Juliana Ocean wrote: Reason for CRM: pt states her beclomethasone (QVAR REDIHALER) 80 MCG/ACT inhaler broke.  The company is going to send her a replacement, however, that leaves her about 3 days w/out an inhaler.   She is asking if you have a sample please. If not is there an alternative? Pt states she needs something please? The replacement will have to go to pharamacy, and she will pick up there. >> May 27, 2024  2:35 PM Juliana Ocean wrote: Pt has been seeing Wert and [facility].   So she asked I send to both providers and see who may answer first.  Spoke with patient regarding prior message . Patient stated her Qvar inhaler broke and patient will be leaving uin the morning for the day . Advised patient our office doe not have samples of Qvar inhaler . Advised patient I'm going to send a message to Dr.Wert to see what other options Dr.Wert has to offer patient . Patient does have a ventolin  inhaler as well.  Dr.Wert can you please advise   Thank you

## 2024-05-28 ENCOUNTER — Encounter: Payer: Self-pay | Admitting: Internal Medicine

## 2024-05-28 MED ORDER — PREDNISONE 10 MG PO TABS: ORAL_TABLET | ORAL | 0 refills | Status: DC

## 2024-05-28 NOTE — Telephone Encounter (Signed)
 Called and spoke with the pt and notified of response per Dr. Waymond Hailey  She verbalized understanding  I have sent rx for pred to her preferred pharm  Nothing further needed

## 2024-07-03 ENCOUNTER — Other Ambulatory Visit: Payer: Self-pay | Admitting: Internal Medicine

## 2024-07-03 ENCOUNTER — Ambulatory Visit: Payer: Self-pay

## 2024-07-03 MED ORDER — PREDNISONE 10 MG PO TABS
ORAL_TABLET | ORAL | 0 refills | Status: DC
Start: 2024-07-03 — End: 2024-08-25

## 2024-07-03 MED ORDER — PREDNISONE 10 MG PO TABS: ORAL_TABLET | ORAL | 0 refills | Status: DC

## 2024-07-03 NOTE — Telephone Encounter (Signed)
 Be sure taking mucine dm 1200 mg every 12 hours (not the mucinex) Prednisone  10 mg take  4 each am x 2 days,   2 each am x 2 days,  1 each am x 2 days and stop  Be sure taking gerd rx whether she has hb or not: Try prilosec otc 20mg   Take 30-60 min before first meal of the day and Pepcid  ac (famotidine ) 20 mg one @  bedtime until cough is completely gone for at least a week without the need for cough suppression      I called and spoke with the pt and notified of response from Dr Darlean. She verbalized understanding. She states she is already taking the mucinex dm 1200 bid. Rx for pred was sent and she will call if not improving with the above recommendations. Nothing further needed.

## 2024-07-03 NOTE — Telephone Encounter (Signed)
 FYI Only or Action Required?: Action required by provider: update on patient condition and medication request.  Patient is followed in Pulmonology for COPD and asthma, last seen on 03/05/2024 by Darlean Ozell NOVAK, MD.  Called Nurse Triage reporting Cough.  Symptoms began a week ago.  Interventions attempted: OTC medications: Mucinex, Rescue inhaler, and Maintenance inhaler.  Symptoms are: unchanged.  Triage Disposition: See HCP Within 4 Hours (Or PCP Triage)  Patient/caregiver understands and will follow disposition?: No, refuses disposition                             Copied from CRM [id number]. Topic: Clinical - Red Word Triage >> Jul 03, 2024 11:55 AM Russell PARAS wrote: Red Word that prompted transfer to Nurse Triage:   Croupy cough over the summer, but has gotten worse in the past week No chest tightness or wheezing Coughing up phlegm, slightly colored.  Has shortness of breath, typically baseline but has increased recently Using oxygen  during the day, which is not typical for her. Reason for Disposition  [1] MILD difficulty breathing (e.g., minimal/no SOB at rest, SOB with walking, pulse < 100) AND [2] still present when not coughing  Answer Assessment - Initial Assessment Questions E2C2 Pulmonary Triage - Initial Assessment Questions  1. Chief Complaint (e.g., cough, sob, wheezing, fever, chills, sweat or additional symptoms) *Go to specific symptom protocol after initial questions. Croupy cough   2. Have you tested for COVID or Flu? Note: If not, ask patient if a home test can be taken. If so, instruct patient to call back for positive results. Denies  3. Have you used any OTC meds to help with symptoms? If yes, ask What medications? Mucinex nightly   4. Have you used your inhalers/maintenance medication? If yes, What medications? Anoro, Qvar and albuterol   5. Do you wear supplemental oxygen ? If yes, How many liters are you supposed  to use? States she uses O2 at night, states she is having to use O2 during the day when she exerts herself   6. Do you monitor your oxygen  levels? If yes, What is your reading (oxygen  level) today? Unsure, states she cannot find her pulse oximeter    1. ONSET: When did the cough begin?      About a week  2. SEVERITY: How bad is the cough today?      1-2 bad spells per day 3. SPUTUM: Describe the color of your sputum (e.g., none, dry cough; clear, white, yellow, green)     Occasionally, lightly colored 5. DIFFICULTY BREATHING: Are you having difficulty breathing? If Yes, ask: How bad is it? (e.g., mild, moderate, severe)      SOB during exertion, denies SOB during coughing spells, patient able to speak in clear and complete sentences while on phone with this RN  6. FEVER: Do you have a fever? If Yes, ask: What is your temperature, how was it measured, and when did it start?     Denies 8. LUNG HISTORY: Do you have any history of lung disease?  (e.g., pulmonary embolus, asthma, emphysema)     Asthma and COPD 10. OTHER SYMPTOMS: Do you have any other symptoms? (e.g., runny nose, wheezing, chest pain)     Denies chest pain, maybe wheezing in the morning    Advised patient to be seen in office today. No availability at [facility] location, offered available appointment at [facility] location. Patient declined and stated her provider typically prescribes her  medication without an appointment. Please advise.  Protocols used: Cough - Acute Productive-A-AH

## 2024-08-14 ENCOUNTER — Ambulatory Visit: Admitting: Internal Medicine

## 2024-08-24 NOTE — Progress Notes (Unsigned)
 Virginia [name], female    DOB: 12/02/1941   MRN: [id number]   Brief patient profile:  83  yowf quit smoking 2003 with sinus problems since age 83 self- referred to pulmonary clinic 08/21/2023 for acute visit with need for inhalers x 2019 mostly with exertion (Golf) with pos allergy testing  remotely   Dr Kara pt PFTS 2021 showed ratio 41%, FEV1 0.81L (40%), FVC 1.88L (69%), no significant bronchodilator response. DLCO 33%    History of Present Illness  08/21/2023  Pulmonary/ 1st office eval/Virginia [name] on Trelegy had been anoro  Chief Complaint  Patient presents with   Acute Visit    Cough x 6 weeks.  Prednisone  called in by APP did not help sx.  Dyspnea:  pushes cart fine hamricks Cough: dry raspy feels something stuck in throat < one tsp  Sleep: fine flat bed  SABA use: last used pm one day prior to eval  Rec Plan A = Automatic = Always=    Anoro one click each am then Either qvar or asmanex  100 2 puffs each AM Prednisone  10 mg take  4 each am x 2 days,   2 each am x 2 days,  1 each am x 2 days and stop  Plan B = Backup (to supplement plan A, not to replace it) Only use your albuterol  inhaler as a rescue medication  Work on inhaler technique: Please remember to go to the  x-ray department  for your tests - we will call you with the results when they are available     09/10/2023  f/u ov/[place] office/Virginia [name] re: GOLD 3  maint on anoro /singulair   need f/u cxr or   Chief Complaint  Patient presents with   Follow-up    6 month follow up  Dyspnea:  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 on a flat grade s sob but does fine slow and flat  Cough: improved p prednisone  and has not recurred off trelegy  Sleeping: flat bed on pillow s  resp cc  SABA use: 3 x daily  02: 3lpm HS  and not titrating daytime Hoarseness better off trelegy but hasn't started qvar yet  Rec Make sure you check your oxygen  saturation  AT  your highest level of activity (not after you stop)   to be sure it stays over 90%   Plan A = Automatic = Always=    Anoro one click  each am and qvar 2 puffs to follow  Work on inhaler technique:    Plan B = Backup (to supplement plan A, not to replace it) Only use your albuterol  inhaler as a rescue medication Plan C = Crisis (instead of Plan B but only if Plan B stops working) - Prednisone  10 mg take  4 each am x 2 days,   2 each am x 2 days,  1 each am x 2 days and stop   cxray >>> c/w copd only   Please schedule a follow up visit in 3 months but call sooner if needed - bring inhalers         03/05/2024  ACUTE   ov/Virginia [name] re: GOLD 3  copd    maint on anoro / qvar   Chief Complaint  Patient presents with   Acute Visit    Cough, nasal congestion, right ear pain x 2 days   Dyspnea:  feels more sob when nose clogged up /  can't use sudaphed products  Cough: white  Sleeping: flat bed one  pillow due to nasal congestion  SABA use: rarely 02: 3lpm hs / dry  Rec Prednisone  10 mg take  4 each am x 2 days,   2 each am x 2 days,  1 each am x 2 days and stop  You may need humidified oxygen  ( if not better on room humidifier with dry nasal passage)  Call if not better in a few days call for referral to [facility]    08/25/2024  f/u ov/[place] office/Virginia [name] re: COPD GOLD 3 maint on qvar 80 x 2 and anoro   Chief Complaint  Patient presents with   COPD    Shob - cough   Dyspnea:  nl pace at [employer] Cough: daytime / noct dry mucus build up / not using humidifier yet  Sleeping: flat bed / one pillow nasal dryness  and not using humifier yet on 02 SABA use: rarely  02: 3lpm hs and prn daytime      No obvious day to day or daytime variability or assoc excess/ purulent sputum or mucus plugs or hemoptysis or cp or chest tightness, subjective wheeze or overt sinus or hb symptoms.    Also denies any obvious fluctuation of symptoms with weather or environmental changes or other aggravating or alleviating factors except as outlined above   No unusual exposure hx or h/o  childhood pna/ asthma or knowledge of premature birth.  Current Allergies, Complete Past Medical History, Past Surgical History, Family History, and Social History were reviewed in [name] record.  ROS  The following are not active complaints unless bolded Hoarseness, sore throat, dysphagia, dental problems, itching, sneezing,  nasal congestion or discharge of excess mucus or purulent secretions, ear ache,   fever, chills, sweats, unintended wt loss or wt gain, classically pleuritic or exertional cp,  orthopnea pnd or arm/hand swelling  or leg swelling, presyncope, palpitations, abdominal pain, anorexia, nausea, vomiting, diarrhea  or change in bowel habits or change in bladder habits, change in stools or change in urine, dysuria, hematuria,  rash, arthralgias, visual complaints, headache, numbness, weakness or ataxia or problems with walking or coordination,  change in mood or  memory.        Current Meds  Medication Sig   albuterol  (VENTOLIN  HFA) 108 (90 Base) MCG/ACT inhaler Inhale 1 puff into the lungs every 6 (six) hours as needed for wheezing or shortness of breath.   beclomethasone (QVAR REDIHALER) 80 MCG/ACT inhaler INHALE 2 PUFFS INTO THE LUNGS TWICE DAILY   budesonide-glycopyrrolate-formoterol (BREZTRI  AEROSPHERE) 160-9-4.8 MCG/ACT AERO inhaler Inhale 2 puffs into the lungs in the morning and at bedtime for 1 day.   chlorpheniramine (CHLOR-TRIMETON) 4 MG tablet Take 4 mg by mouth at bedtime.   dextromethorphan-guaiFENesin (MUCINEX DM) 30-600 MG 12hr tablet Take 1 tablet by mouth daily at 6 (six) AM.   fexofenadine (ALLEGRA) 180 MG tablet Take 180 mg by mouth daily.   latanoprost (XALATAN) 0.005 % ophthalmic solution 1 drop at bedtime.   magnesium oxide (MAG-OX) 400 MG tablet Take 400 mg by mouth daily.   montelukast  (SINGULAIR ) 10 MG tablet TAKE 1 TABLET(10 MG) BY MOUTH AT BEDTIME   triamcinolone  ointment (KENALOG ) 0.1 % Apply 1 Application topically 2 (two)  times daily as needed (rash flare). Do not use on the face, neck, armpits or groin area. Do not use more than 3 weeks in a row.   umeclidinium-vilanterol (ANORO ELLIPTA ) 62.5-25 MCG/ACT AEPB Inhale 1 puff into the lungs daily.   valACYclovir  (VALTREX ) 500  MG tablet 2 tablets three times daily   valACYclovir  (VALTREX ) 500 MG tablet Take 500 mg by mouth 2 (two) times daily.   [DISCONTINUED] predniSONE  (DELTASONE ) 10 MG tablet Take  4 each am x 2 days,   2 each am x 2 days,  1 each am x 2 days and stop   [DISCONTINUED] predniSONE  (DELTASONE ) 10 MG tablet 4 x 2 days ,2 x 2 days, 1 x 2 days, then stop   [DISCONTINUED] predniSONE  (DELTASONE ) 10 MG tablet 4 x 2 days, 2 x 2 days, 1 x 2 days, then stop        Past Medical History:  Diagnosis Date   Allergic    Asthma    COPD (chronic obstructive pulmonary disease) (HCC)    Osteopenia    Palpitations       Objective:    Wts  08/25/2024          107   03/05/2024        110  02/25/2024       109  12/17/2023     113    09/10/23 117 lb (53.1 kg)  08/21/23 112 lb 9.6 oz (51.1 kg)  06/26/23 113 lb (51.3 kg)    Vital signs reviewed  08/25/2024  - Note at rest 02 sats  93% on RA   General appearance:   thin amb wf nad   HEENT : Oropharynx  clear   Nasal turbinates nl    NECK :  without  apparent JVD/ palpable Nodes/TM    LUNGS: no acc muscle use,  Mild barrel/scolitic  contour chest wall with bilateral  Distant bs s audible wheeze and  without cough on insp or exp maneuvers  and mild  Hyperresonant  to  percussion bilaterally    CV:  RRR  no s3 or murmur or increase in P2, and no edema   ABD:  soft and nontender with pos end  insp Hoover's  in the supine position.  No bruits or organomegaly appreciated   MS:  Nl gait/ ext warm without deformities Or obvious joint restrictions  calf tenderness, cyanosis or clubbing     SKIN: warm and dry without lesions    NEURO:  alert, approp, nl sensorium with  no motor or cerebellar deficits apparent.          Assessment     Assessment & Plan COPD GOLD 3 Quit smoking 2003 - PFTS 2021 showed ratio 41%, FEV1 0.81L (40%), FVC 1.88L (69%), no significant bronchodilator response. DLCO 33%  - 09/10/2023  anoro/qvar if tol - 12/17/2023  After extensive coaching inhaler device,  effectiveness =    75% from a baseline of < 50% with hfa  >>>    08/25/2024  After extensive coaching inhaler device,  effectiveness =    75% (short ti with HFA) try breztri  x 2 week samples fo see if daytime cough improves    Group D (now reclassified as E) in terms of symptom/risk and laba/lama/ICS  therefore appropriate rx at this point >>>  breztri  with approp saba trial if can master hfa and this helps her daytime cough    Chronic respiratory failure with hypoxia (HCC) As of 03/05/2024  using 02 3lpm at hs  and prn daytime    >>>Reviewed need to use humdity for 02 at hs    >>> Reminded goal is to keep sats > 90% esp at places like dept store walking   -  see avs for instructions unique  to this ov          Each maintenance medication was reviewed in detail including emphasizing most importantly the difference between maintenance and prns and under what circumstances the prns are to be triggered using an action plan format where appropriate.  Total time for H and P, chart review, counseling, reviewing hfa/neb/ 02 /pulse ox  device(s) and generating customized AVS unique to this office visit / same day charting = 30 min         AVS  Patient Instructions  Try breztri  one puff 1st thing in am and 12 hours later to see if you tolerate it and if so can try breztri  2 puffs in am and just one in pm  If any problem with high heart rate then just try one puff 1st thing in am   Stop anoro and qvar while on Breztri    Work on inhaler technique:  relax and gently blow all the way out then take a nice smooth full deep breath back in, triggering the inhaler at same time you start breathing in.  Hold breath in for at  least  5 seconds if you can. Blow out breztri  thru nose. Rinse and gargle with water when done.  If mouth or throat bother you at all,  try brushing teeth/gums/tongue with arm and hammer toothpaste/ make a slurry and gargle and spit out.   >>>  Remember how golfers warm up by taking practice swings - do this with an empty inhaler   Make sure you check your oxygen  saturation  AT  your highest level of activity (not after you stop)   to be sure it stays over 90% and adjust  02 flow upward to maintain this level if needed but remember to turn it back to previous settings when you stop (to conserve your supply).    Please schedule a follow up visit in 3 months but call sooner if needed        Ozell America, MD 08/25/2024

## 2024-08-25 ENCOUNTER — Ambulatory Visit: Admitting: Internal Medicine

## 2024-08-25 ENCOUNTER — Encounter: Payer: Self-pay | Admitting: Internal Medicine

## 2024-08-25 VITALS — BP 140/78 | HR 81 | Ht 65.0 in | Wt 107.6 lb

## 2024-08-25 DIAGNOSIS — J9611 Chronic respiratory failure with hypoxia: Secondary | ICD-10-CM | POA: Diagnosis not present

## 2024-08-25 DIAGNOSIS — Z87891 Personal history of nicotine dependence: Secondary | ICD-10-CM | POA: Diagnosis not present

## 2024-08-25 DIAGNOSIS — J449 Chronic obstructive pulmonary disease, unspecified: Secondary | ICD-10-CM

## 2024-08-25 MED ORDER — BREZTRI AEROSPHERE 160-9-4.8 MCG/ACT IN AERO: 2.0000 | INHALATION_SPRAY | Freq: Two times a day (BID) | RESPIRATORY_TRACT | Status: AC

## 2024-08-25 NOTE — Patient Instructions (Addendum)
 Try breztri  one puff 1st thing in am and 12 hours later to see if you tolerate it and if so can try breztri  2 puffs in am and just one in pm  If any problem with high heart rate then just try one puff 1st thing in am   Stop anoro and qvar while on Breztri    Work on inhaler technique:  relax and gently blow all the way out then take a nice smooth full deep breath back in, triggering the inhaler at same time you start breathing in.  Hold breath in for at least  5 seconds if you can. Blow out breztri  thru nose. Rinse and gargle with water when done.  If mouth or throat bother you at all,  try brushing teeth/gums/tongue with arm and hammer toothpaste/ make a slurry and gargle and spit out.   >>>  Remember how golfers warm up by taking practice swings - do this with an empty inhaler   Make sure you check your oxygen  saturation  AT  your highest level of activity (not after you stop)   to be sure it stays over 90% and adjust  02 flow upward to maintain this level if needed but remember to turn it back to previous settings when you stop (to conserve your supply).    Please schedule a follow up visit in 3 months but call sooner if needed

## 2024-08-25 NOTE — Assessment & Plan Note (Addendum)
 As of 03/05/2024  using 02 3lpm at hs  and prn daytime    >>>Reviewed need to use humdity for 02 at hs    >>> Reminded goal is to keep sats > 90% esp at places like dept store walking   -  see avs for instructions unique to this ov          Each maintenance medication was reviewed in detail including emphasizing most importantly the difference between maintenance and prns and under what circumstances the prns are to be triggered using an action plan format where appropriate.  Total time for H and P, chart review, counseling, reviewing hfa/neb/ 02 /pulse ox  device(s) and generating customized AVS unique to this office visit / same day charting = 30 min

## 2024-08-25 NOTE — Assessment & Plan Note (Addendum)
 Quit smoking 2003 - PFTS 2021 showed ratio 41%, FEV1 0.81L (40%), FVC 1.88L (69%), no significant bronchodilator response. DLCO 33%  - 09/10/2023  anoro/qvar if tol - 12/17/2023  After extensive coaching inhaler device,  effectiveness =    75% from a baseline of < 50% with hfa  >>>    08/25/2024  After extensive coaching inhaler device,  effectiveness =    75% (short ti with HFA) try breztri  x 2 week samples fo see if daytime cough improves    Group D (now reclassified as E) in terms of symptom/risk and laba/lama/ICS  therefore appropriate rx at this point >>>  breztri  with approp saba trial if can master hfa and this helps her daytime cough

## 2024-09-02 ENCOUNTER — Telehealth: Payer: Self-pay

## 2024-09-02 MED ORDER — BREZTRI AEROSPHERE 160-9-4.8 MCG/ACT IN AERO: 2.0000 | INHALATION_SPRAY | Freq: Two times a day (BID) | RESPIRATORY_TRACT | 3 refills | Status: DC

## 2024-09-02 MED ORDER — BREZTRI AEROSPHERE 160-9-4.8 MCG/ACT IN AERO: 2.0000 | INHALATION_SPRAY | Freq: Two times a day (BID) | RESPIRATORY_TRACT | Status: DC

## 2024-09-02 NOTE — Telephone Encounter (Signed)
 Spoke with patient as she has lost her breztri  sample will place one at the front and says it has been working taking it 1 puff 2 times daily,will place script to pharmacy sending to Dr.Wert as a FYI

## 2024-09-26 ENCOUNTER — Telehealth: Payer: Self-pay

## 2024-09-26 NOTE — Telephone Encounter (Signed)
 Copied from CRM [id number]. Topic: Clinical - Refused Triage >> Sep 26, 2024  4:40 PM Devaughn RAMAN wrote: Patient/caller voiced complaints of shortness of breath. Declined transfer to triage.

## 2024-09-26 NOTE — Telephone Encounter (Signed)
 Copied from CRM [id number]. Topic: Clinical - Order For Equipment >> Sep 24, 2024 11:42 AM Essie A wrote: Reason for CRM: Toy from [facility] called because the patient called them to place an order a light weight portable oxygen  concentrator.  She will send an order by fax.  Thanks.  ATC x1- vm not set up so unable to leave vm - Pt was not walked for POC lov. - office visit is needed for POC qualification.

## 2024-09-29 ENCOUNTER — Telehealth: Payer: Self-pay

## 2024-09-29 DIAGNOSIS — J449 Chronic obstructive pulmonary disease, unspecified: Secondary | ICD-10-CM

## 2024-09-29 NOTE — Telephone Encounter (Signed)
 Copied from CRM [id number]. Topic: Clinical - Order For Equipment >> Sep 24, 2024 11:42 AM Essie A wrote: Reason for CRM: Toy from [facility] called because the patient called them to place an order a light weight portable oxygen  concentrator.  She will send an order by fax.  Thanks. >> Sep 29, 2024  9:56 AM Rozanna G wrote: Pt returning call to clinic about portable oxygen  tank.   Pt has not qualified for a poc - will need appt

## 2024-09-30 ENCOUNTER — Telehealth: Payer: Self-pay

## 2024-09-30 ENCOUNTER — Ambulatory Visit: Admitting: Internal Medicine

## 2024-09-30 ENCOUNTER — Encounter: Payer: Self-pay | Admitting: Internal Medicine

## 2024-09-30 VITALS — BP 172/92 | HR 91 | Ht 65.0 in | Wt 107.2 lb

## 2024-09-30 DIAGNOSIS — J9611 Chronic respiratory failure with hypoxia: Secondary | ICD-10-CM

## 2024-09-30 DIAGNOSIS — J449 Chronic obstructive pulmonary disease, unspecified: Secondary | ICD-10-CM | POA: Diagnosis not present

## 2024-09-30 NOTE — Assessment & Plan Note (Addendum)
 As of 03/05/2024  using 02 3lpm at hs  and prn daytime    - 09/30/2024   Walked on RA  x  2  lap(s) =  approx 300  ft  @ mod pace, stopped due to desats to mid 80s then placed on 3lpm POC  and completed one more lap at same pace withouth desats   >>>   09/30/2024 referred DMI for lightest wt POC than can give her 90% or more (limited by scoliosis from much carrying at all)   Each maintenance medication was reviewed in detail including emphasizing most importantly the difference between maintenance and prns and under what circumstances the prns are to be triggered using an action plan format where appropriate.  Total time for H and P, chart review, counseling, reviewing hfa/02 / pulse ox  device(s) , directly observing portions of ambulatory 02 saturation study/ and generating customized AVS unique to this office visit / same day charting = 42 min

## 2024-09-30 NOTE — Progress Notes (Signed)
 Virginia [name], female    DOB: 1941/08/25   MRN: [id number]   Brief patient profile:  47  yowf quit smoking 2003 with sinus problems since age 83 self- referred to pulmonary clinic 08/21/2023 for acute visit with need for inhalers x 2019 mostly with exertion (Golf) with pos allergy testing  remotely   Virginia [name] pt PFTS 2021 showed ratio 41%, FEV1 0.81L (40%), FVC 1.88L (69%), no significant bronchodilator response. DLCO 33%    History of Present Illness  08/21/2023  Pulmonary/ 1st [name] eval/Virginia [name] on Trelegy had been anoro  Chief Complaint  Patient presents with   Acute Visit    Cough x 6 weeks.  Prednisone  called in by APP did not help sx.  Dyspnea:  pushes cart fine hamricks Cough: dry raspy feels something stuck in throat < one tsp  Sleep: fine flat bed  SABA use: last used pm one day prior to eval  Rec Plan A = Automatic = Always=    Anoro one click each am then Either qvar or asmanex  100 2 puffs each AM Prednisone  10 mg take  4 each am x 2 days,   2 each am x 2 days,  1 each am x 2 days and stop  Plan B = Backup (to supplement plan A, not to replace it) Only use your albuterol  inhaler as a rescue medication  Work on inhaler technique: Please remember to go to the  x-ray department  for your tests - we will call you with the results when they are available     09/10/2023  f/u ov/ [name]/Virginia [name] re: GOLD 3  maint on anoro /singulair   need f/u cxr or   Chief Complaint  Patient presents with   Follow-up    6 month follow up  Dyspnea:  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 on a flat grade s sob but does fine slow and flat  Cough: improved p prednisone  and has not recurred off trelegy  Sleeping: flat bed on pillow s  resp cc  SABA use: 3 x daily  02: 3lpm HS  and not titrating daytime Hoarseness better off trelegy but hasn't started qvar yet  Rec Make sure you check your oxygen  saturation  AT  your highest level of activity (not after you stop)   to be sure it stays over 90%   Plan A = Automatic = Always=    Anoro one click  each am and qvar 2 puffs to follow  Work on inhaler technique:    Plan B = Backup (to supplement plan A, not to replace it) Only use your albuterol  inhaler as a rescue medication Plan C = Crisis (instead of Plan B but only if Plan B stops working) - Prednisone  10 mg take  4 each am x 2 days,   2 each am x 2 days,  1 each am x 2 days and stop   cxray >>> c/w copd only   Please schedule a follow up visit in 3 months but call sooner if needed - bring inhalers         03/05/2024  ACUTE   ov/Virginia [name] re: GOLD 3  copd    maint on anoro / qvar   Chief Complaint  Patient presents with   Acute Visit    Cough, nasal congestion, right ear pain x 2 days   Dyspnea:  feels more sob when nose clogged up /  can't use sudaphed products  Cough: white  Sleeping: flat bed one  pillow due to nasal congestion  SABA use: rarely 02: 3lpm hs / dry  Rec Prednisone  10 mg take  4 each am x 2 days,   2 each am x 2 days,  1 each am x 2 days and stop  You may need humidified oxygen  ( if not better on room humidifier with dry nasal passage)  Call if not better in a few days call for referral to Virginia [name]    08/25/2024  f/u ov/Virginia [name]/Virginia [name] re: COPD GOLD 3 maint on qvar 80 x 2 and anoro   Chief Complaint  Patient presents with   COPD    Shob - cough   Dyspnea:  nl pace at [employer] Cough: daytime / noct dry mucus build up / not using humidifier yet  Sleeping: flat bed / one pillow nasal dryness  and not using humifier yet on 02 SABA use: rarely  02: 3lpm hs and prn daytime Rec Try breztri  one puff 1st thing in am and 12 hours later to see if you tolerate it and if so can try breztri  2 puffs in am and just one in pm If any problem with high heart rate then just try one puff 1st thing in am  Stop anoro and qvar while on Breztri   Work on inhaler technique: >>>  Remember how golfers warm up by taking practice swings - do this with an empty inhaler  Make  sure you check your oxygen  saturation  AT  your highest level of activity (not after you stop)   to be sure it stays over 90%  Please schedule a follow up visit in 3 months but call sooner if needed    09/30/2024  ACUTE  ov/Virginia [name]/Virginia [name] re: GOLD 3  maint on breztri  one twice daily   Chief Complaint  Patient presents with   Acute Visit     med management   Dyspnea:  feels she's losing ground /  still doing hamricks not typically with 02 too heavy  Cough: none  Sleeping: flat bed/ one pillow    resp cc  SABA use: one puff at a time  02: 3lpm hs  then prn daytime     No obvious day to day or daytime variability or assoc excess/ purulent sputum or mucus plugs or hemoptysis or cp or chest tightness, subjective wheeze or overt sinus or hb symptoms.    Also denies any obvious fluctuation of symptoms with weather or environmental changes or other aggravating or alleviating factors except as outlined above   No unusual exposure hx or h/o childhood pna/ asthma or knowledge of premature birth.  Current Allergies, Complete Past Medical History, Past Surgical History, Family History, and Social History were reviewed in [name] record.  ROS  The following are not active complaints unless bolded Hoarseness, sore throat, dysphagia, dental problems, itching, sneezing,  nasal congestion or discharge of excess mucus or purulent secretions, ear ache,   fever, chills, sweats, unintended wt loss or wt gain, classically pleuritic or exertional cp,  orthopnea pnd or arm/hand swelling  or leg swelling, presyncope, palpitations, abdominal pain, anorexia, nausea, vomiting, diarrhea  or change in bowel habits or change in bladder habits, change in stools or change in urine, dysuria, hematuria,  rash, arthralgias, visual complaints, headache, numbness, weakness or ataxia or problems with walking or coordination,  change in mood or  memory.        Current Meds  Medication Sig    albuterol  (VENTOLIN  HFA)  108 (90 Base) MCG/ACT inhaler Inhale 1 puff into the lungs every 6 (six) hours as needed for wheezing or shortness of breath.   budesonide-glycopyrrolate-formoterol (BREZTRI  AEROSPHERE) 160-9-4.8 MCG/ACT AERO inhaler Inhale 2 puffs into the lungs in the morning and at bedtime.   budesonide-glycopyrrolate-formoterol (BREZTRI  AEROSPHERE) 160-9-4.8 MCG/ACT AERO inhaler Inhale 2 puffs into the lungs in the morning and at bedtime.   chlorpheniramine (CHLOR-TRIMETON) 4 MG tablet Take 4 mg by mouth at bedtime. (Patient taking differently: Take 4 mg by mouth as needed.)   dextromethorphan-guaiFENesin (MUCINEX DM) 30-600 MG 12hr tablet Take 1 tablet by mouth daily at 6 (six) AM.   fexofenadine (ALLEGRA) 180 MG tablet Take 180 mg by mouth daily.   latanoprost (XALATAN) 0.005 % ophthalmic solution 1 drop at bedtime.   magnesium oxide (MAG-OX) 400 MG tablet Take 400 mg by mouth daily.   triamcinolone  ointment (KENALOG ) 0.1 % Apply 1 Application topically 2 (two) times daily as needed (rash flare). Do not use on the face, neck, armpits or groin area. Do not use more than 3 weeks in a row.   valACYclovir  (VALTREX ) 500 MG tablet 2 tablets three times daily              Past Medical History:  Diagnosis Date   Allergic    Asthma    COPD (chronic obstructive pulmonary disease) (HCC)    Osteopenia    Palpitations       Objective:    Wts  09/30/2024      107   08/25/2024          107   03/05/2024        110  02/25/2024       109  12/17/2023     113    09/10/23 117 lb (53.1 kg)  08/21/23 112 lb 9.6 oz (51.1 kg)  06/26/23 113 lb (51.3 kg)    Vital signs reviewed  09/30/2024  - Note at rest 02 sats  91% on RA   General appearance:    pleasant mod anxious amb wf nad    HEENT : Oropharynx  clear      NECK :  without  apparent JVD/ palpable Nodes/TM    LUNGS: no acc muscle use,  Mild barrel/scoliotic   contour chest wall with bilateral  Distant bs s audible wheeze and   without cough on insp or exp maneuvers  and mild  Hyperresonant  to  percussion bilaterally     CV:  RRR  no s3 or murmur or increase in P2, and no edema   ABD:  soft and nontender   MS:  Nl gait/ ext warm without deformities Or obvious joint restrictions  calf tenderness, cyanosis or clubbing     SKIN: warm and dry without lesions    NEURO:  alert, approp, nl sensorium with  no motor or cerebellar deficits apparent.     Assessment     Assessment & Plan COPD GOLD 3 Quit smoking 2003 - PFTS 2021 showed ratio 41%, FEV1 0.81L (40%), FVC 1.88L (69%), no significant bronchodilator response. DLCO 33%  - 09/10/2023  anoro/qvar if tol > change to Breztri  one bid titrate up 08/25/24    >>>  09/30/2024  After extensive coaching inhaler device,  effectiveness =  75% (with short Ti ) > continue breztri  2bid if tol    Group E in terms of symptoms/risk so  laba/lama/ICS  therefore appropriate rx at this point >>>  breztri  up to 2 bid if  tolerated   and approp SABA prn.  Re SABA :  I spent extra time with pt today reviewing appropriate use of albuterol  for prn use on exertion with the following points: 1) saba is for relief of sob that does not improve by walking a slower pace or resting but rather if the pt does not improve after trying this first. 2) If the pt is convinced, as many are, that saba helps recover from activity faster then it's easy to tell if this is the case by re-challenging : ie stop, take the inhaler, then p 5 minutes try the exact same activity (intensity of workload) that just caused the symptoms and see if they are substantially diminished or not after saba 3) if there is an activity that reproducibly causes the symptoms, try the saba 15 min before the activity on alternate days   If in fact the saba really does help, then fine to continue to use it prn but advised may need to look closer at the maintenance (breztri  vs qvar/anoro)  regimen being used to achieve better control of  airways disease with exertion.     Chronic respiratory failure with hypoxia (HCC) As of 03/05/2024  using 02 3lpm at hs  and prn daytime    - 09/30/2024   Walked on RA  x  2  lap(s) =  approx 300  ft  @ mod pace, stopped due to desats to mid 80s then placed on 3lpm POC  and completed one more lap at same pace withouth desats   >>>   09/30/2024 referred DMI for lightest wt POC than can give her 90% or more (limited by scoliosis from much carrying at all)   Each maintenance medication was reviewed in detail including emphasizing most importantly the difference between maintenance and prns and under what circumstances the prns are to be triggered using an action plan format where appropriate.  Total time for H and P, chart review, counseling, reviewing hfa/02 / pulse ox  device(s) , directly observing portions of ambulatory 02 saturation study/ and generating customized AVS unique to this [name] visit / same day charting = 42 min                  AVS  Patient Instructions  Work on inhaler technique:  relax and gently blow all the way out then take a nice smooth full deep breath back in, triggering the inhaler at same time you start breathing in.  Hold breath in for at least  5 seconds if you can. Blow out breztir  thru nose. Rinse and gargle with water when done.  If mouth or throat bother you at all,  try brushing teeth/gums/tongue with arm and hammer toothpaste/ make a slurry and gargle and spit out.   Breztri  2 each am and 1 in 12 hours   - if  doing ok on it after a week then increase to  2 every 12 hours  Keep your follow up appt in December - call sooner if needed               Ozell America, MD 09/30/2024

## 2024-09-30 NOTE — Telephone Encounter (Signed)
 Copied from CRM [id number]. Topic: Clinical - Refused Triage >> Sep 26, 2024  4:40 PM Virginia [name] wrote: Patient/caller voiced complaints of shortness of breath. Declined transfer to triage.    Patient was seen in office on 09/30/24   -NFN

## 2024-09-30 NOTE — Patient Instructions (Addendum)
 Work on inhaler technique:  relax and gently blow all the way out then take a nice smooth full deep breath back in, triggering the inhaler at same time you start breathing in.  Hold breath in for at least  5 seconds if you can. Blow out breztir  thru nose. Rinse and gargle with water when done.  If mouth or throat bother you at all,  try brushing teeth/gums/tongue with arm and hammer toothpaste/ make a slurry and gargle and spit out.   Breztri  2 each am and 1 in 12 hours   - if  doing ok on it after a week then increase to  2 every 12 hours  DME request for lighter POC but must be able to do 3lpm POC and may need more formal study  Make sure you check your oxygen  saturation  AT  your highest level of activity (not after you stop)   to be sure it stays over 90% and adjust  02 flow upward to maintain this level if needed but remember to turn it back to previous settings when you stop (to conserve your supply).   Keep your follow up appt in December - call sooner if needed

## 2024-09-30 NOTE — Assessment & Plan Note (Addendum)
 Quit smoking 2003 - PFTS 2021 showed ratio 41%, FEV1 0.81L (40%), FVC 1.88L (69%), no significant bronchodilator response. DLCO 33%  - 09/10/2023  anoro/qvar if tol > change to Breztri  one bid titrate up 08/25/24    >>>  09/30/2024  After extensive coaching inhaler device,  effectiveness =  75% (with short Ti ) > continue breztri  2bid if tol    Group E in terms of symptoms/risk so  laba/lama/ICS  therefore appropriate rx at this point >>>  breztri  up to 2 bid if tolerated   and approp SABA prn.  Re SABA :  I spent extra time with pt today reviewing appropriate use of albuterol  for prn use on exertion with the following points: 1) saba is for relief of sob that does not improve by walking a slower pace or resting but rather if the pt does not improve after trying this first. 2) If the pt is convinced, as many are, that saba helps recover from activity faster then it's easy to tell if this is the case by re-challenging : ie stop, take the inhaler, then p 5 minutes try the exact same activity (intensity of workload) that just caused the symptoms and see if they are substantially diminished or not after saba 3) if there is an activity that reproducibly causes the symptoms, try the saba 15 min before the activity on alternate days   If in fact the saba really does help, then fine to continue to use it prn but advised may need to look closer at the maintenance (breztri  vs qvar/anoro)  regimen being used to achieve better control of airways disease with exertion.

## 2024-10-01 NOTE — Telephone Encounter (Signed)
 Pt seen Dr Darlean in clinic on 09/30/24

## 2024-10-02 NOTE — Telephone Encounter (Signed)
 Copied from CRM [id number]. Topic: General - Other >> Sep 29, 2024 11:53 AM Virginia [name] wrote: Reason for CRM: Patient requesting Marcus disregarding her request for call  Patient was seen in office 09/30/2024

## 2024-10-10 NOTE — Telephone Encounter (Signed)
 Copied from CRM [id number]. Topic: General - Other >> Oct 10, 2024 12:34 PM Devaughn RAMAN wrote: Reason for CRM: Patient called in regarding Portable Oxygen  Concentrator and inquire if the lighter weight concentrator if there has been any response for this request.  Sending Community message to [employer]. Per 10/14 referral order was received by [facility] on 10/15.  Will await response.

## 2024-10-13 NOTE — Telephone Encounter (Signed)
 Response from [facility]  Virginia [name] Virginia [name]; [facility], Marcus LABOR, CMA; Joylene Cain; Tucker, Dolanda; Sheree, Mitchell Pt stated, She wants one smaller than what she has ( we only offer one size) She states she asked for the order to be sent to [facility] because they use different vendors as needed. I have tried reaching out to get this scheduled with no answer, i will reach out again today.

## 2024-10-13 NOTE — Telephone Encounter (Signed)
 Order has been received by [facility] and they are aware it needs to be sent to [facility]

## 2024-10-13 NOTE — Telephone Encounter (Signed)
 Virginia [name] at [facility] is going to send the order to [place].

## 2024-10-13 NOTE — Telephone Encounter (Signed)
 I have sent the order to [facility] who will then forward all needed documentation to [facility].

## 2024-10-13 NOTE — Addendum Note (Signed)
 Addended byBETHA FRIES, Emiliana Blaize A on: 10/13/2024 01:14 PM   Modules accepted: Orders

## 2024-10-13 NOTE — Telephone Encounter (Signed)
 Per the [place] due to the pts insurance the order cannot be sent to [facility] and that [facility] is the one who will need to send the order to [facility]. I have sent a message regarding this to [facility] so they can send the order to where it needs to go.

## 2024-10-13 NOTE — Telephone Encounter (Signed)
 Called and spoke with the pt. She states her POC is to heavy since she has lost weight & has scoliosis. I have spoken with [facility] and the pts order will need to be sent to a different DME company (either [address] or [employer]) so the pt can obtain a lighter POC. [facility] does not make other options.   I am placing new order & pt is aware.   [facility] please sent the pts order to somewhere besides [facility].  The pt needs a lighter POC.

## 2024-10-29 ENCOUNTER — Telehealth: Payer: Self-pay

## 2024-10-29 NOTE — Telephone Encounter (Signed)
 Copied from CRM #[id number]. Topic: Clinical - Order For Equipment >> Oct 24, 2024  4:50 PM Virginia [name] wrote: Reason for CRM: Pt request to get a message to Dr Darlean, stated [facility] did not have a small POC for her, and [facility] is going to send him a fax requesting a new POC request, for a smaller POC . she is requesting 4lbs or smaller.  Received fax today 10/29/2024 - I have printed off papers needed and ready for Dr Darlean signature then faxing back

## 2024-11-19 ENCOUNTER — Telehealth: Payer: Self-pay

## 2024-11-19 NOTE — Telephone Encounter (Signed)
 Paperwork faxed to [facility]

## 2024-11-21 ENCOUNTER — Telehealth: Payer: Self-pay

## 2024-11-21 DIAGNOSIS — J441 Chronic obstructive pulmonary disease with (acute) exacerbation: Secondary | ICD-10-CM

## 2024-11-21 DIAGNOSIS — J439 Emphysema, unspecified: Secondary | ICD-10-CM

## 2024-11-21 MED ORDER — UMECLIDINIUM-VILANTEROL 62.5-25 MCG/ACT IN AEPB: 1.0000 | INHALATION_SPRAY | Freq: Every day | RESPIRATORY_TRACT | 11 refills | Status: DC

## 2024-11-21 MED ORDER — QVAR REDIHALER 80 MCG/ACT IN AERB: 2.0000 | INHALATION_SPRAY | Freq: Two times a day (BID) | RESPIRATORY_TRACT | 11 refills | Status: DC

## 2024-11-21 NOTE — Telephone Encounter (Signed)
 pt has been calling for a couple days, e2c2 said she was pretty upset because needs her appt moved and she is asking about being worked in Dec 10th because that's the only day she is available and she goes out of town after that. It has something to do with her medication  Patient is having trouble with her inhaler - the first week it was working fine, then it started just puffing out air. Patient has already spoken with the manufacturer - she cant make it to her appointment on 12/9  due to her son having to work and not being able to bring her. Patient would like to know what she can do.  Asking if we can double book you on the 10th for this pt - please advise

## 2024-11-21 NOTE — Telephone Encounter (Signed)
 Called to inform pt of the new in inhalers dr wert wants me to send in - she confirmed understanding   Pt will need appt canceled for the 9th

## 2024-11-21 NOTE — Telephone Encounter (Signed)
 Copied from CRM #[id number]. Topic: Clinical - Medical Advice >> Nov 20, 2024  8:53 AM Isabell A wrote: Reason for CRM: Patient is having trouble with her inhaler - the first week it was working fine, then it started just puffing out air. Patient has already spoken with the manufacturer - she cant make it to her appointment on 12/9  due to her son having to work and not being able to bring her. Patient would like to know what she can do.   Callback number: [phone] >> Nov 21, 2024 10:59 AM Benton KIDD wrote: Patient is calling back because she needs to speak with doctor about medication patient is very upset . Wanting to speak with someone . Please give patient a call . Not a lot of triage nurses available so its taking a little longer for someone to reach out . >> Nov 20, 2024  4:57 PM Rilla B wrote: Patient states her son is not available to bring her to appt on 12/09 and wants to know if there is any way Dr Darlean can see her on 12/08.  States she just needs to talk with him regarding the inhaler. The last 2 inhalers have been a problem. The morning puff is just a puff of air and sometimes the second puff does not work. States she is not getting the medication. Please call patient @ [phone].   This message was not in my CRMs had front staff rout this to me unsure of the mess up -- have informed dr wert pt is asking to be booked on the 10th and if ok to DB

## 2024-11-25 ENCOUNTER — Ambulatory Visit: Admitting: Internal Medicine

## 2024-12-02 NOTE — Telephone Encounter (Signed)
 Copied from CRM #[id number]. Topic: Clinical - Prescription Issue >> Dec 02, 2024 12:14 PM Joesph PARAS wrote: Reason for CRM: Patient states pharmacy only had Quvar and not the Anoro. [employer] is replacing a Breztri  inhaler for the patient. Patient wants to use the Breztri , as the Marton is not working. Please advise patient.  Please advise.

## 2024-12-04 ENCOUNTER — Other Ambulatory Visit: Payer: Self-pay

## 2024-12-04 MED ORDER — BREZTRI AEROSPHERE 160-9-4.8 MCG/ACT IN AERO: 2.0000 | INHALATION_SPRAY | Freq: Two times a day (BID) | RESPIRATORY_TRACT | 3 refills | Status: AC

## 2024-12-04 NOTE — Telephone Encounter (Signed)
 Copied from CRM #[id number]. Topic: Clinical - Medication Question >> Dec 03, 2024  9:24 AM Devaughn RAMAN wrote: Reason for CRM: Pt calling in regards to Breztri  medication. Pt stated she has been calling regarding her medication. Pt stated Breztri  gave her heart issues a few years ago and she cannot take that medication. Pt stated she started out doing 1 and 1 puffs instead of 2 and 2 for Breztri .Advised pt of message per Dr.Wert for Breztri . Pt stated she was not on 2 and 2 puffs, she was on 1 and 1. Pt is very upset and would like to speak with someone.  See phone note

## 2024-12-04 NOTE — Telephone Encounter (Signed)
 Pt states that she doesn't think that Dr Darlean remembers that pt was originally doing 2 puffs in the am and 1 puff 12 hours later she wants to know if she should be doing that dosage instead of 2 and 2 - states the reason her dosage before was 2 and 1 was bc of your heart problem she had and Wert said to work up her dosage. Wants to confirm how many doses a day she needs.   Also wants to know wants to know if its okay for her to start back Flonase  at night time again.

## 2024-12-09 NOTE — Telephone Encounter (Signed)
 Okay noted on the inhaler recs but she also wants to know if she can start using Flonase 

## 2024-12-17 ENCOUNTER — Encounter: Payer: Self-pay | Admitting: Internal Medicine

## 2024-12-17 ENCOUNTER — Ambulatory Visit: Admitting: Internal Medicine

## 2024-12-17 VITALS — BP 156/74 | HR 86 | Ht 65.0 in | Wt 107.0 lb

## 2024-12-17 DIAGNOSIS — J441 Chronic obstructive pulmonary disease with (acute) exacerbation: Secondary | ICD-10-CM | POA: Diagnosis not present

## 2024-12-17 DIAGNOSIS — J9611 Chronic respiratory failure with hypoxia: Secondary | ICD-10-CM | POA: Diagnosis not present

## 2024-12-17 DIAGNOSIS — J449 Chronic obstructive pulmonary disease, unspecified: Secondary | ICD-10-CM

## 2024-12-17 DIAGNOSIS — J439 Emphysema, unspecified: Secondary | ICD-10-CM

## 2024-12-17 NOTE — Assessment & Plan Note (Addendum)
 As of 03/05/2024  using 02 3lpm at hs  and prn daytime    - 09/30/2024   Walked on RA  x  2  lap(s) =  approx 300  ft  @ mod pace, stopped due to desats to mid 80s then placed on 3lpm POC  and completed one more lap at same pace withouth desats  - 09/30/2024 referred DMI for lightest wt POC than can give her 90% or more (limited by scoliosis from much carrying at all)  - 12/17/2024 well compensated on present POC, reminded to titrate to sats > 90%

## 2024-12-17 NOTE — Patient Instructions (Signed)
 No change in medications    Please schedule a follow up visit in 6  months but call sooner if needed

## 2024-12-17 NOTE — Progress Notes (Signed)
 "  Virginia [name], female    DOB: [birth year]   MRN: [id number]   Brief patient profile:  5  yowf quit smoking 2003 with sinus problems since age 83 self- referred to pulmonary clinic 08/21/2023 for acute visit with need for inhalers x 2019 mostly with exertion (Golf) with pos allergy testing  remotely   Dr Kara pt PFTS 2021 showed ratio 41%, FEV1 0.81L (40%), FVC 1.88L (69%), no significant bronchodilator response. DLCO 33%    History of Present Illness  08/21/2023  Pulmonary/ 1st office eval/Virginia [name] on Trelegy had been anoro  Chief Complaint  Patient presents with   Acute Visit    Cough x 6 weeks.  Prednisone  called in by APP did not help sx.  Dyspnea:  pushes cart fine hamricks Cough: dry raspy feels something stuck in throat < one tsp  Sleep: fine flat bed  SABA use: last used pm one day prior to eval  Rec Plan A = Automatic = Always=    Anoro one click each am then Either qvar  or asmanex  100 2 puffs each AM Prednisone  10 mg take  4 each am x 2 days,   2 each am x 2 days,  1 each am x 2 days and stop  Plan B = Backup (to supplement plan A, not to replace it) Only use your albuterol  inhaler as a rescue medication  Work on inhaler technique: Please remember to go to the  x-ray department  for your tests - we will call you with the results when they are available     09/10/2023  f/u ov/[place] office/Virginia [name] re: GOLD 3  maint on anoro /singulair   need f/u cxr or   Chief Complaint  Patient presents with   Follow-up    6 month follow up  Dyspnea:  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 on a flat grade s sob but does fine slow and flat  Cough: improved p prednisone  and has not recurred off trelegy  Sleeping: flat bed on pillow s  resp cc  SABA use: 3 x daily  02: 3lpm HS  and not titrating daytime Hoarseness better off trelegy but hasn't started qvar  yet  Rec Make sure you check your oxygen  saturation  AT  your highest level of activity (not after you stop)   to be sure it stays over 90%   Plan A = Automatic = Always=    Anoro one click  each am and qvar  2 puffs to follow  Work on inhaler technique:    Plan B = Backup (to supplement plan A, not to replace it) Only use your albuterol  inhaler as a rescue medication Plan C = Crisis (instead of Plan B but only if Plan B stops working) - Prednisone  10 mg take  4 each am x 2 days,   2 each am x 2 days,  1 each am x 2 days and stop   cxray >>> c/w copd only   Please schedule a follow up visit in 3 months but call sooner if needed - bring inhalers         03/05/2024  ACUTE   ov/Virginia [name] re: GOLD 3  copd    maint on anoro / qvar    Chief Complaint  Patient presents with   Acute Visit    Cough, nasal congestion, right ear pain x 2 days   Dyspnea:  feels more sob when nose clogged up /  can't use sudaphed products  Cough: white  Sleeping: flat bed  one pillow due to nasal congestion  SABA use: rarely 02: 3lpm hs / dry  Rec Prednisone  10 mg take  4 each am x 2 days,   2 each am x 2 days,  1 each am x 2 days and stop  You may need humidified oxygen  ( if not better on room humidifier with dry nasal passage)  Call if not better in a few days call for referral to [facility]    08/25/2024  f/u ov/[place] office/Virginia [name] re: COPD GOLD 3 maint on qvar  80 x 2 and anoro   Chief Complaint  Patient presents with   COPD    Shob - cough   Dyspnea:  nl pace at [employer] Cough: daytime / noct dry mucus build up / not using humidifier yet  Sleeping: flat bed / one pillow nasal dryness  and not using humifier yet on 02 SABA use: rarely  02: 3lpm hs and prn daytime Rec Try breztri  one puff 1st thing in am and 12 hours later to see if you tolerate it and if so can try breztri  2 puffs in am and just one in pm If any problem with high heart rate then just try one puff 1st thing in am  Stop anoro and qvar  while on Breztri   Work on inhaler technique: >>>  Remember how golfers warm up by taking practice swings - do this with an empty inhaler  Make  sure you check your oxygen  saturation  AT  your highest level of activity (not after you stop)   to be sure it stays over 90%  Please schedule a follow up visit in 3 months but call sooner if needed    09/30/2024  ACUTE  ov/[place] office/Virginia [name] re: GOLD 3  maint on breztri  one twice daily   Chief Complaint  Patient presents with   Acute Visit     med management   Dyspnea:  feels she's losing ground /  still doing hamricks not typically with 02 too heavy  Cough: none  Sleeping: flat bed/ one pillow    resp cc  SABA use: one puff at a time  02: 3lpm hs  then prn daytime  Patient Instructions  Work on inhaler technique  Breztri  2 each am and 1 in 12 hours   - if  doing ok on it after a week then increase to  2 every 12 hours    12/17/2024  f/u ov/ office/Virginia [name] re: GOLD 3 / 02 hs and prn  maint on breztri  2 in am and 1 in pm  Chief Complaint  Patient presents with   COPD    Shob f/u  (Inhaler failure - breztri  )    Dyspnea:  hamrick's shopping on 3lpm mid 90s  Cough: none  Sleeping: flat bed / one pillow  s  resp cc  SABA use: 1 puff   4 x daily  seems to help if take  saba  before exertion   02: 3lpm hs and prn    No obvious day to day or daytime variability or assoc excess/ purulent sputum or mucus plugs or hemoptysis or cp or chest tightness, subjective wheeze or overt   hb symptoms.    Also denies any obvious fluctuation of symptoms with weather or environmental changes or other aggravating or alleviating factors except as outlined above   No unusual exposure hx or h/o childhood pna/ asthma or knowledge of premature birth.  Current Allergies, Complete Past Medical History, Past Surgical History,  Family History, and Social History were reviewed in [name] record.  ROS  The following are not active complaints unless bolded Hoarseness, sore throat, dysphagia, dental problems, itching, sneezing,  nasal congestion or discharge of excess  mucus or purulent secretions, ear ache,   fever, chills, sweats, unintended wt loss or wt gain, classically pleuritic or exertional cp,  orthopnea pnd or arm/hand swelling  or leg swelling, presyncope, palpitations, abdominal pain, anorexia, nausea, vomiting, diarrhea  or change in bowel habits or change in bladder habits, change in stools or change in urine, dysuria, hematuria,  rash, arthralgias, visual complaints, headache, numbness, weakness or ataxia or problems with walking or coordination,  change in mood or  memory.         Outpatient Medications Prior to Visit  Medication Sig Dispense Refill   albuterol  (VENTOLIN  HFA) 108 (90 Base) MCG/ACT inhaler Inhale 1 puff into the lungs every 6 (six) hours as needed for wheezing or shortness of breath.     budesonide-glycopyrrolate-formoterol (BREZTRI  AEROSPHERE) 160-9-4.8 MCG/ACT AERO inhaler Inhale 2 puffs into the lungs in the morning and at bedtime. 3 each 3   dextromethorphan-guaiFENesin (MUCINEX DM) 30-600 MG 12hr tablet Take 1 tablet by mouth daily at 6 (six) AM.     fexofenadine (ALLEGRA) 180 MG tablet Take 180 mg by mouth daily.     latanoprost (XALATAN) 0.005 % ophthalmic solution 1 drop at bedtime.     magnesium oxide (MAG-OX) 400 MG tablet Take 400 mg by mouth daily.     triamcinolone  ointment (KENALOG ) 0.1 % Apply 1 Application topically 2 (two) times daily as needed (rash flare). Do not use on the face, neck, armpits or groin area. Do not use more than 3 weeks in a row. 30 g 1   valACYclovir  (VALTREX ) 500 MG tablet 2 tablets three times daily 42 tablet 0   beclomethasone (QVAR  REDIHALER) 80 MCG/ACT inhaler Inhale 2 puffs into the lungs 2 (two) times daily. (Patient not taking: Reported on 12/17/2024) 10.6 g 11   budesonide-glycopyrrolate-formoterol (BREZTRI  AEROSPHERE) 160-9-4.8 MCG/ACT AERO inhaler Inhale 2 puffs into the lungs in the morning and at bedtime. (Patient not taking: Reported on 12/17/2024)     chlorpheniramine  (CHLOR-TRIMETON) 4 MG tablet Take 4 mg by mouth at bedtime. (Patient not taking: Reported on 12/17/2024)     montelukast  (SINGULAIR ) 10 MG tablet TAKE 1 TABLET(10 MG) BY MOUTH AT BEDTIME (Patient not taking: Reported on 12/17/2024) 30 tablet 5   umeclidinium-vilanterol (ANORO ELLIPTA ) 62.5-25 MCG/ACT AEPB Inhale 1 puff into the lungs daily. (Patient not taking: Reported on 12/17/2024) 1 each 11   valACYclovir  (VALTREX ) 500 MG tablet Take 500 mg by mouth 2 (two) times daily. (Patient not taking: Reported on 12/17/2024)     No facility-administered medications prior to visit.            Past Medical History:  Diagnosis Date   Allergic    Asthma    COPD (chronic obstructive pulmonary disease) (HCC)    Osteopenia    Palpitations       Objective:    Wts  12/17/2024      107  09/30/2024      107   08/25/2024          107   03/05/2024        110  02/25/2024       109  12/17/2023     113    09/10/23 117 lb (53.1 kg)  08/21/23 112 lb 9.6 oz (  51.1 kg)  06/26/23 113 lb (51.3 kg)    Vital signs reviewed  12/17/2024  - Note at rest 02 sats  94% on RA   General appearance:   elderly wf  amb with rollator      Mi   HEENT : Oropharynx  clear     NECK :  without  apparent JVD/ palpable Nodes/TM    LUNGS: no acc muscle use,  Mild barrel/scoliotic   contour chest wall with bilateral  Distant bs s audible wheeze and  without cough on insp or exp maneuvers  and mild  Hyperresonant  to  percussion bilaterally     CV:  RRR  no s3 or murmur or increase in P2, and no edema   ABD:  soft and nontender   MS:  Nl gait/ ext warm without deformities Or obvious joint restrictions  calf tenderness, cyanosis or clubbing     SKIN: warm and dry without lesions    NEURO:  alert, approp, nl sensorium with  no motor or cerebellar deficits apparent.         Assessment       Assessment & Plan COPD GOLD 3 Quit smoking 2003 - PFTS 2021 showed ratio 41%, FEV1 0.81L (40%), FVC 1.88L (69%), no  significant bronchodilator response. DLCO 33%  - 09/10/2023  anoro/qvar  if tol > change to Breztri  one bid titrate up 08/25/24    -  12/17/2024  After extensive coaching inhaler device,  effectiveness =    75% (short Ti) > continue breztri    Group E in terms of symptoms/risk so  laba/lama/ICS  therefore appropriate rx at this point  >>>  breztri    and approp SABA prn.  Re SABA :  I spent extra time with pt today reviewing appropriate use of albuterol  for prn use on exertion with the following points: 1) saba is for relief of sob that does not improve by walking a slower pace or resting but rather if the pt does not improve after trying this first. 2) If the pt is convinced, as many are, that saba helps recover from activity faster then it's easy to tell if this is the case by re-challenging : ie stop, take the inhaler, then p 5 minutes try the exact same activity (intensity of workload) that just caused the symptoms and see if they are substantially diminished or not after saba 3) if there is an activity that reproducibly causes the symptoms, try the saba 15 min before the activity on alternate days   If in fact the saba really does help, then fine to continue to use it prn but advised may need to look closer at the maintenance regimen being used to achieve better control of airways disease with exertion.     Chronic respiratory failure with hypoxia (HCC) As of 03/05/2024  using 02 3lpm at hs  and prn daytime    - 09/30/2024   Walked on RA  x  2  lap(s) =  approx 300  ft  @ mod pace, stopped due to desats to mid 80s then placed on 3lpm POC  and completed one more lap at same pace withouth desats  - 09/30/2024 referred DMI for lightest wt POC than can give her 90% or more (limited by scoliosis from much carrying at all)  - 12/17/2024 well compensated on present POC, reminded to titrate to sats > 90%    F/u 6 m, sooner prn   AVS  Patient Instructions  No change  in medications    Please  schedule a follow up visit in 6 months but call sooner if needed    Ozell America, MD 12/17/2024                  "

## 2024-12-17 NOTE — Assessment & Plan Note (Addendum)
 Quit smoking 2003 - PFTS 2021 showed ratio 41%, FEV1 0.81L (40%), FVC 1.88L (69%), no significant bronchodilator response. DLCO 33%  - 09/10/2023  anoro/qvar  if tol > change to Breztri  one bid titrate up 08/25/24    -  12/17/2024  After extensive coaching inhaler device,  effectiveness =    75% (short Ti) > continue breztri    Group E in terms of symptoms/risk so  laba/lama/ICS  therefore appropriate rx at this point  >>>  breztri    and approp SABA prn.  Re SABA :  I spent extra time with pt today reviewing appropriate use of albuterol  for prn use on exertion with the following points: 1) saba is for relief of sob that does not improve by walking a slower pace or resting but rather if the pt does not improve after trying this first. 2) If the pt is convinced, as many are, that saba helps recover from activity faster then it's easy to tell if this is the case by re-challenging : ie stop, take the inhaler, then p 5 minutes try the exact same activity (intensity of workload) that just caused the symptoms and see if they are substantially diminished or not after saba 3) if there is an activity that reproducibly causes the symptoms, try the saba 15 min before the activity on alternate days   If in fact the saba really does help, then fine to continue to use it prn but advised may need to look closer at the maintenance regimen being used to achieve better control of airways disease with exertion.

## 2025-01-14 ENCOUNTER — Telehealth: Payer: Self-pay

## 2025-01-14 ENCOUNTER — Ambulatory Visit: Admitting: Internal Medicine

## 2025-01-14 NOTE — Telephone Encounter (Signed)
 Copied from CRM #[id number]. Topic: Clinical - Medication Question >> Jan 08, 2025 11:00 AM Virginia [name] wrote: Reason for CRM: FYI only pt called stated she ran out of breztri  inhaler and started using the trelegy inhaler because she had it left. She stated the is just for Dr Darlean LIPS. She stated the Trelegy is working better for her. She said do not send in a prescription yet she just wants to make Dr Darlean. She wants him to know that her PCP is giving her some samples of the Trelegy. Stated she has not had to use her rescue inhaler with the Trelegy at all. Stated she has to use the rescue inhaler with the Breztri  inhaler each time.  Securely informed dr darlean
